# Patient Record
Sex: Female | Born: 1980
Health system: Southern US, Community
[De-identification: ages and names within clinical notes are randomized; demographics above are authoritative.]

## PROBLEM LIST (undated history)

## (undated) DIAGNOSIS — E039 Hypothyroidism, unspecified: Secondary | ICD-10-CM

## (undated) DIAGNOSIS — E119 Type 2 diabetes mellitus without complications: Secondary | ICD-10-CM

## (undated) DIAGNOSIS — N390 Urinary tract infection, site not specified: Secondary | ICD-10-CM

## (undated) DIAGNOSIS — B977 Papillomavirus as the cause of diseases classified elsewhere: Secondary | ICD-10-CM

## (undated) DIAGNOSIS — G4733 Obstructive sleep apnea (adult) (pediatric): Secondary | ICD-10-CM

## (undated) DIAGNOSIS — F4310 Post-traumatic stress disorder, unspecified: Secondary | ICD-10-CM

## (undated) DIAGNOSIS — F329 Major depressive disorder, single episode, unspecified: Secondary | ICD-10-CM

## (undated) DIAGNOSIS — N760 Acute vaginitis: Secondary | ICD-10-CM

## (undated) DIAGNOSIS — Z9989 Dependence on other enabling machines and devices: Secondary | ICD-10-CM

## (undated) DIAGNOSIS — B9689 Other specified bacterial agents as the cause of diseases classified elsewhere: Secondary | ICD-10-CM

## (undated) DIAGNOSIS — I10 Essential (primary) hypertension: Secondary | ICD-10-CM

## (undated) DIAGNOSIS — F32A Depression, unspecified: Secondary | ICD-10-CM

## (undated) DIAGNOSIS — A749 Chlamydial infection, unspecified: Secondary | ICD-10-CM

## (undated) HISTORY — PX: TONSILLECTOMY: SUR1361

## (undated) HISTORY — PX: WISDOM TOOTH EXTRACTION: SHX21

## (undated) HISTORY — PX: MOLE REMOVAL: SHX2046

---

## 2007-03-23 ENCOUNTER — Emergency Department (HOSPITAL_COMMUNITY): Admission: EM | Admit: 2007-03-23 | Discharge: 2007-03-23 | Payer: Self-pay | Admitting: Family Medicine

## 2007-06-10 ENCOUNTER — Emergency Department (HOSPITAL_COMMUNITY): Admission: EM | Admit: 2007-06-10 | Discharge: 2007-06-10 | Payer: Self-pay | Admitting: Emergency Medicine

## 2008-02-11 ENCOUNTER — Emergency Department (HOSPITAL_COMMUNITY): Admission: EM | Admit: 2008-02-11 | Discharge: 2008-02-11 | Payer: Self-pay | Admitting: Emergency Medicine

## 2008-02-17 ENCOUNTER — Emergency Department (HOSPITAL_COMMUNITY): Admission: EM | Admit: 2008-02-17 | Discharge: 2008-02-17 | Payer: Self-pay | Admitting: Emergency Medicine

## 2008-03-20 ENCOUNTER — Emergency Department (HOSPITAL_COMMUNITY): Admission: EM | Admit: 2008-03-20 | Discharge: 2008-03-20 | Payer: Self-pay | Admitting: Family Medicine

## 2008-04-07 ENCOUNTER — Inpatient Hospital Stay (HOSPITAL_COMMUNITY): Admission: EM | Admit: 2008-04-07 | Discharge: 2008-04-09 | Payer: Self-pay | Admitting: *Deleted

## 2008-04-07 ENCOUNTER — Emergency Department (HOSPITAL_COMMUNITY): Admission: EM | Admit: 2008-04-07 | Discharge: 2008-04-07 | Payer: Self-pay | Admitting: Emergency Medicine

## 2008-04-07 ENCOUNTER — Ambulatory Visit: Payer: Self-pay | Admitting: *Deleted

## 2008-07-15 ENCOUNTER — Emergency Department (HOSPITAL_COMMUNITY): Admission: EM | Admit: 2008-07-15 | Discharge: 2008-07-15 | Payer: Self-pay | Admitting: Emergency Medicine

## 2008-10-15 ENCOUNTER — Emergency Department (HOSPITAL_COMMUNITY): Admission: EM | Admit: 2008-10-15 | Discharge: 2008-10-15 | Payer: Self-pay | Admitting: Family Medicine

## 2008-12-11 ENCOUNTER — Other Ambulatory Visit (HOSPITAL_COMMUNITY): Admission: RE | Admit: 2008-12-11 | Discharge: 2008-12-19 | Payer: Self-pay | Admitting: Psychiatry

## 2008-12-12 ENCOUNTER — Ambulatory Visit: Payer: Self-pay | Admitting: Psychiatry

## 2008-12-25 ENCOUNTER — Emergency Department (HOSPITAL_COMMUNITY): Admission: EM | Admit: 2008-12-25 | Discharge: 2008-12-26 | Payer: Self-pay | Admitting: Emergency Medicine

## 2009-01-27 ENCOUNTER — Emergency Department (HOSPITAL_COMMUNITY): Admission: EM | Admit: 2009-01-27 | Discharge: 2009-01-27 | Payer: Self-pay | Admitting: Emergency Medicine

## 2009-02-18 ENCOUNTER — Emergency Department (HOSPITAL_COMMUNITY): Admission: EM | Admit: 2009-02-18 | Discharge: 2009-02-18 | Payer: Self-pay | Admitting: Family Medicine

## 2009-02-21 ENCOUNTER — Emergency Department (HOSPITAL_COMMUNITY): Admission: EM | Admit: 2009-02-21 | Discharge: 2009-02-22 | Payer: Self-pay | Admitting: Emergency Medicine

## 2009-03-28 ENCOUNTER — Emergency Department (HOSPITAL_COMMUNITY): Admission: EM | Admit: 2009-03-28 | Discharge: 2009-03-28 | Payer: Self-pay | Admitting: Emergency Medicine

## 2010-10-10 LAB — GLUCOSE, CAPILLARY: Glucose-Capillary: 90 mg/dL (ref 70–99)

## 2010-10-11 LAB — URINALYSIS, ROUTINE W REFLEX MICROSCOPIC
Bilirubin Urine: NEGATIVE
Glucose, UA: NEGATIVE mg/dL
Hgb urine dipstick: NEGATIVE
Ketones, ur: NEGATIVE mg/dL
Protein, ur: NEGATIVE mg/dL

## 2010-10-11 LAB — COMPREHENSIVE METABOLIC PANEL
ALT: 17 U/L (ref 0–35)
AST: 19 U/L (ref 0–37)
Alkaline Phosphatase: 56 U/L (ref 39–117)
CO2: 27 mEq/L (ref 19–32)
Calcium: 9 mg/dL (ref 8.4–10.5)
GFR calc Af Amer: >60 mL/min (ref >60)
Potassium: 3.7 mEq/L (ref 3.5–5.1)
Sodium: 139 mEq/L (ref 135–145)
Total Protein: 7.1 g/dL (ref 6.0–8.3)

## 2010-10-11 LAB — DIFFERENTIAL
Eosinophils Absolute: 0 10*3/uL (ref 0.0–0.7)
Eosinophils Relative: 0 % (ref 0–5)
Lymphs Abs: 2 10*3/uL (ref 0.7–4.0)
Monocytes Relative: 10 % (ref 3–12)

## 2010-10-11 LAB — URINE MICROSCOPIC-ADD ON

## 2010-10-11 LAB — POCT URINALYSIS DIP (DEVICE)
Bilirubin Urine: NEGATIVE
Glucose, UA: NEGATIVE mg/dL
Nitrite: NEGATIVE
Urobilinogen, UA: 0.2 mg/dL (ref 0.0–1.0)

## 2010-10-11 LAB — URINE CULTURE: Colony Count: >=100000

## 2010-10-11 LAB — POCT I-STAT, CHEM 8
Creatinine, Ser: 0.7 mg/dL (ref 0.4–1.2)
HCT: 40 % (ref 36.0–46.0)
Hemoglobin: 13.6 g/dL (ref 12.0–15.0)
Potassium: 3.6 mEq/L (ref 3.5–5.1)
Sodium: 138 mEq/L (ref 135–145)
TCO2: 24 mmol/L (ref 0–100)

## 2010-10-11 LAB — POCT CARDIAC MARKERS: CKMB, poc: <1 ng/mL — ABNORMAL LOW (ref 1.0–8.0)

## 2010-10-11 LAB — CBC
MCHC: 33.4 g/dL (ref 30.0–36.0)
RBC: 4.32 MIL/uL (ref 3.87–5.11)
RDW: 12.8 % (ref 11.5–15.5)

## 2010-10-12 LAB — URINALYSIS, ROUTINE W REFLEX MICROSCOPIC
Glucose, UA: NEGATIVE mg/dL
Protein, ur: NEGATIVE mg/dL
pH: 5.5 (ref 5.0–8.0)

## 2010-10-12 LAB — URINE MICROSCOPIC-ADD ON

## 2010-10-12 LAB — URINE CULTURE: Colony Count: 50000

## 2010-10-13 LAB — POCT I-STAT, CHEM 8
Calcium, Ion: 1.18 mmol/L (ref 1.12–1.32)
HCT: 39 % (ref 36.0–46.0)
TCO2: 22 mmol/L (ref 0–100)

## 2010-10-13 LAB — URINALYSIS, ROUTINE W REFLEX MICROSCOPIC
Ketones, ur: NEGATIVE mg/dL
Nitrite: NEGATIVE
Protein, ur: NEGATIVE mg/dL
pH: 5 (ref 5.0–8.0)

## 2010-10-13 LAB — CBC
HCT: 37.1 % (ref 36.0–46.0)
MCHC: 32.6 g/dL (ref 30.0–36.0)
MCV: 82.9 fL (ref 78.0–100.0)
Platelets: 343 10*3/uL (ref 150–400)
RDW: 12.3 % (ref 11.5–15.5)

## 2010-10-13 LAB — POCT PREGNANCY, URINE: Preg Test, Ur: NEGATIVE

## 2010-10-13 LAB — URINE CULTURE

## 2010-10-13 LAB — DIFFERENTIAL
Basophils Absolute: 0 10*3/uL (ref 0.0–0.1)
Eosinophils Absolute: 0.1 10*3/uL (ref 0.0–0.7)
Eosinophils Relative: 1 % (ref 0–5)

## 2010-10-13 LAB — GLUCOSE, CAPILLARY: Glucose-Capillary: 88 mg/dL (ref 70–99)

## 2010-10-15 LAB — POCT URINALYSIS DIP (DEVICE)
Ketones, ur: NEGATIVE mg/dL
Protein, ur: NEGATIVE mg/dL
Specific Gravity, Urine: >=1.03 (ref 1.005–1.030)
Urobilinogen, UA: 0.2 mg/dL (ref 0.0–1.0)

## 2010-10-15 LAB — POCT PREGNANCY, URINE: Preg Test, Ur: NEGATIVE

## 2010-10-20 LAB — URINALYSIS, ROUTINE W REFLEX MICROSCOPIC
Glucose, UA: NEGATIVE mg/dL
Hgb urine dipstick: NEGATIVE
Ketones, ur: NEGATIVE mg/dL
pH: 6.5 (ref 5.0–8.0)

## 2010-10-20 LAB — CBC
MCHC: 31.9 g/dL (ref 30.0–36.0)
RBC: 4.55 MIL/uL (ref 3.87–5.11)
RDW: 12.6 % (ref 11.5–15.5)

## 2010-10-20 LAB — DIFFERENTIAL
Eosinophils Absolute: 0 10*3/uL (ref 0.0–0.7)
Eosinophils Relative: 0 % (ref 0–5)
Lymphs Abs: 1.8 10*3/uL (ref 0.7–4.0)
Monocytes Absolute: 0.5 10*3/uL (ref 0.1–1.0)
Monocytes Relative: 8 % (ref 3–12)

## 2010-10-20 LAB — COMPREHENSIVE METABOLIC PANEL
ALT: 22 U/L (ref 0–35)
AST: 17 U/L (ref 0–37)
Albumin: 3.8 g/dL (ref 3.5–5.2)
Calcium: 9.3 mg/dL (ref 8.4–10.5)
GFR calc Af Amer: >60 mL/min (ref >60)
Potassium: 3.9 mEq/L (ref 3.5–5.1)
Sodium: 143 mEq/L (ref 135–145)
Total Protein: 6.9 g/dL (ref 6.0–8.3)

## 2010-10-20 LAB — URINE MICROSCOPIC-ADD ON

## 2010-10-20 LAB — POCT PREGNANCY, URINE: Preg Test, Ur: NEGATIVE
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
## 2011-04-03 LAB — POCT URINALYSIS DIP (DEVICE)
Bilirubin Urine: NEGATIVE
Bilirubin Urine: NEGATIVE
Glucose, UA: NEGATIVE
Hgb urine dipstick: NEGATIVE
Ketones, ur: NEGATIVE
Ketones, ur: NEGATIVE
Nitrite: NEGATIVE
Operator id: 29721
Protein, ur: NEGATIVE
Specific Gravity, Urine: 1.02
Specific Gravity, Urine: 1.025
Urobilinogen, UA: 1
pH: 6
pH: 6.5

## 2011-04-03 LAB — POCT PREGNANCY, URINE: Preg Test, Ur: NEGATIVE

## 2011-04-06 LAB — POCT URINALYSIS DIP (DEVICE)
Bilirubin Urine: NEGATIVE
Glucose, UA: NEGATIVE
Hgb urine dipstick: NEGATIVE
Nitrite: NEGATIVE

## 2011-04-07 LAB — COMPREHENSIVE METABOLIC PANEL
ALT: 14
AST: 17
CO2: 26
Chloride: 108
GFR calc Af Amer: >60
GFR calc non Af Amer: >60
Sodium: 141
Total Bilirubin: 0.9

## 2011-04-07 LAB — URINALYSIS, ROUTINE W REFLEX MICROSCOPIC
Hgb urine dipstick: NEGATIVE
Ketones, ur: NEGATIVE
Nitrite: NEGATIVE
Nitrite: NEGATIVE
Specific Gravity, Urine: 1.021
Specific Gravity, Urine: 1.022
Urobilinogen, UA: 0.2
Urobilinogen, UA: 0.2
pH: 5

## 2011-04-07 LAB — URINE MICROSCOPIC-ADD ON

## 2011-04-07 LAB — RAPID URINE DRUG SCREEN, HOSP PERFORMED: Benzodiazepines: NOT DETECTED

## 2011-04-07 LAB — PREGNANCY, URINE: Preg Test, Ur: NEGATIVE

## 2011-04-13 LAB — COMPREHENSIVE METABOLIC PANEL
Alkaline Phosphatase: 59
BUN: 6
Chloride: 106
Glucose, Bld: 107 — ABNORMAL HIGH
Potassium: 3.9
Total Bilirubin: 1
Total Protein: 7.1

## 2011-04-13 LAB — DIFFERENTIAL
Basophils Absolute: 0
Basophils Relative: 0
Neutro Abs: 5.6
Neutrophils Relative %: 81 — ABNORMAL HIGH

## 2011-04-13 LAB — URINALYSIS, ROUTINE W REFLEX MICROSCOPIC
Protein, ur: NEGATIVE
Urobilinogen, UA: 0.2

## 2011-04-13 LAB — CBC
HCT: 37.5
Hemoglobin: 12.4
RDW: 12.3

## 2011-04-13 LAB — LIPASE, BLOOD: Lipase: 19

## 2011-04-13 LAB — URINE MICROSCOPIC-ADD ON

## 2014-11-23 ENCOUNTER — Encounter (HOSPITAL_COMMUNITY): Payer: Self-pay | Admitting: *Deleted

## 2014-11-23 ENCOUNTER — Emergency Department (HOSPITAL_COMMUNITY)
Admission: EM | Admit: 2014-11-23 | Discharge: 2014-11-23 | Disposition: A | Discharge status: Home / Self Care | Payer: Self-pay | Attending: Emergency Medicine | Admitting: Emergency Medicine

## 2014-11-23 ENCOUNTER — Inpatient Hospital Stay (HOSPITAL_COMMUNITY)
Admission: AD | Admit: 2014-11-23 | Discharge: 2014-11-28 | DRG: 885 | Disposition: A | Discharge status: Home / Self Care | Payer: Federal, State, Local not specified - Other | Source: Intra-hospital | Attending: Psychiatry | Admitting: Psychiatry

## 2014-11-23 DIAGNOSIS — F4310 Post-traumatic stress disorder, unspecified: Secondary | ICD-10-CM | POA: Diagnosis present

## 2014-11-23 DIAGNOSIS — E119 Type 2 diabetes mellitus without complications: Secondary | ICD-10-CM | POA: Diagnosis present

## 2014-11-23 DIAGNOSIS — R45851 Suicidal ideations: Secondary | ICD-10-CM | POA: Diagnosis not present

## 2014-11-23 DIAGNOSIS — F314 Bipolar disorder, current episode depressed, severe, without psychotic features: Principal | ICD-10-CM | POA: Diagnosis present

## 2014-11-23 DIAGNOSIS — I10 Essential (primary) hypertension: Secondary | ICD-10-CM | POA: Diagnosis present

## 2014-11-23 DIAGNOSIS — F3113 Bipolar disorder, current episode manic without psychotic features, severe: Secondary | ICD-10-CM | POA: Diagnosis present

## 2014-11-23 DIAGNOSIS — E669 Obesity, unspecified: Secondary | ICD-10-CM | POA: Diagnosis present

## 2014-11-23 DIAGNOSIS — E039 Hypothyroidism, unspecified: Secondary | ICD-10-CM | POA: Diagnosis present

## 2014-11-23 DIAGNOSIS — Z87440 Personal history of urinary (tract) infections: Secondary | ICD-10-CM | POA: Diagnosis not present

## 2014-11-23 DIAGNOSIS — Z6841 Body Mass Index (BMI) 40.0 and over, adult: Secondary | ICD-10-CM | POA: Diagnosis not present

## 2014-11-23 DIAGNOSIS — F329 Major depressive disorder, single episode, unspecified: Secondary | ICD-10-CM | POA: Insufficient documentation

## 2014-11-23 DIAGNOSIS — Z79899 Other long term (current) drug therapy: Secondary | ICD-10-CM | POA: Insufficient documentation

## 2014-11-23 DIAGNOSIS — Z3202 Encounter for pregnancy test, result negative: Secondary | ICD-10-CM | POA: Insufficient documentation

## 2014-11-23 HISTORY — DX: Hypothyroidism, unspecified: E03.9

## 2014-11-23 HISTORY — DX: Post-traumatic stress disorder, unspecified: F43.10

## 2014-11-23 HISTORY — DX: Essential (primary) hypertension: I10

## 2014-11-23 HISTORY — DX: Type 2 diabetes mellitus without complications: E11.9

## 2014-11-23 HISTORY — DX: Depression, unspecified: F32.A

## 2014-11-23 HISTORY — DX: Major depressive disorder, single episode, unspecified: F32.9

## 2014-11-23 LAB — COMPREHENSIVE METABOLIC PANEL
ALBUMIN: 4.3 g/dL (ref 3.5–5.0)
ALT: 20 U/L (ref 14–54)
AST: 18 U/L (ref 15–41)
Alkaline Phosphatase: 78 U/L (ref 38–126)
Anion gap: 10 (ref 5–15)
BUN: 8 mg/dL (ref 6–20)
CALCIUM: 9.1 mg/dL (ref 8.9–10.3)
CO2: 25 mmol/L (ref 22–32)
CREATININE: 0.61 mg/dL (ref 0.44–1.00)
Chloride: 106 mmol/L (ref 101–111)
GFR calc Af Amer: >60 mL/min (ref >60)
GFR calc non Af Amer: >60 mL/min (ref >60)
Glucose, Bld: 113 mg/dL — ABNORMAL HIGH (ref 65–99)
Potassium: 3.6 mmol/L (ref 3.5–5.1)
SODIUM: 141 mmol/L (ref 135–145)
TOTAL PROTEIN: 8.1 g/dL (ref 6.5–8.1)
Total Bilirubin: 0.5 mg/dL (ref 0.3–1.2)

## 2014-11-23 LAB — RAPID URINE DRUG SCREEN, HOSP PERFORMED
Amphetamines: NOT DETECTED
BENZODIAZEPINES: NOT DETECTED
Barbiturates: NOT DETECTED
COCAINE: NOT DETECTED
OPIATES: NOT DETECTED
Tetrahydrocannabinol: NOT DETECTED

## 2014-11-23 LAB — CBG MONITORING, ED: Glucose-Capillary: 101 mg/dL — ABNORMAL HIGH (ref 65–99)

## 2014-11-23 LAB — ACETAMINOPHEN LEVEL: Acetaminophen (Tylenol), Serum: <10 ug/mL — ABNORMAL LOW (ref 10–30)

## 2014-11-23 LAB — CBC WITH DIFFERENTIAL/PLATELET
BASOS ABS: 0 10*3/uL (ref 0.0–0.1)
Basophils Relative: 0 % (ref 0–1)
EOS PCT: 1 % (ref 0–5)
Eosinophils Absolute: 0 10*3/uL (ref 0.0–0.7)
HCT: 40.6 % (ref 36.0–46.0)
Hemoglobin: 13 g/dL (ref 12.0–15.0)
LYMPHS ABS: 2.4 10*3/uL (ref 0.7–4.0)
LYMPHS PCT: 39 % (ref 12–46)
MCH: 26.3 pg (ref 26.0–34.0)
MCHC: 32 g/dL (ref 30.0–36.0)
MCV: 82 fL (ref 78.0–100.0)
Monocytes Absolute: 0.5 10*3/uL (ref 0.1–1.0)
Monocytes Relative: 7 % (ref 3–12)
NEUTROS ABS: 3.2 10*3/uL (ref 1.7–7.7)
Neutrophils Relative %: 53 % (ref 43–77)
PLATELETS: 414 10*3/uL — AB (ref 150–400)
RBC: 4.95 MIL/uL (ref 3.87–5.11)
RDW: 12.8 % (ref 11.5–15.5)
WBC: 6.1 10*3/uL (ref 4.0–10.5)

## 2014-11-23 LAB — ETHANOL: Alcohol, Ethyl (B): <5 mg/dL (ref <5)

## 2014-11-23 LAB — POC URINE PREG, ED: Preg Test, Ur: NEGATIVE

## 2014-11-23 MED ORDER — LEVOTHYROXINE SODIUM 50 MCG PO TABS
50.0000 ug | ORAL_TABLET | Freq: Every day | ORAL | Status: DC
  Administered 2014-11-23: 50 ug via ORAL
  Filled 2014-11-23 (×2): qty 1

## 2014-11-23 MED ORDER — LISINOPRIL 40 MG PO TABS
40.0000 mg | ORAL_TABLET | Freq: Every day | ORAL | Status: DC
  Administered 2014-11-23: 40 mg via ORAL
  Filled 2014-11-23: qty 1

## 2014-11-23 MED ORDER — ONDANSETRON HCL 4 MG PO TABS: 4.0000 mg | ORAL_TABLET | Freq: Three times a day (TID) | ORAL | Status: DC | PRN

## 2014-11-23 MED ORDER — LURASIDONE HCL 80 MG PO TABS
80.0000 mg | ORAL_TABLET | Freq: Every day | ORAL | Status: DC
  Administered 2014-11-23: 80 mg via ORAL
  Filled 2014-11-23 (×2): qty 1

## 2014-11-23 MED ORDER — ACETAMINOPHEN 325 MG PO TABS: 650.0000 mg | ORAL_TABLET | ORAL | Status: DC | PRN

## 2014-11-23 MED ORDER — LORAZEPAM 1 MG PO TABS: 1.0000 mg | ORAL_TABLET | Freq: Three times a day (TID) | ORAL | Status: DC | PRN

## 2014-11-23 MED ORDER — TRAZODONE HCL 100 MG PO TABS
100.0000 mg | ORAL_TABLET | Freq: Every day | ORAL | Status: DC
  Administered 2014-11-23: 100 mg via ORAL
  Filled 2014-11-23: qty 1

## 2014-11-23 MED ORDER — AMLODIPINE BESYLATE 10 MG PO TABS
10.0000 mg | ORAL_TABLET | Freq: Every day | ORAL | Status: DC
  Administered 2014-11-23: 10 mg via ORAL
  Filled 2014-11-23: qty 1

## 2014-11-23 MED ORDER — ZOLPIDEM TARTRATE 5 MG PO TABS: 5.0000 mg | ORAL_TABLET | Freq: Every evening | ORAL | Status: DC | PRN

## 2014-11-23 MED ORDER — ESCITALOPRAM OXALATE 10 MG PO TABS
20.0000 mg | ORAL_TABLET | Freq: Every day | ORAL | Status: DC
  Administered 2014-11-23: 20 mg via ORAL
  Filled 2014-11-23: qty 2

## 2014-11-23 MED ORDER — IBUPROFEN 200 MG PO TABS: 600.0000 mg | ORAL_TABLET | Freq: Three times a day (TID) | ORAL | Status: DC | PRN

## 2014-11-23 MED ORDER — ALUM & MAG HYDROXIDE-SIMETH 200-200-20 MG/5ML PO SUSP: 30.0000 mL | ORAL | Status: DC | PRN

## 2014-11-23 NOTE — ED Notes (Signed)
Telepsych in progress. 

## 2014-11-23 NOTE — ED Notes (Signed)
Pt. Noted in room. No complaints or concerns voiced. No distress or abnormal behavior noted. Will continue to monitor with security cameras. Q 15 minute rounds continue. 

## 2014-11-23 NOTE — ED Notes (Signed)
Pt. Noted sleeping in room. No complaints or concerns voiced. No distress or abnormal behavior noted. Will continue to monitor with security cameras. Q 15 minute rounds continue. 

## 2014-11-23 NOTE — ED Notes (Signed)
Report received from [facility]. Pt. Alert and oriented in no distress denies SI, HI, AVH and pain.  Pt. Instructed to come to me with problems or concerns.Will continue to monitor for safety via security cameras and Q 15 minute checks.

## 2014-11-23 NOTE — ED Notes (Signed)
Belongings sent back with staff.
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
## 2014-11-23 NOTE — ED Notes (Signed)
Patient transferred from [place].  Came onto the unit ambulatory.  She is sad, depressed, but not tearful.  States she was evicted from her apartment in [place] and came back to [place] hoping to find a place to live.  She has been staying with a friend, but they have been arguing recently.  States she has applied for disability and is waiting to hear from them.  States she has had thoughts of jumping off a bridge.  Was on Lexapro in the past, states it did help her mood some, but she feels that most days she was just going through the motions.  States that the only things she enjoys are eating and sleeping.  Patient is morbidly obese.  Has not been attending to hygiene in the past few weeks and is only showering a couple times a week.  She is very pleasant.  She was oriented to the unit and to the unit rules.  She was given a sandwich and a drink.

## 2014-11-23 NOTE — BH Assessment (Signed)
[place] Assessment Progress Note     Called and scheduled tele assessment with this clinician.  Shaune Pascal, MS, [facility] Therapeutic Triage Specialist [facility]

## 2014-11-23 NOTE — ED Notes (Signed)
Patient just recently moved from [place] 3 weeks ago and she has been off of all of her medications. She is also homeless. She was staying with a friend who no longer wants he there and she is thinking about killing herself by jumping off of a overpass or bridge. Patient called the police herself and asked for help.

## 2014-11-23 NOTE — ED Provider Notes (Signed)
CSN: [id number]     Arrival date & time 11/23/14  1430 History   First MD Initiated Contact with Patient 11/23/14 1534     Chief Complaint  Patient presents with  . Suicidal  . Manic Behavior     (Consider location/radiation/quality/duration/timing/severity/associated sxs/prior Treatment) HPI Comments: Renee Steele is a 34 y.o. female with a PMHx of HTN, depression, bipolar disorder, PTSD, DM2, and homelessness, who presents to the ED with complaints of suicidal ideation with a plan to jump off of a bridge. She states that she has been feeling depressed for quite some time, and has been off of all of her medications including her psychiatric medications for 3 weeks. For approximately 2 weeks she has felt "woozy" which she attributes to not being on her medications, but denies that this feeling is lightheadedness or dizziness. She states that this feels the same as when she previously had been off of her medications. She reports that today she called police for help with her suicidal ideations because her friend whom she was staying with wants to kick her out and told her to come here. She denies any homicidal ideations or auditory/visual hallucinations. Denies any recent fevers or chills, headache, vision changes, syncope, chest pain, shortness of breath, abdominal pain, nausea, vomiting, diarrhea, constipation, dysuria, hematuria, vaginal bleeding or discharge, numbness, tingling, or weakness. Denies any alcohol use, illicit drug use, or smoking. Last menstrual period was 10/01/14, but she states that she is typically irregular.   She cannot recall the doses of her medications, but states she's on amlodipine, lisinopril, lexapro, latuda, and synthroid. She uses [facility] at [place] in [place], where she's from.   Patient is a 34 y.o. female presenting with mental health disorder. The history is provided by the patient. No language interpreter was used.  Mental Health  Problem Presenting symptoms: depression and suicidal thoughts   Presenting symptoms: no hallucinations and no homicidal ideas   Patient accompanied by:  Law enforcement Onset quality:  Gradual Duration:  3 weeks Timing:  Constant Progression:  Worsening Chronicity:  Recurrent Context: noncompliance   Treatment compliance:  None of the time Time since last psychoactive medication taken:  3 weeks Relieved by:  None tried Worsened by:  Nothing tried Ineffective treatments:  None tried Associated symptoms: no abdominal pain, no chest pain and no headaches   Risk factors: hx of mental illness     Past Medical History  Diagnosis Date  . Depression   . Hypertension   . Post traumatic stress disorder (PTSD)   . Diabetes mellitus without complication    History reviewed. No pertinent past surgical history. No family history on file. History  Substance Use Topics  . Smoking status: Never Smoker   . Smokeless tobacco: Not on file  . Alcohol Use: No   OB History    No data available     Review of Systems  Constitutional: Negative for fever and chills.  Eyes: Negative for visual disturbance.  Respiratory: Negative for shortness of breath.   Cardiovascular: Negative for chest pain.  Gastrointestinal: Negative for nausea, vomiting, abdominal pain, diarrhea and constipation.  Genitourinary: Negative for dysuria, hematuria, vaginal bleeding and vaginal discharge.  Musculoskeletal: Negative for myalgias and arthralgias.  Skin: Negative for color change.  Allergic/Immunologic: Positive for immunocompromised state (diabetic).  Neurological: Negative for syncope, weakness, numbness and headaches.  Psychiatric/Behavioral: Positive for suicidal ideas. Negative for homicidal ideas, hallucinations and confusion.   10 Systems reviewed and are negative for acute change  except as noted in the HPI.    Allergies  Review of patient's allergies indicates no known allergies.  Home Medications    Prior to Admission medications   Medication Sig Start Date End Date Taking? Authorizing Provider  escitalopram (LEXAPRO) 20 MG tablet Take 20 mg by mouth daily.   Yes Historical Provider, MD  levothyroxine (SYNTHROID, LEVOTHROID) 50 MCG tablet Take 50 mcg by mouth daily before breakfast.   Yes Historical Provider, MD  traZODone (DESYREL) 100 MG tablet Take 100 mg by mouth at bedtime.   Yes Historical Provider, MD   BP 181/101 mmHg  Pulse 99  Temp(Src) 98.6 F (37 C) (Oral)  Resp 20  SpO2 100%  LMP 10/01/2014 Physical Exam  Constitutional: She is oriented to person, place, and time. Vital signs are normal. She appears well-developed and well-nourished.  Non-toxic appearance. No distress.  Afebrile, nontoxic, NAD, morbidly obese  HENT:  Head: Normocephalic and atraumatic.  Mouth/Throat: Oropharynx is clear and moist and mucous membranes are normal.  Eyes: Conjunctivae and EOM are normal. Pupils are equal, round, and reactive to light. Right eye exhibits no discharge. Left eye exhibits no discharge.  PERRL, EOMI, no nystagmus, no visual field deficits   Neck: Normal range of motion. Neck supple.  Cardiovascular: Normal rate, regular rhythm, normal heart sounds and intact distal pulses.  Exam reveals no gallop and no friction rub.   No murmur heard. RRR, nl s1/s2, no m/r/g, distal pulses intact, no pedal edema   Pulmonary/Chest: Effort normal and breath sounds normal. No respiratory distress. She has no decreased breath sounds. She has no wheezes. She has no rhonchi. She has no rales.  Abdominal: Soft. Normal appearance and bowel sounds are normal. She exhibits no distension. There is no tenderness. There is no rigidity, no rebound, no guarding, no CVA tenderness, no tenderness at McBurney's point and negative Murphy's sign.  Musculoskeletal: Normal range of motion.  MAE x4 Strength and sensation grossly intact Distal pulses intact No pedal edema, neg homan's bilaterally Gait steady   Neurological: She is alert and oriented to person, place, and time. She has normal strength. No cranial nerve deficit or sensory deficit. Coordination and gait normal. GCS eye subscore is 4. GCS verbal subscore is 5. GCS motor subscore is 6.  CN 2-12 grossly intact A&O x4 GCS 15 Sensation and strength intact Gait nonataxic Coordination WNL  Skin: Skin is warm, dry and intact. No rash noted.  Psychiatric: She has a normal mood and affect.  Nursing note and vitals reviewed.   ED Course  Procedures (including critical care time) Labs Review Labs Reviewed  CBC WITH DIFFERENTIAL/PLATELET - Abnormal; Notable for the following:    Platelets 414 (*)    All other components within normal limits  COMPREHENSIVE METABOLIC PANEL - Abnormal; Notable for the following:    Glucose, Bld 113 (*)    All other components within normal limits  ACETAMINOPHEN LEVEL - Abnormal; Notable for the following:    Acetaminophen (Tylenol), Serum <10 (*)    All other components within normal limits  URINE RAPID DRUG SCREEN (HOSP PERFORMED)  ETHANOL  POC URINE PREG, ED    Imaging Review No results found.   EKG Interpretation None      MDM   Final diagnoses:  Suicidal ideation  HTN (hypertension), benign    34 y.o. female here with suicidal ideations with a plan, has been off all her medications for 3 weeks. She has noted be hypertensive but without any symptoms at  this time. Feels "woozy" from not being on meds, no HA or focal neuro symptoms, nonfocal neuro exam, doubt need for head CT. Here voluntarily. Will get screening labs, med rec for doses, and TTS consultation.   4:20 PM Labs all unremarkable. Home meds reordered. Pt is medically cleared, HTN improved upon re-check. Please see [facility] notes for further documentation of care.  BP 135/67 mmHg  Pulse 83  Temp(Src) 97.4 F (36.3 C) (Oral)  Resp 20  SpO2 96%  LMP 10/01/2014  Meds ordered this encounter  Medications  . traZODone (DESYREL)  100 MG tablet    Sig: Take 100 mg by mouth at bedtime.  Marland Kitchen levothyroxine (SYNTHROID, LEVOTHROID) 50 MCG tablet    Sig: Take 50 mcg by mouth daily before breakfast.  . escitalopram (LEXAPRO) 20 MG tablet    Sig: Take 20 mg by mouth daily.  Marland Kitchen alum & mag hydroxide-simeth (MAALOX/MYLANTA) 200-200-20 MG/5ML suspension 30 mL    Sig:   . ondansetron (ZOFRAN) tablet 4 mg    Sig:   . zolpidem (AMBIEN) tablet 5 mg    Sig:   . ibuprofen (ADVIL,MOTRIN) tablet 600 mg    Sig:   . acetaminophen (TYLENOL) tablet 650 mg    Sig:   . LORazepam (ATIVAN) tablet 1 mg    Sig:   . amLODipine (NORVASC) 10 MG tablet    Sig: Take 10 mg by mouth daily.  Marland Kitchen lisinopril (PRINIVIL,ZESTRIL) 40 MG tablet    Sig: Take 40 mg by mouth daily.  Marland Kitchen lurasidone (LATUDA) 80 MG TABS tablet    Sig: Take 80 mg by mouth daily with breakfast.  . Multiple Vitamins-Minerals (MULTIVITAMIN & MINERAL PO)    Sig: Take 1 tablet by mouth daily.  Marland Kitchen amLODipine (NORVASC) tablet 10 mg    Sig:   . escitalopram (LEXAPRO) tablet 20 mg    Sig:   . levothyroxine (SYNTHROID, LEVOTHROID) tablet 50 mcg    Sig:   . lisinopril (PRINIVIL,ZESTRIL) tablet 40 mg    Sig:   . lurasidone (LATUDA) tablet 80 mg    Sig:   . traZODone (DESYREL) tablet 100 mg    Sig:       Lucia Harm Camprubi-Soms, PA-C 11/23/14 1621  Sherwood Gambler, MD 11/24/14 0022

## 2014-11-23 NOTE — ED Notes (Signed)
Bed: WA27 Expected date:  Expected time:  Means of arrival:  Comments: 

## 2014-11-24 ENCOUNTER — Encounter (HOSPITAL_COMMUNITY): Payer: Self-pay | Admitting: *Deleted

## 2014-11-24 DIAGNOSIS — F314 Bipolar disorder, current episode depressed, severe, without psychotic features: Secondary | ICD-10-CM | POA: Diagnosis present

## 2014-11-24 DIAGNOSIS — F3113 Bipolar disorder, current episode manic without psychotic features, severe: Secondary | ICD-10-CM | POA: Diagnosis present

## 2014-11-24 LAB — GLUCOSE, CAPILLARY: Glucose-Capillary: 107 mg/dL — ABNORMAL HIGH (ref 65–99)

## 2014-11-24 LAB — LIPID PANEL
Cholesterol: 201 mg/dL — ABNORMAL HIGH (ref 0–200)
HDL: 37 mg/dL — ABNORMAL LOW (ref >40)
LDL Cholesterol: 128 mg/dL — ABNORMAL HIGH (ref 0–99)
Total CHOL/HDL Ratio: 5.4 RATIO
Triglycerides: 181 mg/dL — ABNORMAL HIGH (ref <150)
VLDL: 36 mg/dL (ref 0–40)

## 2014-11-24 LAB — TSH: TSH: 2.551 u[IU]/mL (ref 0.350–4.500)

## 2014-11-24 MED ORDER — LURASIDONE HCL 80 MG PO TABS
80.0000 mg | ORAL_TABLET | Freq: Every day | ORAL | Status: DC
  Administered 2014-11-25 – 2014-11-27 (×3): 80 mg via ORAL
  Filled 2014-11-24: qty 1
  Filled 2014-11-24: qty 14
  Filled 2014-11-24 (×3): qty 1

## 2014-11-24 MED ORDER — ESCITALOPRAM OXALATE 20 MG PO TABS
20.0000 mg | ORAL_TABLET | Freq: Every day | ORAL | Status: DC
  Administered 2014-11-24 – 2014-11-28 (×5): 20 mg via ORAL
  Filled 2014-11-24: qty 14
  Filled 2014-11-24 (×6): qty 1

## 2014-11-24 MED ORDER — MAGNESIUM HYDROXIDE 400 MG/5ML PO SUSP: 30.0000 mL | Freq: Every day | ORAL | Status: DC | PRN

## 2014-11-24 MED ORDER — HYDROXYZINE HCL 25 MG PO TABS
25.0000 mg | ORAL_TABLET | Freq: Four times a day (QID) | ORAL | Status: DC | PRN
  Filled 2014-11-24: qty 20

## 2014-11-24 MED ORDER — ACETAMINOPHEN 325 MG PO TABS
650.0000 mg | ORAL_TABLET | Freq: Four times a day (QID) | ORAL | Status: DC | PRN
  Administered 2014-11-24 (×2): 650 mg via ORAL
  Filled 2014-11-24 (×2): qty 2

## 2014-11-24 MED ORDER — LORATADINE 10 MG PO TABS
10.0000 mg | ORAL_TABLET | Freq: Every day | ORAL | Status: DC
  Administered 2014-11-25 – 2014-11-28 (×4): 10 mg via ORAL
  Filled 2014-11-24 (×6): qty 1

## 2014-11-24 MED ORDER — IBUPROFEN 400 MG PO TABS
400.0000 mg | ORAL_TABLET | Freq: Four times a day (QID) | ORAL | Status: DC | PRN
  Administered 2014-11-24: 400 mg via ORAL
  Filled 2014-11-24: qty 1

## 2014-11-24 MED ORDER — AMLODIPINE BESYLATE 10 MG PO TABS
10.0000 mg | ORAL_TABLET | Freq: Every day | ORAL | Status: DC
  Administered 2014-11-24 – 2014-11-28 (×5): 10 mg via ORAL
  Filled 2014-11-24: qty 1
  Filled 2014-11-24: qty 14
  Filled 2014-11-24 (×5): qty 1

## 2014-11-24 MED ORDER — ALUM & MAG HYDROXIDE-SIMETH 200-200-20 MG/5ML PO SUSP: 30.0000 mL | ORAL | Status: DC | PRN

## 2014-11-24 MED ORDER — LURASIDONE HCL 80 MG PO TABS
80.0000 mg | ORAL_TABLET | Freq: Every day | ORAL | Status: DC
  Administered 2014-11-24: 80 mg via ORAL
  Filled 2014-11-24 (×2): qty 1

## 2014-11-24 MED ORDER — LISINOPRIL 20 MG PO TABS
40.0000 mg | ORAL_TABLET | Freq: Every day | ORAL | Status: DC
Start: 2014-11-24 — End: 2014-11-28
  Administered 2014-11-24 – 2014-11-28 (×5): 40 mg via ORAL
  Filled 2014-11-24: qty 1
  Filled 2014-11-24 (×2): qty 2
  Filled 2014-11-24: qty 1
  Filled 2014-11-24 (×2): qty 2
  Filled 2014-11-24: qty 28
  Filled 2014-11-24: qty 2

## 2014-11-24 MED ORDER — LORATADINE 10 MG PO TABS
10.0000 mg | ORAL_TABLET | Freq: Every day | ORAL | Status: DC
  Filled 2014-11-24 (×2): qty 1

## 2014-11-24 MED ORDER — TRAZODONE HCL 100 MG PO TABS
100.0000 mg | ORAL_TABLET | Freq: Every evening | ORAL | Status: DC | PRN
  Administered 2014-11-26 – 2014-11-27 (×2): 100 mg via ORAL
  Filled 2014-11-24 (×2): qty 1
  Filled 2014-11-24: qty 14

## 2014-11-24 MED ORDER — LEVOTHYROXINE SODIUM 50 MCG PO TABS
50.0000 ug | ORAL_TABLET | Freq: Every day | ORAL | Status: DC
  Administered 2014-11-24 – 2014-11-28 (×5): 50 ug via ORAL
  Filled 2014-11-24: qty 1
  Filled 2014-11-24: qty 2
  Filled 2014-11-24 (×4): qty 1
  Filled 2014-11-24: qty 14
  Filled 2014-11-24: qty 1

## 2014-11-24 NOTE — Progress Notes (Signed)
Patient ID: Renee Steele, female   DOB: 01-13-1981, 34 y.o.   MRN: [id number]  Adult Psychoeducational Group Note  Date:  11/24/2014 Time: 10:00am  Group Topic/Focus:  Making Healthy Choices:   The focus of this group is to help patients identify negative/unhealthy choices they were using prior to admission and identify positive/healthier coping strategies to replace them upon discharge.  Participation Level:  Active  Participation Quality:  Drowsy  Affect:  Flat  Cognitive:  Alert   Insight: Limited  Engagement in Group:  Engaged  Modes of Intervention:  Activity, Discussion, Education, Exploration and Support  Additional Comments:  Pt able to identify three healthy coping skills/choices to make in efforts to prevent future mental health crisis.  Elenore Rota 11/24/2014, 5:34 PM

## 2014-11-24 NOTE — Progress Notes (Signed)
Patient ID: Renee Steele, female   DOB: 1980-07-19, 34 y.o.   MRN: [id number]  Adult Psychoeducational Group Note  Date:  11/24/2014 Time: 09:30am  Group Topic/Focus:  Crisis Planning:   The purpose of this group is to help patients create a crisis plan for use upon discharge or in the future, as needed.  Participation Level:  Active  Participation Quality:  Drowsy  Affect:  Flat  Cognitive:  Alert and Oriented  Insight: Limited  Engagement in Group:  Engaged and Improving  Modes of Intervention:  Activity, Confrontation, Discussion, Education and Support  Additional Comments:  Pt able to identify trigger of his/her mental health crisis prior to coming to [facility].   Elenore Rota 11/24/2014, 5:26 PM

## 2014-11-24 NOTE — BHH Group Notes (Signed)
[place] Group Notes:  (Clinical Social Work)  11/24/2014     1:15-2:15PM  Summary of Progress/Problems:   The main focus of today's process group was to learn how to use a decisional balance exercise to move forward in the Stages of Change, which were described and discussed.  Motivational Interviewing and a worksheet were utilized to help patients explore in depth the perceived benefits and costs of unhealthy coping techniques, as well as the  benefits and costs of replacing that with a healthy coping skills.   The patient expressed that their unhealthy coping involves gambling, which she does because it is a thrill and she always thinks she is going to win.  She stated one healthy coping skill she uses a lot is music, "because it's my life."  She talked extensively during group, was insightful, supportive of others, on topic.  Type of Therapy:  Group Therapy - Process   Participation Level:  Active  Participation Quality:  Appropriate, Attentive, Sharing and Supportive  Affect:  Blunted  Cognitive:  Alert, Appropriate and Oriented  Insight:  Engaged  Engagement in Therapy:  Engaged  Modes of Intervention:  Education, Motivational Interviewing  Selmer Dominion, LCSW 11/24/2014, 5:03 PM
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
## 2014-11-24 NOTE — Progress Notes (Signed)
Patient ID: Renee Steele, female   DOB: [birth year], 34 y.o.   MRN: [id number]  Pt currently presents with a flat affect and guarded behavior. Per self inventory, pt rates depression at a 7, hopelessness 10 and anxiety 8. Pt's daily goal is to "go to all the groups" and they intend to do so by "I dunno." Pt reports fair sleep, poor concentration and a fair appetite. Pt c/o headache throughout most of the day and states "Tylenol doesn't work for me."  Pt provided with medications per providers orders. Pt's labs and vitals were monitored throughout the day. Pt supported emotionally and encouraged to express concerns and questions. Pt educated on medications and assertiveness techniques. Ibuprofen and Claritin ordered by provider.   Pt's safety ensured with 15 minute and environmental checks. Pt currently denies SI/HI and A/V hallucinations. Pt verbally agrees to seek staff if SI/HI or A/VH occurs and to consult with staff before acting on these thoughts. Pt remained in bed in between programming and meals today.

## 2014-11-24 NOTE — BHH Suicide Risk Assessment (Signed)
[place] INPATIENT:  Family/Significant Other Suicide Prevention Education  Suicide Prevention Education:  Patient Refusal for Family/Significant Other Suicide Prevention Education: The patient Renee Steele has refused to provide written consent for family/significant other to be provided Family/Significant Other Suicide Prevention Education during admission and/or prior to discharge.  Physician notified.  States she has no access to firearms.  Lysle Dingwall 11/24/2014, 5:26 PM

## 2014-11-24 NOTE — Tx Team (Signed)
Initial Interdisciplinary Treatment Plan   PATIENT STRESSORS: Financial difficulties Medication change or noncompliance Occupational concerns homeless, no insurance, no source of income   PATIENT STRENGTHS: Ability for insight Active sense of humor Average or above average intelligence Communication skills General fund of knowledge Religious Affiliation   PROBLEM LIST: Problem List/Patient Goals Date to be addressed Date deferred Reason deferred Estimated date of resolution  "talking to the case manager about assistance"  11/23/14     "getting stable on my medicine"  11/23/14                                                DISCHARGE CRITERIA:  Improved stabilization in mood, thinking, and/or behavior  PRELIMINARY DISCHARGE PLAN: Outpatient therapy Placement in alternative living arrangements  PATIENT/FAMIILY INVOLVEMENT: This treatment plan has been presented to and reviewed with the patient, Renee Steele.  The patient and family have been given the opportunity to ask questions and make suggestions.  Karie Kirks 11/24/2014, 3:27 AM
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
## 2014-11-25 DIAGNOSIS — R45851 Suicidal ideations: Secondary | ICD-10-CM

## 2014-11-25 LAB — GLUCOSE, CAPILLARY
GLUCOSE-CAPILLARY: 100 mg/dL — AB (ref 65–99)
GLUCOSE-CAPILLARY: 123 mg/dL — AB (ref 65–99)
Glucose-Capillary: 149 mg/dL — ABNORMAL HIGH (ref 65–99)
Glucose-Capillary: 109 mg/dL — ABNORMAL HIGH (ref 65–99)

## 2014-11-25 NOTE — Plan of Care (Signed)
Problem: Alteration in mood Goal: LTG-Patient reports reduction in suicidal thoughts (Patient reports reduction in suicidal thoughts and is able to verbalize a safety plan for whenever patient is feeling suicidal)  Outcome: Progressing Pt denied SI this shift.  She verbally contracted for safety.

## 2014-11-25 NOTE — BHH Group Notes (Signed)
[place] Group Notes:  (Clinical Social Work)  11/25/2014  1:15-2:15PM  Summary of Progress/Problems:   The main focus of today's process group was to   1)  discuss the importance of adding supports  2)  define health supports versus unhealthy supports  3)  identify the patient's current unhealthy supports and plan how to handle them  4)  Identify the patient's current healthy supports and plan what to add.  An emphasis was placed on using counselor, doctor, therapy groups, 12-step groups, and problem-specific support groups to expand supports.    The patient expressed full comprehension of the concepts presented, and agreed that there is a need to add more supports.  The patient stated she has "tons of friends" but she does not like to use them, because they have their own families and she does not want to intrude.  Renee Steele was not very active or invested in group today, left after awhile.  Type of Therapy:  Process Group with Motivational Interviewing  Participation Level:  Minimal  Participation Quality:  Attentive  Affect:  Blunted and Irritable  Cognitive:  Alert  Insight:  Improving  Engagement in Therapy:  Limited  Modes of Intervention:   Education, Support and Processing, Activity  Selmer Dominion, LCSW 11/25/2014

## 2014-11-25 NOTE — Progress Notes (Signed)
D:  Patient's self inventory sheet, patient has fair sleep, no sleep medication given.  Fair appetite, low energy level, poor concentration.  Rated depression 7, hopeless and anxiety 8.  Denied withdrawals.  Denied SI.  Denied physical problems.  Denied physical pain,  Goal today is to work on depression.  Plans to stay positive.  No discharge plans. A:  Medications administered per MD orders.  Emotional support and encouragement. R:  Denied SI and HI, contracts for safety.  Denied A/V hallucinations.  Safety maintained with 15 minute checks.

## 2014-11-25 NOTE — Plan of Care (Signed)
Problem: Consults Goal: Suicide Risk Patient Education (See Patient Education module for education specifics)  Outcome: Completed/Met Date Met:  11/25/14 Nurse discussed suicidal thoughts/coping skills with patient.     

## 2014-11-25 NOTE — Progress Notes (Signed)
Psychoeducational Group Note  Date:  11/25/2014 Time:  1015  Group Topic/Focus:  Making Healthy Choices:   The focus of this group is to help patients identify negative/unhealthy choices they were using prior to admission and identify positive/healthier coping strategies to replace them upon discharge.  Participation Level:  Active  Participation Quality:  Appropriate  Affect:  Appropriate  Cognitive:  Oriented  Insight:  Improving  Engagement in Group:  Engaged  Additional Comments:  Pt participated in the group, and was able to identify needs that they have. They also were able to identify areas that they needed to grow in.  Paulino Rily 11/25/2014

## 2014-11-25 NOTE — Progress Notes (Signed)
[facility] MD Progress Note  11/25/2014 3:05 PM Tawnya Pujol  MRN:  [id number] Subjective:  I"m hanging in there, but not really. Says she may be a little better because she had the energy to shower today and to go to meals, this is an improvement. But I"m so tired." Principal Problem: Bipolar 1 disorder, depressed, severe Diagnosis:   Patient Active Problem List   Diagnosis Date Noted  . Bipolar 1 disorder, depressed, severe [F31.4] 11/24/2014   Total Time spent with patient: 30 minutes   Past Medical History:  Past Medical History  Diagnosis Date  . Depression   . Hypertension   . Post traumatic stress disorder (PTSD)   . Diabetes mellitus without complication   . Hypothyroidism    History reviewed. No pertinent past surgical history. Family History: History reviewed. No pertinent family history. Social History:  History  Alcohol Use No     History  Drug Use No    History   Social History  . Marital Status: Single    Spouse Name: N/A  . Number of Children: N/A  . Years of Education: N/A   Social History Main Topics  . Smoking status: Never Smoker   . Smokeless tobacco: Not on file  . Alcohol Use: No  . Drug Use: No  . Sexual Activity: Yes    Birth Control/ Protection: None   Other Topics Concern  . None   Social History Narrative   Additional History:    Sleep: Fair  Appetite:  Good   Assessment:   Musculoskeletal: Strength & Muscle Tone: within normal limits Gait & Station: normal Patient leans: normal   Psychiatric Specialty Exam: Physical Exam  Review of Systems  Psychiatric/Behavioral: Positive for depression.  All other systems reviewed and are negative.   Blood pressure 130/92, pulse 68, temperature 97.7 F (36.5 C), temperature source Oral, resp. rate 16, height 5\' 9"  (1.753 m), weight 407 lb (184.614 kg), last menstrual period 10/01/2014.Body mass index is 60.08 kg/(m^2).  General Appearance: Disheveled  Eye Contact::  Good  Speech:   Clear and Coherent  Volume:  Decreased  Mood:  Anxious and Depressed  Affect:  Congruent  Thought Process:  Goal Directed and Intact  Orientation:  Full (Time, Place, and Person)  Thought Content:  WDL  Suicidal Thoughts:  Yes.  without intent/plan  Homicidal Thoughts:  No  Memory:  Immediate;   Fair Recent;   Fair Remote;   Fair  Judgement:  Impaired  Insight:  Shallow  Psychomotor Activity:  Decreased  Concentration:  Fair  Recall:  [employer] of Knowledge:Good  Language: Good  Akathisia:  No  Handed:  Right  AIMS (if indicated):     Assets:  Communication Skills Desire for Improvement Resilience  ADL's:  Intact  Cognition: WNL  Sleep:  Number of Hours: 6.25     Current Medications: Current Facility-Administered Medications  Medication Dose Route Frequency Provider Last Rate Last Dose  . alum & mag hydroxide-simeth (MAALOX/MYLANTA) 200-200-20 MG/5ML suspension 30 mL  30 mL Oral Q4H PRN Harriet Butte, NP      . amLODipine (NORVASC) tablet 10 mg  10 mg Oral Daily Harriet Butte, NP   10 mg at 11/25/14 6195  . escitalopram (LEXAPRO) tablet 20 mg  20 mg Oral Daily Harriet Butte, NP   20 mg at 11/25/14 0932  . hydrOXYzine (ATARAX/VISTARIL) tablet 25 mg  25 mg Oral Q6H PRN Harriet Butte, NP      .  ibuprofen (ADVIL,MOTRIN) tablet 400 mg  400 mg Oral Q6H PRN Encarnacion Slates, NP   400 mg at 11/24/14 1955  . levothyroxine (SYNTHROID, LEVOTHROID) tablet 50 mcg  50 mcg Oral QAC breakfast Harriet Butte, NP   50 mcg at 11/25/14 3825  . lisinopril (PRINIVIL,ZESTRIL) tablet 40 mg  40 mg Oral Daily Harriet Butte, NP   40 mg at 11/25/14 0539  . loratadine (CLARITIN) tablet 10 mg  10 mg Oral Daily Encarnacion Slates, NP   10 mg at 11/25/14 7673  . lurasidone (LATUDA) tablet 80 mg  80 mg Oral QHS Kerrie Buffalo, NP      . magnesium hydroxide (MILK OF MAGNESIA) suspension 30 mL  30 mL Oral Daily PRN Harriet Butte, NP      . traZODone (DESYREL) tablet 100 mg  100 mg Oral QHS PRN Harriet Butte, NP        Lab Results:  Results for orders placed or performed during the hospital encounter of 11/23/14 (from the past 48 hour(s))  Lipid panel, fasting     Status: Abnormal   Collection Time: 11/24/14  6:30 AM  Result Value Ref Range   Cholesterol 201 (H) 0 - 200 mg/dL   Triglycerides 181 (H) <150 mg/dL   HDL 37 (L) >40 mg/dL   Total CHOL/HDL Ratio 5.4 RATIO   VLDL 36 0 - 40 mg/dL   LDL Cholesterol 128 (H) 0 - 99 mg/dL    Comment:        Total Cholesterol/HDL:CHD Risk Coronary Heart Disease Risk Table                     Men   Women  1/2 Average Risk   3.4   3.3  Average Risk       5.0   4.4  2 X Average Risk   9.6   7.1  3 X Average Risk  23.4   11.0        Use the calculated Patient Ratio above and the CHD Risk Table to determine the patient's CHD Risk.        ATP III CLASSIFICATION (LDL):  <100     mg/dL   Optimal  100-129  mg/dL   Near or Above                    Optimal  130-159  mg/dL   Borderline  160-189  mg/dL   High  >190     mg/dL   Very High Performed at [facility]   TSH     Status: None   Collection Time: 11/24/14  6:30 AM  Result Value Ref Range   TSH 2.551 0.350 - 4.500 uIU/mL    Comment: Performed at [facility]  Glucose, capillary     Status: Abnormal   Collection Time: 11/24/14  4:46 PM  Result Value Ref Range   Glucose-Capillary 107 (H) 65 - 99 mg/dL   Comment 1 Notify RN    Comment 2 Document in Chart   Glucose, capillary     Status: Abnormal   Collection Time: 11/25/14  6:01 AM  Result Value Ref Range   Glucose-Capillary 100 (H) 65 - 99 mg/dL  Glucose, capillary     Status: Abnormal   Collection Time: 11/25/14 11:44 AM  Result Value Ref Range   Glucose-Capillary 149 (H) 65 - 99 mg/dL   Comment 1 Notify RN    Comment  2 Document in Chart     Physical Findings: AIMS: Facial and Oral Movements Muscles of Facial Expression: None, normal Lips and Perioral Area: None, normal Jaw: None, normal Tongue:  None, normal,Extremity Movements Upper (arms, wrists, hands, fingers): None, normal Lower (legs, knees, ankles, toes): None, normal, Trunk Movements Neck, shoulders, hips: None, normal, Overall Severity Severity of abnormal movements (highest score from questions above): None, normal Incapacitation due to abnormal movements: None, normal Patient's awareness of abnormal movements (rate only patient's report): No Awareness, Dental Status Current problems with teeth and/or dentures?: No Does patient usually wear dentures?: No  CIWA:  CIWA-Ar Total: 1 COWS:  COWS Total Score: 1  Treatment Plan Summary:  1. Continue crisis management and stabilization. 2. Continue medication management by assessing side effects, dose modification as needed and therapeutic blood levels as indicated. 3. Treat health problems as indicated or consult IM as needed. 4. Continue treatment plan to decrease risk of relapse upon discharge and to reduce the need for readmission to incorporate the use of local resources for support. 5. Psycho-social education regarding relapse prevention through good self care to incorporate diet, exercise, stress reduction, good sleep hygiene, and reduction in external risk factors such as alcohol, tobacco, and substance abuse.   Medical Decision Making:  Established Problem, Stable/Improving (1), Review of Psycho-Social Stressors (1) and Review or order clinical lab tests (1)  Milta Deiters T. Mashburn RPAC 3:14 PM 11/25/2014 I reviewed chart and agreed with the findings and treatment Plan.  Berniece Andreas, MD

## 2014-11-25 NOTE — Progress Notes (Signed)
D: Pt has depressed affect and mood.  She states "I'm depressed still."  Pt reports her goal today was "to go to group."  She reports she has been "in bed most of the day.  I went to groups and I went to the meals."  Pt denies SI/HI, denies hallucinations.  Pt has stayed in her room this shift.  She did not attend evening group.   A:  Met with pt 1:1 and provided support and encouragement.  Actively listened to pt.  PRN medication administered for pain. R:  Pt verbally contracts for safety and reports she will notify staff of needs and concerns.  Will continue to monitor and assess.

## 2014-11-26 LAB — HEMOGLOBIN A1C
Hgb A1c MFr Bld: 5.5 % (ref 4.8–5.6)
MEAN PLASMA GLUCOSE: 111 mg/dL

## 2014-11-26 LAB — GLUCOSE, CAPILLARY
Glucose-Capillary: 108 mg/dL — ABNORMAL HIGH (ref 65–99)
Glucose-Capillary: 122 mg/dL — ABNORMAL HIGH (ref 65–99)

## 2014-11-26 NOTE — Plan of Care (Signed)
Problem: Diagnosis: Increased Risk For Suicide Attempt Goal: STG-Patient Will Comply With Medication Regime Outcome: Progressing Pt has been compliant with medications this shift.
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
## 2014-11-26 NOTE — Progress Notes (Signed)
Recreation Therapy Notes  Date: 11/26/14 Time: 9:30am Location: [address] Dayroom  Group Topic: Stress Management  Goal Area(s) Addresses:  Patient will verbalize importance of using healthy stress management.  Patient will identify positive emotions associated with healthy stress management.   Intervention: Stress Management  Activity :  Guided Imagery.  LRT introduced and educated patients on stress management technique of guided imagery.  Scripts were used to deliver the technique to patients, patients were asked to follow script read allowed by LRT to engage in practicing the stress management technique.  Education:  Stress Management, Discharge Planning.   Education Outcome: Needs additional education  Clinical Observations/Feedback:  Patient did not attend.   Victorino Sparrow, LRT/CTRS         Victorino Sparrow A 11/26/2014 3:47 PM

## 2014-11-26 NOTE — BHH Group Notes (Signed)
[facility] LCSW Group Therapy          Overcoming Obstacles       1:15 -2:30        11/26/2014       Type of Therapy:  Group Therapy  Participation Level:  Appropriate  Participation Quality:  Appropriate  Affect:  Appropriate  Cognitive:   Appropriate  Insight: Developing/Improving Engaged  Engagement in Therapy: Developing/Imprvoing Engaged  Modes of Intervention:  Discussion Exploration  Education Rapport BuildingProblem-Solving Support  Summary of Progress/Problems:  The main focus of today's group was overcoming obstacles. She advised the obstacle she has to overcome is not having a place to live.  Patient able to identify appropriate coping skills including returning to [place] where she has support.   Renee Steele 11/26/2014

## 2014-11-26 NOTE — Progress Notes (Signed)
Patient ID: Renee Steele, female   DOB: Jun 11, 1981, 34 y.o.   MRN: [id number] D: Client visible on the unit, in dayroom watching TV, interacting with peers. Client reports "I came to get stable on medications" "I was staying with a friend, she ran into some difficulties and didn't want me there any more" Client reports today's goal "stay positive, with positive thinking" "in group I learned about posiitve coping kills vs unhealthy coping skills" Client reports on discharge she plans to return to [place] with her two weeks supply of medications. 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 A: Writer provided emotional support, encourage client to maintain mental wellness by continuing her medications and locating a therapist in that city. Staff will maintain safety q61min checks. R: Client is safe on the unit, attended group.

## 2014-11-26 NOTE — Progress Notes (Signed)
D: Pt has anxious affect and depressed mood.  Pt continues to report that she feels depressed.  She reports she did not make it to all groups and set a goal to attend evening group, which she did.  Pt denies SI/HI, denies hallucinations, denies pain.  Pt has been visible in milieu interacting with peers and staff appropriately.   A:  Met with pt 1:1 and provided support and encouragement.  Actively listened to pt.  Medications administered per order.   R: Pt is compliant with medications.  Pt verbally contracts for safety.  Will continue to monitor and assess.

## 2014-11-26 NOTE — Progress Notes (Signed)
D: Pt presents with flat affect and depressed mood. Pt rates depression 6/10. Hopeless 5/10. Anxiety 7/10. Pt reports that she's depressed due to not having anywhere to live and no support here in [place] or [place]. Pt appears disheveled this morning. Pt denies suicidal thoughts/homicidal thoughts. Pt compliant with taking meds and denies any adverse reactions to meds.  A: Medications administered as ordered per MD. Verbal support given. Pt encouraged to attend groups. 15 minute checks performed for safety.  R: Pt receptive to treatment.

## 2014-11-26 NOTE — Progress Notes (Signed)
Chaplain responding to spiritual care consult Re:  pt requesting to speak with chaplain   At chaplain arrival, pt not on unit due to attending recreation with other patients.  Following via notes and will follow up to establish contact with pt.  Please page if urgent needs arise.   [place]

## 2014-11-26 NOTE — BHH Group Notes (Signed)
[facility] LCSW Aftercare Discharge Planning Group Note   11/26/2014 12:28 PM    Participation Quality:  Appropraite  Mood/Affect:  Appropriate  Depression Rating:  19  Anxiety Rating:  10  Thoughts of Suicide:  Yes  Will you contract for safety? Yes  Current AVH:  No  Plan for Discharge/Comments:  Patient attended discharge planning group and actively participated in group.  She advised of being homeless at discharge. She will also need a referral for outpatient follow up.  Suicide prevention education reviewed and SPE document provided.   Transportation Means: Patient does not have transportation.   Supports:  Patient does not have a support system.   Romayne Ticas, [place] Post

## 2014-11-27 LAB — URINE MICROSCOPIC-ADD ON

## 2014-11-27 LAB — GLUCOSE, CAPILLARY
GLUCOSE-CAPILLARY: 97 mg/dL (ref 65–99)
Glucose-Capillary: 125 mg/dL — ABNORMAL HIGH (ref 65–99)

## 2014-11-27 LAB — URINALYSIS, ROUTINE W REFLEX MICROSCOPIC
BILIRUBIN URINE: NEGATIVE
Glucose, UA: NEGATIVE mg/dL
Hgb urine dipstick: NEGATIVE
Ketones, ur: NEGATIVE mg/dL
Nitrite: NEGATIVE
PH: 5.5 (ref 5.0–8.0)
Protein, ur: NEGATIVE mg/dL
Specific Gravity, Urine: 1.018 (ref 1.005–1.030)
UROBILINOGEN UA: 1 mg/dL (ref 0.0–1.0)
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
## 2014-11-27 NOTE — Progress Notes (Signed)
Adult Psychoeducational Group Note  Date:  11/27/2014 Time:  0900  Group Topic/Focus:  Orientation:   The focus of this group is to educate the patient on the purpose and policies of crisis stabilization and provide a format to answer questions about their admission.  The group details unit policies and expectations of patients while admitted.  Participation Level:  Active  Participation Quality:  Appropriate  Affect:  Appropriate  Cognitive:  Appropriate  Insight: Appropriate  Engagement in Group:  Engaged  Modes of Intervention:  Education  Additional Comments:   Onelia Cadmus L 11/27/2014, 9:59 AM

## 2014-11-27 NOTE — Progress Notes (Signed)
The focus of this group is to help patients review their daily goal of treatment and discuss progress on daily workbooks. Pt attended the evening group session and responded to all discussion prompts from the [place]. Pt shared that today was a good day on the hall, the highlight of which was a good conversation she had with her doctor. "He explained a lot to me and made me feel good about my goals." Pt reported having no additional needs from Nursing Staff this evening. Pt's affect was appropriate.
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
## 2014-11-27 NOTE — [facility] Group Notes (Signed)
[facility] LCSW Group Therapy  11/27/2014   1:15 PM   Type of Therapy:  Group Therapy  Participation Level:  Minimal  Participation Quality:  Minimal  Affect:  Lethargic  Cognitive:  Alert and Oriented  Insight:  Developing/Improving and Engaged  Engagement in Therapy:  Developing/Improving and Engaged  Modes of Intervention:  Clarification, Confrontation, Discussion, Education, Exploration, Limit-setting, Orientation, Problem-solving, Rapport Building, Art therapist, Socialization and Support  Summary of Progress/Problems: The topic for group therapy was feelings about diagnosis.  Pt actively participated in group discussion on their past and current diagnosis and how they feel towards this.  Pt also identified how society and family members judge them, based on their diagnosis as well as stereotypes and stigmas.  Patient participated in discussion minimally and was observed sleeping during group.  Tilden Fossa, MSW, [place] Worker [facility] [phone]

## 2014-11-27 NOTE — Plan of Care (Signed)
Problem: Ineffective individual coping Goal: LTG: Patient will report a decrease in negative feelings Outcome: Progressing Pt states "I feel better."
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
## 2014-11-28 LAB — URINE CULTURE

## 2014-11-28 LAB — GLUCOSE, CAPILLARY: GLUCOSE-CAPILLARY: 100 mg/dL — AB (ref 65–99)

## 2014-11-28 MED ORDER — LURASIDONE HCL 80 MG PO TABS: 80.0000 mg | ORAL_TABLET | Freq: Every day | ORAL | Status: DC

## 2014-11-28 MED ORDER — CIPROFLOXACIN HCL 250 MG PO TABS
250.0000 mg | ORAL_TABLET | Freq: Two times a day (BID) | ORAL | Status: DC
  Administered 2014-11-28: 250 mg via ORAL
  Filled 2014-11-28 (×2): qty 6
  Filled 2014-11-28 (×5): qty 1

## 2014-11-28 MED ORDER — ESCITALOPRAM OXALATE 20 MG PO TABS: 20.0000 mg | ORAL_TABLET | Freq: Every day | ORAL | Status: DC

## 2014-11-28 MED ORDER — TRAZODONE HCL 100 MG PO TABS: 100.0000 mg | ORAL_TABLET | Freq: Every evening | ORAL | Status: DC | PRN

## 2014-11-28 MED ORDER — AMLODIPINE BESYLATE 10 MG PO TABS: 10.0000 mg | ORAL_TABLET | Freq: Every day | ORAL | Status: DC

## 2014-11-28 MED ORDER — LISINOPRIL 40 MG PO TABS: 40.0000 mg | ORAL_TABLET | Freq: Every day | ORAL | Status: DC

## 2014-11-28 MED ORDER — HYDROXYZINE HCL 25 MG PO TABS: 25.0000 mg | ORAL_TABLET | Freq: Four times a day (QID) | ORAL | Status: DC | PRN

## 2014-11-28 MED ORDER — LEVOTHYROXINE SODIUM 50 MCG PO TABS: 50.0000 ug | ORAL_TABLET | Freq: Every day | ORAL | Status: DC

## 2014-11-28 MED ORDER — CIPROFLOXACIN HCL 250 MG PO TABS: 250.0000 mg | ORAL_TABLET | Freq: Two times a day (BID) | ORAL | Status: DC

## 2014-11-28 NOTE — Progress Notes (Signed)
D/C instructions/meds/follow-up appointments reviewed, pt verbalized understanding, pt's belongings returned to pt, samples given, bus passes given, denies SI/HI/AVH--affect bright/smiling at discharge.

## 2014-11-28 NOTE — [facility] Group Notes (Signed)
[facility] LCSW Aftercare Discharge Planning Group Note  11/28/2014  8:45 AM  Participation Quality: Did Not Attend. Patient invited to participate but declined.  Tilden Fossa, MSW, [place] Worker [facility] [phone]

## 2014-11-28 NOTE — Discharge Summary (Signed)
Physician Discharge Summary Note  Patient:  Renee Steele is an 34 y.o., female MRN:  [id number] DOB:  [birth year] Patient phone:  [phone] (home)  Patient address:   [address] [place] [address],  Total Time spent with patient: 45 minutes  Date of Admission:  11/23/2014 Date of Discharge: 11/28/2014  Reason for Admission:  Psychosis  Principal Problem: Bipolar 1 disorder, depressed, severe Discharge Diagnoses: Patient Active Problem List   Diagnosis Date Noted  . Bipolar 1 disorder, depressed, severe [F31.4] 11/24/2014    Musculoskeletal: Strength & Muscle Tone: within normal limits Gait & Station: normal Patient leans: N/A  Psychiatric Specialty Exam:  SEE SRA Physical Exam  Vitals reviewed. Psychiatric: Her mood appears anxious.    Review of Systems  Constitutional: Negative for fever and chills.  Eyes: Negative for blurred vision.  Cardiovascular: Negative for chest pain.  Neurological: Negative for headaches.    Blood pressure 122/72, pulse 102, temperature 98.4 F (36.9 C), temperature source Oral, resp. rate 18, height 5\' 9"  (1.753 m), weight 184.614 kg (407 lb), last menstrual period 10/01/2014.Body mass index is 60.08 kg/(m^2).  Have you used any form of tobacco in the last 30 days? (Cigarettes, Smokeless Tobacco, Cigars, and/or Pipes): No  Has this patient used any form of tobacco in the last 30 days? (Cigarettes, Smokeless Tobacco, Cigars, and/or Pipes) N/A  Past Medical History:  Past Medical History  Diagnosis Date  . Depression   . Hypertension   . Post traumatic stress disorder (PTSD)   . Diabetes mellitus without complication   . Hypothyroidism    History reviewed. No pertinent past surgical history. Family History: History reviewed. No pertinent family history. Social History:  History  Alcohol Use No     History  Drug Use No    History   Social History  . Marital Status: Single    Spouse Name: N/A  . Number of  Children: N/A  . Years of Education: N/A   Social History Main Topics  . Smoking status: Never Smoker   . Smokeless tobacco: Not on file  . Alcohol Use: No  . Drug Use: No  . Sexual Activity: Yes    Birth Control/ Protection: None   Other Topics Concern  . None   Social History Narrative   Risk to Self: Is patient at risk for suicide?: Yes What has been your use of drugs/alcohol within the last 12 months?: Pt denies Risk to Others:   Prior Inpatient Therapy:   Prior Outpatient Therapy:    Level of Care:  OP  Hospital Course:  Gay Moncivais was admitted for Bipolar 1 disorder, depressed, severe and crisis management.  She was treated discharged with the medications listed below under Medication List.  Medical problems were identified and treated as needed.  Home medications were restarted as appropriate.  Improvement was monitored by observation and Verlin Fester daily report of symptom reduction.  Emotional and mental status was monitored by daily self-inventory reports completed by Verlin Fester and clinical staff.         Evagelia Knack was evaluated by the treatment team for stability and plans for continued recovery upon discharge.  Avonda Toso motivation was an integral factor for scheduling further treatment.  Employment, transportation, bed availability, health status, family support, and any pending legal issues were also considered during her hospital stay.  She was offered further treatment options upon discharge including but not limited to Residential, Intensive Outpatient, and Outpatient treatment.  Verlin Fester  will follow up with the services as listed below under Follow Up Information.     Upon completion of this admission the patient was both mentally and medically stable for discharge denying suicidal/homicidal ideation, auditory/visual/tactile hallucinations, delusional thoughts and paranoia.      Consults:  psychiatry  Significant Diagnostic Studies:  labs: per  ED  Discharge Vitals:   Blood pressure 122/72, pulse 102, temperature 98.4 F (36.9 C), temperature source Oral, resp. rate 18, height 5\' 9"  (1.753 m), weight 184.614 kg (407 lb), last menstrual period 10/01/2014. Body mass index is 60.08 kg/(m^2). Lab Results:   Results for orders placed or performed during the hospital encounter of 11/23/14 (from the past 72 hour(s))  Glucose, capillary     Status: Abnormal   Collection Time: 11/25/14  4:45 PM  Result Value Ref Range   Glucose-Capillary 109 (H) 65 - 99 mg/dL   Comment 1 Notify RN    Comment 2 Document in Chart   Glucose, capillary     Status: Abnormal   Collection Time: 11/25/14  8:45 PM  Result Value Ref Range   Glucose-Capillary 123 (H) 65 - 99 mg/dL  Glucose, capillary     Status: Abnormal   Collection Time: 11/26/14  6:47 AM  Result Value Ref Range   Glucose-Capillary 108 (H) 65 - 99 mg/dL  Glucose, capillary     Status: Abnormal   Collection Time: 11/26/14  4:59 PM  Result Value Ref Range   Glucose-Capillary 122 (H) 65 - 99 mg/dL   Comment 1 Notify RN    Comment 2 Document in Chart   Glucose, capillary     Status: None   Collection Time: 11/27/14  6:11 AM  Result Value Ref Range   Glucose-Capillary 97 65 - 99 mg/dL  Urinalysis, Routine w reflex microscopic     Status: Abnormal   Collection Time: 11/27/14 12:07 PM  Result Value Ref Range   Color, Urine YELLOW YELLOW   APPearance CLOUDY (A) CLEAR   Specific Gravity, Urine 1.018 1.005 - 1.030   pH 5.5 5.0 - 8.0   Glucose, UA NEGATIVE NEGATIVE mg/dL   Hgb urine dipstick NEGATIVE NEGATIVE   Bilirubin Urine NEGATIVE NEGATIVE   Ketones, ur NEGATIVE NEGATIVE mg/dL   Protein, ur NEGATIVE NEGATIVE mg/dL   Urobilinogen, UA 1.0 0.0 - 1.0 mg/dL   Nitrite NEGATIVE NEGATIVE   Leukocytes, UA MODERATE (A) NEGATIVE    Comment: Performed at [facility] [place]  Urine microscopic-add on     Status: Abnormal   Collection Time: 11/27/14 12:07 PM  Result Value Ref Range    Squamous Epithelial / LPF RARE RARE   WBC, UA 3-6 <3 WBC/hpf   RBC / HPF 0-2 <3 RBC/hpf   Bacteria, UA MANY (A) RARE   Casts GRANULAR CAST (A) NEGATIVE    Comment: Performed at [facility]  Glucose, capillary     Status: Abnormal   Collection Time: 11/27/14  4:38 PM  Result Value Ref Range   Glucose-Capillary 125 (H) 65 - 99 mg/dL  Glucose, capillary     Status: Abnormal   Collection Time: 11/28/14  6:29 AM  Result Value Ref Range   Glucose-Capillary 100 (H) 65 - 99 mg/dL    Physical Findings: AIMS: Facial and Oral Movements Muscles of Facial Expression: None, normal Lips and Perioral Area: None, normal Jaw: None, normal Tongue: None, normal,Extremity Movements Upper (arms, wrists, hands, fingers): None, normal Lower (legs, knees, ankles, toes): None, normal, Trunk Movements Neck, shoulders,  hips: None, normal, Overall Severity Severity of abnormal movements (highest score from questions above): None, normal Incapacitation due to abnormal movements: None, normal Patient's awareness of abnormal movements (rate only patient's report): No Awareness, Dental Status Current problems with teeth and/or dentures?: No Does patient usually wear dentures?: No  CIWA:  CIWA-Ar Total: 1 COWS:  COWS Total Score: 1   See Psychiatric Specialty Exam and Suicide Risk Assessment completed by Attending Physician prior to discharge.  Discharge destination:  Home  Is patient on multiple antipsychotic therapies at discharge:  No   Has Patient had three or more failed trials of antipsychotic monotherapy by history:  No    Recommended Plan for Multiple Antipsychotic Therapies: NA     Medication List    STOP taking these medications        MULTIVITAMIN & MINERAL PO      TAKE these medications      Indication   amLODipine 10 MG tablet  Commonly known as:  NORVASC  Take 1 tablet (10 mg total) by mouth daily.   Indication:  High Blood Pressure     ciprofloxacin  250 MG tablet  Commonly known as:  CIPRO  Take 1 tablet (250 mg total) by mouth 2 (two) times daily.   Indication:  UTI     escitalopram 20 MG tablet  Commonly known as:  LEXAPRO  Take 1 tablet (20 mg total) by mouth daily.   Indication:  Depression     hydrOXYzine 25 MG tablet  Commonly known as:  ATARAX/VISTARIL  Take 1 tablet (25 mg total) by mouth every 6 (six) hours as needed for anxiety (sleep).   Indication:  Anxiety Neurosis     levothyroxine 50 MCG tablet  Commonly known as:  SYNTHROID, LEVOTHROID  Take 1 tablet (50 mcg total) by mouth daily before breakfast.   Indication:  Underactive Thyroid     lisinopril 40 MG tablet  Commonly known as:  PRINIVIL,ZESTRIL  Take 1 tablet (40 mg total) by mouth daily.   Indication:  High Blood Pressure     lurasidone 80 MG Tabs tablet  Commonly known as:  LATUDA  Take 1 tablet (80 mg total) by mouth at bedtime.   Indication:  Depressive Phase of Manic-Depression     traZODone 100 MG tablet  Commonly known as:  DESYREL  Take 1 tablet (100 mg total) by mouth at bedtime as needed for sleep.   Indication:  Major Depressive Disorder           Follow-up Information    Follow up with [facility] On 12/03/2014.   Specialty:  Behavioral Health   Why:  Please go to [facility] walk-in clinic Monday-Friday between 8 am to 3 pm to get established with therapy and medication management services.   Contact information:   [place] [address] [phone]       Please follow up.   Why:  Please contact [facility] Department at [phone] for referral for mental health services upon arriving in [place]      Follow-up recommendations:  Activity:  as tol, diet as tol  Comments:  1.  Take all your medications as prescribed.              2.  Report any adverse side effects to outpatient provider.                       3.  Patient instructed to not  use alcohol or illegal drugs while  on prescription medicines.            4.  In the event of worsening symptoms, instructed patient to call 911, the crisis hotline or go to nearest emergency room for evaluation of symptoms.  Total Discharge Time:  30 min   Signed: Freda Munro May Mililani Murthy AGNP-BC 11/28/2014, 1:57 PM   Patient seen, Suicide Assessment Completed.  Disposition Plan Reviewed

## 2014-11-28 NOTE — Clinical Social Work Note (Signed)
CSW and MD met with patient to discuss discharge planning.  Patient advised of wanting to discharge today.  She stated she will return to friend's home until she can make arrangements to return to [place].  Patient rated depression at eight but stated she is stable.  She informed that she has things that need to be followed up on and she can not do those those from the hospital with out access to her phone and computer.  Patient to discharge today.
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
## 2014-11-28 NOTE — Progress Notes (Signed)
Recreation Therapy Notes  Date: 11/28/14 Time: 9:30am Location: [address] Group Room  Group Topic: Stress Management  Goal Area(s) Addresses:  Patient will verbalize importance of using healthy stress management.  Patient will identify positive emotions associated with healthy stress management.   Intervention: Progressive Muscle Relaxation Script  Activity :  Patient will listen as LRT leads them through progressive muscle relaxation.  Patients will be lead through each muscle group from head to toe.  Education:  Stress Management, Discharge Planning.   Education Outcome: Acknowledges edcuation/In group clarification offered/Needs additional education  Clinical Observations/Feedback: Patient did not attend.  Victorino Sparrow, LRT/CTRS         Victorino Sparrow A 11/28/2014 1:44 PM

## 2014-11-28 NOTE — Progress Notes (Signed)
  [facility] Adult Case Management Discharge Plan :  Will you be returning to the same living situation after discharge:  Yes,  Patient is returning to friends home until she relocates to [place] At discharge, do you have transportation home?: Yes,  Patient assisted with bus pass. Do you have the ability to pay for your medications: No.  Patient will need assistance with indigent medications.  Release of information consent forms completed and in the chart;  Patient's signature needed at discharge.  Patient to Follow up at: Follow-up Information    Follow up with [facility] On 12/03/2014.   Specialty:  Behavioral Health   Why:  Please go to [facility] walk-in clinic Monday-Friday between 8 am to 3 pm to get established with therapy and medication management services.   Contact information:   [place] [address] [phone]       Patient denies SI/HI:  Patient no longer endorsing SI/HI or other thoughts of self harm.   Safety Planning and Suicide Prevention discussed: .Reviewed with all patients during discharge planning group  Have you used any form of tobacco in the last 30 days? (Cigarettes, Smokeless Tobacco, Cigars, and/or Pipes): No  Has patient been referred to the Quitline?: Patient declined referral to Quitline.   Concha Pyo 11/28/2014, 10:56 AM

## 2015-02-23 ENCOUNTER — Emergency Department (HOSPITAL_BASED_OUTPATIENT_CLINIC_OR_DEPARTMENT_OTHER)
Admission: EM | Admit: 2015-02-23 | Discharge: 2015-02-23 | Disposition: A | Discharge status: Other Acute Inpatient Hospital | Payer: Self-pay | Attending: Emergency Medicine | Admitting: Emergency Medicine

## 2015-02-23 ENCOUNTER — Encounter (HOSPITAL_BASED_OUTPATIENT_CLINIC_OR_DEPARTMENT_OTHER): Payer: Self-pay | Admitting: *Deleted

## 2015-02-23 ENCOUNTER — Inpatient Hospital Stay (HOSPITAL_COMMUNITY)
Admission: AD | Admit: 2015-02-23 | Discharge: 2015-02-28 | DRG: 885 | Disposition: A | Discharge status: Home / Self Care | Payer: Federal, State, Local not specified - Other | Source: Intra-hospital | Attending: Psychiatry | Admitting: Psychiatry

## 2015-02-23 DIAGNOSIS — I10 Essential (primary) hypertension: Secondary | ICD-10-CM | POA: Diagnosis present

## 2015-02-23 DIAGNOSIS — F314 Bipolar disorder, current episode depressed, severe, without psychotic features: Secondary | ICD-10-CM | POA: Diagnosis not present

## 2015-02-23 DIAGNOSIS — G4700 Insomnia, unspecified: Secondary | ICD-10-CM | POA: Diagnosis present

## 2015-02-23 DIAGNOSIS — R45851 Suicidal ideations: Secondary | ICD-10-CM

## 2015-02-23 DIAGNOSIS — Z79899 Other long term (current) drug therapy: Secondary | ICD-10-CM

## 2015-02-23 DIAGNOSIS — E282 Polycystic ovarian syndrome: Secondary | ICD-10-CM | POA: Diagnosis present

## 2015-02-23 DIAGNOSIS — E119 Type 2 diabetes mellitus without complications: Secondary | ICD-10-CM

## 2015-02-23 DIAGNOSIS — F22 Delusional disorders: Secondary | ICD-10-CM | POA: Diagnosis present

## 2015-02-23 DIAGNOSIS — E039 Hypothyroidism, unspecified: Secondary | ICD-10-CM | POA: Diagnosis present

## 2015-02-23 DIAGNOSIS — F329 Major depressive disorder, single episode, unspecified: Secondary | ICD-10-CM | POA: Insufficient documentation

## 2015-02-23 DIAGNOSIS — Z818 Family history of other mental and behavioral disorders: Secondary | ICD-10-CM | POA: Diagnosis not present

## 2015-02-23 DIAGNOSIS — F3113 Bipolar disorder, current episode manic without psychotic features, severe: Secondary | ICD-10-CM | POA: Diagnosis present

## 2015-02-23 DIAGNOSIS — Z6841 Body Mass Index (BMI) 40.0 and over, adult: Secondary | ICD-10-CM

## 2015-02-23 DIAGNOSIS — Z3202 Encounter for pregnancy test, result negative: Secondary | ICD-10-CM | POA: Insufficient documentation

## 2015-02-23 DIAGNOSIS — F319 Bipolar disorder, unspecified: Secondary | ICD-10-CM | POA: Diagnosis present

## 2015-02-23 DIAGNOSIS — F332 Major depressive disorder, recurrent severe without psychotic features: Secondary | ICD-10-CM | POA: Diagnosis present

## 2015-02-23 HISTORY — DX: Morbid (severe) obesity due to excess calories: E66.01

## 2015-02-23 LAB — URINE MICROSCOPIC-ADD ON

## 2015-02-23 LAB — ETHANOL

## 2015-02-23 LAB — CBC WITH DIFFERENTIAL/PLATELET
Basophils Absolute: 0 10*3/uL (ref 0.0–0.1)
Basophils Relative: 0 % (ref 0–1)
EOS ABS: 0 10*3/uL (ref 0.0–0.7)
Eosinophils Relative: 1 % (ref 0–5)
HCT: 40.6 % (ref 36.0–46.0)
HEMOGLOBIN: 13 g/dL (ref 12.0–15.0)
Lymphocytes Relative: 29 % (ref 12–46)
Lymphs Abs: 1.7 10*3/uL (ref 0.7–4.0)
MCH: 25.9 pg — ABNORMAL LOW (ref 26.0–34.0)
MCHC: 32 g/dL (ref 30.0–36.0)
MCV: 81 fL (ref 78.0–100.0)
MONOS PCT: 9 % (ref 3–12)
Monocytes Absolute: 0.5 10*3/uL (ref 0.1–1.0)
Neutro Abs: 3.7 10*3/uL (ref 1.7–7.7)
Neutrophils Relative %: 61 % (ref 43–77)
Platelets: 365 10*3/uL (ref 150–400)
RBC: 5.01 MIL/uL (ref 3.87–5.11)
RDW: 12.9 % (ref 11.5–15.5)
WBC: 6 10*3/uL (ref 4.0–10.5)

## 2015-02-23 LAB — URINALYSIS, ROUTINE W REFLEX MICROSCOPIC
Bilirubin Urine: NEGATIVE
Glucose, UA: NEGATIVE mg/dL
Hgb urine dipstick: NEGATIVE
Ketones, ur: NEGATIVE mg/dL
NITRITE: NEGATIVE
Protein, ur: NEGATIVE mg/dL
SPECIFIC GRAVITY, URINE: 1.022 (ref 1.005–1.030)
Urobilinogen, UA: 0.2 mg/dL (ref 0.0–1.0)
pH: 5 (ref 5.0–8.0)

## 2015-02-23 LAB — BASIC METABOLIC PANEL
Anion gap: 10 (ref 5–15)
BUN: 10 mg/dL (ref 6–20)
CHLORIDE: 105 mmol/L (ref 101–111)
CO2: 28 mmol/L (ref 22–32)
CREATININE: 0.69 mg/dL (ref 0.44–1.00)
Calcium: 9.3 mg/dL (ref 8.9–10.3)
GFR calc non Af Amer: >60 mL/min (ref >60)
Glucose, Bld: 109 mg/dL — ABNORMAL HIGH (ref 65–99)
POTASSIUM: 3.9 mmol/L (ref 3.5–5.1)
Sodium: 143 mmol/L (ref 135–145)

## 2015-02-23 LAB — PREGNANCY, URINE: Preg Test, Ur: NEGATIVE

## 2015-02-23 MED ORDER — ACETAMINOPHEN 325 MG PO TABS
ORAL_TABLET | ORAL | Status: AC
  Filled 2015-02-23: qty 2

## 2015-02-23 MED ORDER — IBUPROFEN 400 MG PO TABS
400.0000 mg | ORAL_TABLET | Freq: Once | ORAL | Status: AC
  Administered 2015-02-23: 400 mg via ORAL
  Filled 2015-02-23: qty 1

## 2015-02-23 NOTE — ED Notes (Signed)
Pt ambulatory to the [employer].
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
## 2015-02-23 NOTE — ED Notes (Signed)
I called back again to give report but the secretary, Gabriel Cirri, said that the nurse has my name and number and when he is available he will call me back.

## 2015-02-23 NOTE — BH Assessment (Signed)
Dr. Shea Evans accepts Pt to the service of Dr. Wanda Plump. [place]. Debarah Crape, [facility], confirms bed availability, room 404-1. Notified Dr. Stark Jock of acceptance.  Orpah Greek Anson Fret, [place], [facility], [facility] Triage Specialist [phone]
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
## 2015-02-23 NOTE — ED Notes (Signed)
Attempted to call report to [facility] but receiving nurse is not available at this time. They will call back.

## 2015-02-23 NOTE — ED Notes (Signed)
Pt has eaten a microwave meal prior to d/c to [facility].

## 2015-02-23 NOTE — ED Notes (Signed)
Reports that she has not had her medication in 3-4 months and would like to be started back on it,

## 2015-02-23 NOTE — ED Notes (Signed)
Pt reports depression.  Seen and treated yesterday for same at [facility]-left AMA.  Pt denies thoughts of harming herself or others. Denies AH, VH.

## 2015-02-24 ENCOUNTER — Encounter (HOSPITAL_COMMUNITY): Payer: Self-pay

## 2015-02-24 DIAGNOSIS — E282 Polycystic ovarian syndrome: Secondary | ICD-10-CM | POA: Diagnosis present

## 2015-02-24 DIAGNOSIS — E119 Type 2 diabetes mellitus without complications: Secondary | ICD-10-CM

## 2015-02-24 DIAGNOSIS — F332 Major depressive disorder, recurrent severe without psychotic features: Secondary | ICD-10-CM | POA: Diagnosis present

## 2015-02-24 DIAGNOSIS — E039 Hypothyroidism, unspecified: Secondary | ICD-10-CM | POA: Diagnosis present

## 2015-02-24 DIAGNOSIS — R45851 Suicidal ideations: Secondary | ICD-10-CM

## 2015-02-24 MED ORDER — AMLODIPINE BESYLATE 10 MG PO TABS
10.0000 mg | ORAL_TABLET | Freq: Every day | ORAL | Status: DC
  Administered 2015-02-24 – 2015-02-28 (×5): 10 mg via ORAL
  Filled 2015-02-24 (×8): qty 1

## 2015-02-24 MED ORDER — IBUPROFEN 600 MG PO TABS
600.0000 mg | ORAL_TABLET | Freq: Four times a day (QID) | ORAL | Status: DC | PRN
  Administered 2015-02-24 – 2015-02-26 (×3): 600 mg via ORAL
  Filled 2015-02-24 (×3): qty 1

## 2015-02-24 MED ORDER — ALUM & MAG HYDROXIDE-SIMETH 200-200-20 MG/5ML PO SUSP
30.0000 mL | ORAL | Status: DC | PRN
  Administered 2015-02-27: 30 mL via ORAL
  Filled 2015-02-24: qty 30

## 2015-02-24 MED ORDER — RISPERIDONE 1 MG PO TABS
1.0000 mg | ORAL_TABLET | Freq: Every day | ORAL | Status: DC
  Administered 2015-02-24: 1 mg via ORAL
  Filled 2015-02-24 (×4): qty 1

## 2015-02-24 MED ORDER — TRAZODONE HCL 100 MG PO TABS
100.0000 mg | ORAL_TABLET | Freq: Every evening | ORAL | Status: DC | PRN
  Administered 2015-02-24 – 2015-02-27 (×5): 100 mg via ORAL
  Filled 2015-02-24 (×4): qty 1
  Filled 2015-02-24: qty 14
  Filled 2015-02-24: qty 1

## 2015-02-24 MED ORDER — LISINOPRIL 40 MG PO TABS
40.0000 mg | ORAL_TABLET | Freq: Every day | ORAL | Status: DC
  Administered 2015-02-24 – 2015-02-28 (×5): 40 mg via ORAL
  Filled 2015-02-24: qty 2
  Filled 2015-02-24 (×7): qty 1

## 2015-02-24 MED ORDER — LAMOTRIGINE 25 MG PO TABS
25.0000 mg | ORAL_TABLET | Freq: Every day | ORAL | Status: DC
  Administered 2015-02-24 – 2015-02-28 (×5): 25 mg via ORAL
  Filled 2015-02-24 (×6): qty 1
  Filled 2015-02-24: qty 14
  Filled 2015-02-24 (×2): qty 1

## 2015-02-24 MED ORDER — LEVOTHYROXINE SODIUM 50 MCG PO TABS
50.0000 ug | ORAL_TABLET | Freq: Every day | ORAL | Status: DC
Start: 2015-02-24 — End: 2015-02-28
  Administered 2015-02-24 – 2015-02-28 (×5): 50 ug via ORAL
  Filled 2015-02-24 (×3): qty 1
  Filled 2015-02-24: qty 2
  Filled 2015-02-24 (×4): qty 1

## 2015-02-24 MED ORDER — BENZTROPINE MESYLATE 0.5 MG PO TABS
0.5000 mg | ORAL_TABLET | Freq: Every day | ORAL | Status: DC
  Administered 2015-02-24: 0.5 mg via ORAL
  Filled 2015-02-24 (×4): qty 1

## 2015-02-24 MED ORDER — ACETAMINOPHEN 325 MG PO TABS
650.0000 mg | ORAL_TABLET | Freq: Four times a day (QID) | ORAL | Status: DC | PRN
  Administered 2015-02-24 – 2015-02-26 (×2): 650 mg via ORAL
  Filled 2015-02-24 (×2): qty 2

## 2015-02-24 MED ORDER — MAGNESIUM HYDROXIDE 400 MG/5ML PO SUSP: 30.0000 mL | Freq: Every day | ORAL | Status: DC | PRN

## 2015-02-24 MED ORDER — HYDROXYZINE HCL 25 MG PO TABS
25.0000 mg | ORAL_TABLET | Freq: Four times a day (QID) | ORAL | Status: DC | PRN
  Administered 2015-02-24 – 2015-02-26 (×5): 25 mg via ORAL
  Filled 2015-02-24 (×3): qty 1
  Filled 2015-02-24: qty 30
  Filled 2015-02-24 (×2): qty 1

## 2015-02-24 NOTE — Progress Notes (Signed)
Room changed 301/2
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
## 2015-02-24 NOTE — Plan of Care (Signed)
Problem: Diagnosis: Increased Risk For Suicide Attempt Goal: STG-Patient Will Comply With Medication Regime Outcome: Progressing Pt  Compliant with medication regimen

## 2015-02-24 NOTE — BHH Group Notes (Signed)
[facility] Group Notes:  (Nursing/MHT/Case Management/Adjunct)  Date:  02/24/2015  Time: 0900 Type of Therapy:  Nurse Education  Participation Level:  Did Not Attend  Forrestine Him 02/24/2015, 1:38 PM

## 2015-02-24 NOTE — Progress Notes (Signed)
EKG completed and placed on front of the chart

## 2015-02-24 NOTE — Progress Notes (Addendum)
D: Per patient self inventory form pt reports she slept good with the use of sleep medication. She reports a fair appetite, low energy level, good concentration. She rates depression 7/10, hopelessness 7/10, anxiety 7/10- all on 0-10 scale, 10 being the worse. She denies SI/HI. Denies AVH. Forwards little on approach at start of shift. As shift progresses pt more open and not as irritable during conversation. Did not attend nursing group on the unit. Reports her goal for the day is "be happy".  A:Special checks q 15 mins in place for safety. Medication administered per MD order(see eMAR), Encouragement and support provided.  R:Safety maintained. Will continue to monitor.

## 2015-02-24 NOTE — H&P (Signed)
Psychiatric Admission Assessment Adult  Patient Identification: Renee Steele MRN:  [id number] Date of Evaluation:  02/24/2015 Chief Complaint:  MDD Principal Diagnosis: <principal problem not specified> Diagnosis:   Patient Active Problem List   Diagnosis Date Noted  . MDD (major depressive disorder), recurrent severe, without psychosis [F33.2] 02/24/2015  . Bipolar 1 disorder, depressed, severe [F31.4] 11/24/2014   History of Present Illness:: Patient states that she was feel depressed "I wasn't happy and was having suicidal thoughts."  Patient states that she just move here from [place] to live with a friend who is working and going to school.  "I lost my job because of my depression; my friend said that I could move here with here and help her with her child while she was at work and school until I found a job.  But I feel bad not being able to contribute nothing and her daughter is only 4 years old but she is so disrespectful and some times I feel like I'm going to snap.  I don't know if it is because it is because I'm off of my medicine or what."  Patient states that she has had problems with depression "all my life" At this time patient denise suicidal thoughts, homicidal ideation, psychosis, and paranoia.  Continues to endorse depression, anxiety, feelings of worthlessness, helplessness, guilt, fatigue, and irritability.    Elements:    Location:  Worsening depression. Quality:  Suicidal thoughts . Severity:  Sever. Duration:  1 week. Associated Signs/Symptoms: Depression Symptoms:  depressed mood, insomnia, fatigue, feelings of worthlessness/guilt, hopelessness, recurrent thoughts of death, suicidal thoughts without plan, anxiety, panic attacks, loss of energy/fatigue, increased appetite, (Hypo) Manic Symptoms:  Irritable Mood, Anxiety Symptoms:  Excessive Worry, Panic Symptoms, Psychotic Symptoms:  Denies PTSD Symptoms: Denies Total Time spent with patient: 1  hour  Past Medical History:  Past Medical History  Diagnosis Date  . Depression   . Hypertension   . Post traumatic stress disorder (PTSD)   . Diabetes mellitus without complication   . Hypothyroidism   . Morbid obesity     Past Surgical History  Procedure Laterality Date  . Tonsillectomy    . Wisdom tooth extraction     Family History: History reviewed. No pertinent family history. Social History:  History  Alcohol Use No     History  Drug Use No    Social History   Social History  . Marital Status: Single    Spouse Name: N/A  . Number of Children: N/A  . Years of Education: N/A   Social History Main Topics  . Smoking status: Never Smoker   . Smokeless tobacco: None  . Alcohol Use: No  . Drug Use: No  . Sexual Activity: Yes    Birth Control/ Protection: None   Other Topics Concern  . None   Social History Narrative   Additional Social History:    Pain Medications: None Prescriptions: None Over the Counter: None History of alcohol / drug use?: No history of alcohol / drug abuse   Musculoskeletal: Strength & Muscle Tone: within normal limits Gait & Station: normal Patient leans: N/A  Psychiatric Specialty Exam: Physical Exam  Constitutional: She is oriented to person, place, and time.  Neck: Normal range of motion.  Respiratory: Effort normal.  Musculoskeletal: Normal range of motion.  Neurological: She is alert and oriented to person, place, and time.  Psychiatric: Her speech is normal and behavior is normal. Her mood appears anxious. She exhibits a depressed mood. She expresses  suicidal ideation. She expresses no suicidal plans.    Review of Systems  Endo/Heme/Allergies:       Diabetes Hypothyroid   Psychiatric/Behavioral: Positive for depression and suicidal ideas (Denies at this time). The patient is nervous/anxious.     Blood pressure 127/65, pulse 111, temperature 98.4 F (36.9 C), temperature source Oral, resp. rate 20, height _0   (1.727 m), weight 185.975 kg (410 lb), last menstrual period 10/03/2014.Body mass index is 62.35 kg/(m^2).  General Appearance: Disheveled  Eye Sport and exercise psychologist::  Fair  Speech:  Clear and Coherent  Volume:  Normal  Mood:  Anxious and Depressed  Affect:  Appropriate  Thought Process:  Coherent  Orientation:  Full (Time, Place, and Person)  Thought Content:  Rumination  Suicidal Thoughts:  Yes.  without intent/plan  Homicidal Thoughts:  No  Memory:  Immediate;   Fair Recent;   Fair Remote;   Fair  Judgement:  Impaired  Insight:  Fair  Psychomotor Activity:  Normal  Concentration:  Fair  Recall:  [employer] of Knowledge:Fair  Language: Good  Akathisia:  No  Handed:  Right  AIMS (if indicated):     Assets:  Communication Skills Desire for Improvement  ADL's:  Intact  Cognition: WNL  Sleep:  Number of Hours: 2   Risk to Self: Is patient at risk for suicide?: No Risk to Others:   Prior Inpatient Therapy:   Prior Outpatient Therapy:    Alcohol Screening: 1. How often do you have a drink containing alcohol?: Monthly or less 2. How many drinks containing alcohol do you have on a typical day when you are drinking?: 1 or 2 3. How often do you have six or more drinks on one occasion?: Never Preliminary Score: 0 4. How often during the last year have you found that you were not able to stop drinking once you had started?: Never 5. How often during the last year have you failed to do what was normally expected from you becasue of drinking?: Never 6. How often during the last year have you needed a first drink in the morning to get yourself going after a heavy drinking session?: Never 7. How often during the last year have you had a feeling of guilt of remorse after drinking?: Never 8. How often during the last year have you been unable to remember what happened the night before because you had been drinking?: Never 9. Have you or someone else been injured as a result of your drinking?: No 10.  Has a relative or friend or a doctor or another health worker been concerned about your drinking or suggested you cut down?: No Alcohol Use Disorder Identification Test Final Score (AUDIT): 1 Brief Intervention: AUDIT score less than 7 or less-screening does not suggest unhealthy drinking-brief intervention not indicated  Allergies:  No Known Allergies Lab Results:  Results for orders placed or performed during the hospital encounter of 02/23/15 (from the past 48 hour(s))  Basic metabolic panel     Status: Abnormal   Collection Time: 02/23/15  6:30 PM  Result Value Ref Range   Sodium 143 135 - 145 mmol/L   Potassium 3.9 3.5 - 5.1 mmol/L   Chloride 105 101 - 111 mmol/L   CO2 28 22 - 32 mmol/L   Glucose, Bld 109 (H) 65 - 99 mg/dL   BUN 10 6 - 20 mg/dL   Creatinine, Ser 0.69 0.44 - 1.00 mg/dL   Calcium 9.3 8.9 - 10.3 mg/dL   GFR calc  non Af Amer >60 >60 mL/min   GFR calc Af Amer >60 >60 mL/min    Comment: (NOTE) The eGFR has been calculated using the CKD EPI equation. This calculation has not been validated in all clinical situations. eGFR's persistently <60 mL/min signify possible Chronic Kidney Disease.    Anion gap 10 5 - 15  CBC with Differential     Status: Abnormal   Collection Time: 02/23/15  6:30 PM  Result Value Ref Range   WBC 6.0 4.0 - 10.5 K/uL   RBC 5.01 3.87 - 5.11 MIL/uL   Hemoglobin 13.0 12.0 - 15.0 g/dL   HCT 40.6 36.0 - 46.0 %   MCV 81.0 78.0 - 100.0 fL   MCH 25.9 (L) 26.0 - 34.0 pg   MCHC 32.0 30.0 - 36.0 g/dL   RDW 12.9 11.5 - 15.5 %   Platelets 365 150 - 400 K/uL   Neutrophils Relative % 61 43 - 77 %   Neutro Abs 3.7 1.7 - 7.7 K/uL   Lymphocytes Relative 29 12 - 46 %   Lymphs Abs 1.7 0.7 - 4.0 K/uL   Monocytes Relative 9 3 - 12 %   Monocytes Absolute 0.5 0.1 - 1.0 K/uL   Eosinophils Relative 1 0 - 5 %   Eosinophils Absolute 0.0 0.0 - 0.7 K/uL   Basophils Relative 0 0 - 1 %   Basophils Absolute 0.0 0.0 - 0.1 K/uL  Ethanol     Status: None    Collection Time: 02/23/15  6:30 PM  Result Value Ref Range   Alcohol, Ethyl (B) <5 <5 mg/dL    Comment:        LOWEST DETECTABLE LIMIT FOR SERUM ALCOHOL IS 5 mg/dL FOR MEDICAL PURPOSES ONLY   Pregnancy, urine     Status: None   Collection Time: 02/23/15  7:00 PM  Result Value Ref Range   Preg Test, Ur NEGATIVE NEGATIVE    Comment:        THE SENSITIVITY OF THIS METHODOLOGY IS >20 mIU/mL.   Urinalysis, Routine w reflex microscopic (not at [facility])     Status: Abnormal   Collection Time: 02/23/15  7:00 PM  Result Value Ref Range   Color, Urine YELLOW YELLOW   APPearance CLOUDY (A) CLEAR   Specific Gravity, Urine 1.022 1.005 - 1.030   pH 5.0 5.0 - 8.0   Glucose, UA NEGATIVE NEGATIVE mg/dL   Hgb urine dipstick NEGATIVE NEGATIVE   Bilirubin Urine NEGATIVE NEGATIVE   Ketones, ur NEGATIVE NEGATIVE mg/dL   Protein, ur NEGATIVE NEGATIVE mg/dL   Urobilinogen, UA 0.2 0.0 - 1.0 mg/dL   Nitrite NEGATIVE NEGATIVE   Leukocytes, UA MODERATE (A) NEGATIVE  Urine microscopic-add on     Status: Abnormal   Collection Time: 02/23/15  7:00 PM  Result Value Ref Range   Squamous Epithelial / LPF FEW (A) RARE   WBC, UA 11-20 <3 WBC/hpf   Bacteria, UA MANY (A) RARE   Crystals CA OXALATE CRYSTALS (A) NEGATIVE   Current Medications: Current Facility-Administered Medications  Medication Dose Route Frequency Provider Last Rate Last Dose  . acetaminophen (TYLENOL) tablet 650 mg  650 mg Oral Q6H PRN Lurena Nida, NP      . alum & mag hydroxide-simeth (MAALOX/MYLANTA) 200-200-20 MG/5ML suspension 30 mL  30 mL Oral Q4H PRN Lurena Nida, NP      . amLODipine (NORVASC) tablet 10 mg  10 mg Oral Daily Lurena Nida, NP   10 mg at 02/24/15  6144  . hydrOXYzine (ATARAX/VISTARIL) tablet 25 mg  25 mg Oral Q6H PRN Lurena Nida, NP   25 mg at 02/24/15 0149  . levothyroxine (SYNTHROID, LEVOTHROID) tablet 50 mcg  50 mcg Oral QAC breakfast Lurena Nida, NP   50 mcg at 02/24/15 3154  . lisinopril (PRINIVIL,ZESTRIL)  tablet 40 mg  40 mg Oral Daily Lurena Nida, NP   40 mg at 02/24/15 0810  . magnesium hydroxide (MILK OF MAGNESIA) suspension 30 mL  30 mL Oral Daily PRN Lurena Nida, NP      . traZODone (DESYREL) tablet 100 mg  100 mg Oral QHS PRN Lurena Nida, NP   100 mg at 02/24/15 0150   PTA Medications: Prescriptions prior to admission  Medication Sig Dispense Refill Last Dose  . amLODipine (NORVASC) 10 MG tablet Take 1 tablet (10 mg total) by mouth daily. 30 tablet 0 More than a month at Unknown time  . ciprofloxacin (CIPRO) 250 MG tablet Take 1 tablet (250 mg total) by mouth 2 (two) times daily.   Unknown at Unknown time  . escitalopram (LEXAPRO) 20 MG tablet Take 1 tablet (20 mg total) by mouth daily. 30 tablet 0 More than a month at Unknown time  . hydrOXYzine (ATARAX/VISTARIL) 25 MG tablet Take 1 tablet (25 mg total) by mouth every 6 (six) hours as needed for anxiety (sleep). 30 tablet 0 More than a month at Unknown time  . levothyroxine (SYNTHROID, LEVOTHROID) 50 MCG tablet Take 1 tablet (50 mcg total) by mouth daily before breakfast. 30 tablet 0 More than a month at Unknown time  . lisinopril (PRINIVIL,ZESTRIL) 40 MG tablet Take 1 tablet (40 mg total) by mouth daily. 30 tablet 0 More than a month at Unknown time  . lurasidone (LATUDA) 80 MG TABS tablet Take 1 tablet (80 mg total) by mouth at bedtime. 30 tablet 0 More than a month at Unknown time  . traZODone (DESYREL) 100 MG tablet Take 1 tablet (100 mg total) by mouth at bedtime as needed for sleep. 30 tablet 0 More than a month at Unknown time    Previous Psychotropic Medications: Yes   Substance Abuse History in the last 12 months:  No.    Consequences of Substance Abuse: NA  Results for orders placed or performed during the hospital encounter of 02/23/15 (from the past 72 hour(s))  Basic metabolic panel     Status: Abnormal   Collection Time: 02/23/15  6:30 PM  Result Value Ref Range   Sodium 143 135 - 145 mmol/L   Potassium 3.9  3.5 - 5.1 mmol/L   Chloride 105 101 - 111 mmol/L   CO2 28 22 - 32 mmol/L   Glucose, Bld 109 (H) 65 - 99 mg/dL   BUN 10 6 - 20 mg/dL   Creatinine, Ser 0.69 0.44 - 1.00 mg/dL   Calcium 9.3 8.9 - 10.3 mg/dL   GFR calc non Af Amer >60 >60 mL/min   GFR calc Af Amer >60 >60 mL/min    Comment: (NOTE) The eGFR has been calculated using the CKD EPI equation. This calculation has not been validated in all clinical situations. eGFR's persistently <60 mL/min signify possible Chronic Kidney Disease.    Anion gap 10 5 - 15  CBC with Differential     Status: Abnormal   Collection Time: 02/23/15  6:30 PM  Result Value Ref Range   WBC 6.0 4.0 - 10.5 K/uL   RBC 5.01 3.87 - 5.11 MIL/uL  Hemoglobin 13.0 12.0 - 15.0 g/dL   HCT 40.6 36.0 - 46.0 %   MCV 81.0 78.0 - 100.0 fL   MCH 25.9 (L) 26.0 - 34.0 pg   MCHC 32.0 30.0 - 36.0 g/dL   RDW 12.9 11.5 - 15.5 %   Platelets 365 150 - 400 K/uL   Neutrophils Relative % 61 43 - 77 %   Neutro Abs 3.7 1.7 - 7.7 K/uL   Lymphocytes Relative 29 12 - 46 %   Lymphs Abs 1.7 0.7 - 4.0 K/uL   Monocytes Relative 9 3 - 12 %   Monocytes Absolute 0.5 0.1 - 1.0 K/uL   Eosinophils Relative 1 0 - 5 %   Eosinophils Absolute 0.0 0.0 - 0.7 K/uL   Basophils Relative 0 0 - 1 %   Basophils Absolute 0.0 0.0 - 0.1 K/uL  Ethanol     Status: None   Collection Time: 02/23/15  6:30 PM  Result Value Ref Range   Alcohol, Ethyl (B) <5 <5 mg/dL    Comment:        LOWEST DETECTABLE LIMIT FOR SERUM ALCOHOL IS 5 mg/dL FOR MEDICAL PURPOSES ONLY   Pregnancy, urine     Status: None   Collection Time: 02/23/15  7:00 PM  Result Value Ref Range   Preg Test, Ur NEGATIVE NEGATIVE    Comment:        THE SENSITIVITY OF THIS METHODOLOGY IS >20 mIU/mL.   Urinalysis, Routine w reflex microscopic (not at [facility])     Status: Abnormal   Collection Time: 02/23/15  7:00 PM  Result Value Ref Range   Color, Urine YELLOW YELLOW   APPearance CLOUDY (A) CLEAR   Specific Gravity, Urine 1.022 1.005  - 1.030   pH 5.0 5.0 - 8.0   Glucose, UA NEGATIVE NEGATIVE mg/dL   Hgb urine dipstick NEGATIVE NEGATIVE   Bilirubin Urine NEGATIVE NEGATIVE   Ketones, ur NEGATIVE NEGATIVE mg/dL   Protein, ur NEGATIVE NEGATIVE mg/dL   Urobilinogen, UA 0.2 0.0 - 1.0 mg/dL   Nitrite NEGATIVE NEGATIVE   Leukocytes, UA MODERATE (A) NEGATIVE  Urine microscopic-add on     Status: Abnormal   Collection Time: 02/23/15  7:00 PM  Result Value Ref Range   Squamous Epithelial / LPF FEW (A) RARE   WBC, UA 11-20 <3 WBC/hpf   Bacteria, UA MANY (A) RARE   Crystals CA OXALATE CRYSTALS (A) NEGATIVE    Observation Level/Precautions:  15 minute checks  Laboratory:  CBC Chemistry Profile UDS  Psychotherapy:  Individual and group sessions  Medications:  Medications will be started as appropriate for patient stabilization  Consultations:    Discharge Concerns:    Estimated LOS:  Other:     Psychological Evaluations: Yes   Treatment Plan Summary: Daily contact with patient to assess and evaluate symptoms and progress in treatment and Medication management  PLAN OF CARE:  Patient will benefit from inpatient treatment and stabilization.  Estimated length of stay is 5-7 days.  Reviewed past medical records,treatment plan.  Will start Lamictal 25 mg po daily for mood lability/bipolar depression. Add Risperidone 1 mg po qhs for mood swings /anxiety. Add Cogentin 0.5 mg po qhs for EPS. Trazodone 100 mg po qhs for sleep. Will continue to monitor vitals ,medication compliance and treatment side effects while patient is here.  Will monitor for medical issues as well as call consult as needed.  Reviewed labs Lipid panel - abnormal- 11/25/14 will call dietician consult since pt is morbidly  obese . Hba1c - reviewed from 11/24/13- 5.5, TSH reviewed 5/16 - 2.551- ,will order EKG for qtc. UDS - neg, bal <5 CSW will start working on disposition.  Patient to participate in therapeutic milieu .    Medical Decision  Making:  Review of Psycho-Social Stressors (1), Review or order clinical lab tests (1), Review and summation of old records (2), Independent Review of image, tracing or specimen (2) and Review of Medication Regimen & Side Effects (2)  I certify that inpatient services furnished can reasonably be expected to improve the patient's condition.   Rankin, Shuvon, FNP-BC 8/21/201610:36 AM

## 2015-02-24 NOTE — Tx Team (Signed)
Initial Interdisciplinary Treatment Plan   PATIENT STRESSORS: Financial difficulties Medication change or noncompliance Occupational concerns   PATIENT STRENGTHS: Active sense of humor Capable of independent living Communication skills Motivation for treatment/growth Religious Affiliation Supportive family/friends   PROBLEM LIST: Problem List/Patient Goals Date to be addressed Date deferred Reason deferred Estimated date of resolution  "Get stable on my medications"      "Talk to someone about my issues"      Depression      Anxiety      Patential for self harm                               DISCHARGE CRITERIA:  Ability to meet basic life and health needs Adequate post-discharge living arrangements Verbal commitment to aftercare and medication compliance Withdrawal symptoms are absent or subacute and managed without 24-hour nursing intervention  PRELIMINARY DISCHARGE PLAN: Outpatient therapy Return to previous living arrangement  PATIENT/FAMIILY INVOLVEMENT: This treatment plan has been presented to and reviewed with the patient, Renee Steele, and/or family member.  The patient and family have been given the opportunity to ask questions and make suggestions.  Renee Steele 02/24/2015, 1:11 AM

## 2015-02-24 NOTE — Progress Notes (Signed)
Patient did not attend the evening speaker [place] meeting. Pt was notified that a wrap up group was beginning on the 400 hall where she will continue to program but pt remained in bed.

## 2015-02-24 NOTE — [facility] Group Notes (Signed)
[place] Group Notes: (Clinical Social Work)   02/24/2015      Type of Therapy:  Group Therapy   Participation Level:  Did Not Attend despite MHT prompting   Selmer Dominion, LCSW 02/24/2015, 4:58 PM

## 2015-02-25 DIAGNOSIS — F314 Bipolar disorder, current episode depressed, severe, without psychotic features: Principal | ICD-10-CM

## 2015-02-25 MED ORDER — LURASIDONE HCL 40 MG PO TABS
40.0000 mg | ORAL_TABLET | Freq: Every day | ORAL | Status: DC
  Administered 2015-02-25: 40 mg via ORAL
  Filled 2015-02-25 (×2): qty 1

## 2015-02-25 MED ORDER — BUPROPION HCL ER (XL) 150 MG PO TB24
150.0000 mg | ORAL_TABLET | Freq: Every day | ORAL | Status: DC
  Administered 2015-02-25 – 2015-02-27 (×3): 150 mg via ORAL
  Filled 2015-02-25 (×6): qty 1

## 2015-02-25 NOTE — Plan of Care (Signed)
Problem: Diagnosis: Increased Risk For Suicide Attempt Goal: STG-Patient Will Attend All Groups On The Unit Outcome: Not Progressing Per chart, pt did not attend any groups on 02/24/15. Pt was encouraged by staff to attend the evening group.
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
## 2015-02-25 NOTE — Progress Notes (Signed)
[facility] MD Progress Note  02/25/2015 7:57 PM Renee Steele  MRN:  [id number] Subjective:  Not sure what  course of action to follow. She states she has been depressed but she has bipolar disorder. She was here in May but did not follow up so when she left she used the samples given and did not follow up to get refills. So she ran out of them She states she was very depressed and did not have transportation. She is now living with another friend and knows this friend needs help with her 35 Y/O daughter. Medication wise she was on Lexapro and [place]. She was just started on Lamictal and states she was told the antidepressants could affect her but she feels she needs help with the depression. She was placed on Risperdal as she does not have insurance. She feels she did well on Latuda.States he is tired of having to deal with her depression, mood swings. Principal Problem: Bipolar 1 disorder, depressed, severe Diagnosis:   Patient Active Problem List   Diagnosis Date Noted  . Morbid obesity [E66.01] 02/24/2015  . Hypothyroidism [E03.9] 02/24/2015  . PCOS (polycystic ovarian syndrome) [E28.2] 02/24/2015  . Diabetes [E11.9] 02/24/2015  . Bipolar 1 disorder, depressed, severe [F31.4] 11/24/2014   Total Time spent with patient: 30 minutes   Past Medical History:  Past Medical History  Diagnosis Date  . Depression   . Hypertension   . Post traumatic stress disorder (PTSD)   . Diabetes mellitus without complication   . Hypothyroidism   . Morbid obesity     Past Surgical History  Procedure Laterality Date  . Tonsillectomy    . Wisdom tooth extraction     Family History:  Family History  Problem Relation Age of Onset  . Bipolar disorder Father   . Bipolar disorder Mother    Social History:  History  Alcohol Use No     History  Drug Use No    Social History   Social History  . Marital Status: Single    Spouse Name: N/A  . Number of Children: N/A  . Years of Education: N/A   Social  History Main Topics  . Smoking status: Never Smoker   . Smokeless tobacco: None  . Alcohol Use: No  . Drug Use: No  . Sexual Activity: Yes    Birth Control/ Protection: None   Other Topics Concern  . None   Social History Narrative   Additional History:    Sleep: Poor  Appetite:  Fair   Assessment:   Musculoskeletal: Strength & Muscle Tone: within normal limits Gait & Station: normal Patient leans: normal   Psychiatric Specialty Exam: Physical Exam  ROS  Blood pressure 174/87, pulse 98, temperature 97.7 F (36.5 C), temperature source Oral, resp. rate 20, height 5\' 8"  (1.727 m), weight 185.975 kg (410 lb), last menstrual period 10/03/2014.Body mass index is 62.35 kg/(m^2).  General Appearance: Fairly Groomed  Engineer, water::  Fair  Speech:  Clear and Coherent  Volume:  Decreased  Mood:  Anxious and Depressed  Affect:  Restricted  Thought Process:  Coherent and Goal Directed  Orientation:  Full (Time, Place, and Person)  Thought Content:  symptoms envents worries concerns  Suicidal Thoughts:  No  Homicidal Thoughts:  No  Memory:  Immediate;   Fair Recent;   Fair Remote;   Fair  Judgement:  Fair  Insight:  Shallow  Psychomotor Activity:  Restlessness  Concentration:  Fair  Recall:  [employer] of Knowledge:Fair  Language: Fair  Akathisia:  No  Handed:  Right  AIMS (if indicated):     Assets:  Desire for Improvement Housing Social Support  ADL's:  Intact  Cognition: WNL  Sleep:  Number of Hours: 5.25     Current Medications: Current Facility-Administered Medications  Medication Dose Route Frequency Provider Last Rate Last Dose  . acetaminophen (TYLENOL) tablet 650 mg  650 mg Oral Q6H PRN Lurena Nida, NP   650 mg at 02/24/15 1156  . alum & mag hydroxide-simeth (MAALOX/MYLANTA) 200-200-20 MG/5ML suspension 30 mL  30 mL Oral Q4H PRN Lurena Nida, NP      . amLODipine (NORVASC) tablet 10 mg  10 mg Oral Daily Lurena Nida, NP   10 mg at 02/25/15 4081   . buPROPion (WELLBUTRIN XL) 24 hr tablet 150 mg  150 mg Oral Daily Nicholaus Bloom, MD   150 mg at 02/25/15 1506  . hydrOXYzine (ATARAX/VISTARIL) tablet 25 mg  25 mg Oral Q6H PRN Lurena Nida, NP   25 mg at 02/25/15 1319  . ibuprofen (ADVIL,MOTRIN) tablet 600 mg  600 mg Oral Q6H PRN Shuvon B Rankin, NP   600 mg at 02/24/15 2218  . lamoTRIgine (LAMICTAL) tablet 25 mg  25 mg Oral Daily Ursula Alert, MD   25 mg at 02/25/15 4481  . levothyroxine (SYNTHROID, LEVOTHROID) tablet 50 mcg  50 mcg Oral QAC breakfast Lurena Nida, NP   50 mcg at 02/25/15 [phone]  . lisinopril (PRINIVIL,ZESTRIL) tablet 40 mg  40 mg Oral Daily Lurena Nida, NP   40 mg at 02/25/15 1497  . lurasidone (LATUDA) tablet 40 mg  40 mg Oral Q supper Nicholaus Bloom, MD   40 mg at 02/25/15 1723  . magnesium hydroxide (MILK OF MAGNESIA) suspension 30 mL  30 mL Oral Daily PRN Lurena Nida, NP      . traZODone (DESYREL) tablet 100 mg  100 mg Oral QHS PRN Lurena Nida, NP   100 mg at 02/24/15 2219    Lab Results: No results found for this or any previous visit (from the past 48 hour(s)).  Physical Findings: AIMS: Facial and Oral Movements Muscles of Facial Expression: None, normal Lips and Perioral Area: None, normal Jaw: None, normal Tongue: None, normal,Extremity Movements Upper (arms, wrists, hands, fingers): None, normal Lower (legs, knees, ankles, toes): None, normal, Trunk Movements Neck, shoulders, hips: None, normal, Overall Severity Severity of abnormal movements (highest score from questions above): None, normal Incapacitation due to abnormal movements: None, normal Patient's awareness of abnormal movements (rate only patient's report): No Awareness, Dental Status Current problems with teeth and/or dentures?: No Does patient usually wear dentures?: No  CIWA:  CIWA-Ar Total: 2 COWS:  COWS Total Score: 4  Treatment Plan Summary: Daily contact with patient to assess and evaluate symptoms and progress in treatment and  Medication management Supportive approach/coping skills Mood instability; will resume the Latuda and D/C the Risperdal ( she feels the Taiwan did work for her) Will continue the Lamictal to also help the Bipolar Depression Depression; will start Wellbutrin as she complains of lack of energy, motivation, having a hard time functioning due to the same Encourage to accept that she has a chronic illness that she can manage as long as she complies with medication, therapy and does what she can do to help herself like exercising Encourage behavioral activation Medical Decision Making:  Review of Psycho-Social Stressors (1) and Review of Medication Regimen & Side Effects (  2)     Kiauna Zywicki A 02/25/2015, 7:57 PM

## 2015-02-25 NOTE — Progress Notes (Signed)
   02/25/15 0830  Vital Signs  BP (!) 176/98 mmHg   D: Hubert remains very isolative and slightly guarded/irritable this a.m. She remained in bed for much of the morning and did not attend group, but she was compliant with medication. She admits SI but contracts for safety. At present she is sitting in dayroom with peers and staff. A: Meds given as ordered. Q15 safety checks maintained. Support/encouragement offered. R: Pt remains free from harm and continues with treatment. Will continue to monitor for needs/safety.

## 2015-02-25 NOTE — BHH Group Notes (Signed)
[facility] LCSW Aftercare Discharge Planning Group Note   02/25/2015 10:08 AM  Participation Quality:  Did not attend. Invited. Pt chose to remain in bed.   Smart, [employer]

## 2015-02-25 NOTE — BHH Group Notes (Signed)
[place] LCSW Group Therapy  02/25/2015 1:12 PM  Type of Therapy:  Group Therapy  Participation Level:  Did Not Attend-pt chose to remain in room to rest.   Summary of Progress/Problems: Today's Topic: Overcoming Obstacles. Patients identified one short term goal and potential obstacles in reaching this goal. Patients processed barriers involved in overcoming these obstacles. Patients identified steps necessary for overcoming these obstacles and explored motivation (internal and external) for facing these difficulties head on.   Smart, Lindy Garczynski LCSWA 02/25/2015, 1:12 PM

## 2015-02-25 NOTE — Progress Notes (Signed)
Recreation Therapy Notes  Date: 08.22.2016 Time: 9:30am Location: [address] Dayroom   Group Topic: Stress Management  Goal Area(s) Addresses:  Patient will actively participate in stress management techniques presented during session.   Behavioral Response: Did not attend.   Laureen Ochs Dawan Farney, LRT/CTRS  Eletha Culbertson L 02/25/2015 1:42 PM

## 2015-02-25 NOTE — Progress Notes (Signed)
D:Pt is currently denying any SI. Pt reports that she was SI earlier as she was feeling depressed. Pt is able to verbally contract for safety. Pt reports that she doing better. Pt is aware that her medications will not take effect immediately. Pt is denying any side effects. Pt was invited to go to the [place] group. Pt did not attend.  A: Writer administered scheduled and prn medications to pt. Continued support and availability as needed was extended to this pt. Staff continue to monitor pt with q35min checks.  R: No adverse drug reactions noted. Pt receptive to treatment. Pt remains safe at this time.

## 2015-02-25 NOTE — Progress Notes (Signed)
D: Pt presents flat in affect and depressed in mood. Pt brightens upon interaction. Pt remain in her room asleep for the majority of the evening. Pt denied any SI/HI/AVH. Pt had no concerns she wished for this writer to address at this time.  A: Writer administered scheduled and prn medications to pt, per MD orders. Pt was informed of the indications of her newly prescribed Risperdal. Continued support and availability as needed was extended to this pt. Staff continue to monitor pt with q75min checks.  R: No adverse drug reactions noted. Pt receptive to treatment. Pt remains safe at this time.

## 2015-02-25 NOTE — BHH Suicide Risk Assessment (Signed)
[place] INPATIENT:  Family/Significant Other Suicide Prevention Education  Suicide Prevention Education:  Patient Refusal for Family/Significant Other Suicide Prevention Education: The patient Renee Steele has refused to provide written consent for family/significant other to be provided Family/Significant Other Suicide Prevention Education during admission and/or prior to discharge.  Physician notified.  SPE completed with pt, as pt refused to consent to family contact. SPI pamphlet provided to pt and pt was encouraged to share information with support network, ask questions, and talk about any concerns relating to SPE. Pt denies access to guns/firearms and verbalized understanding of information provided. Mobile Crisis information also provided to pt.   Smart, Irene Mitcham LCSWA  02/25/2015, 3:35 PM

## 2015-02-25 NOTE — Progress Notes (Signed)
Pt remains depressed, but her mood has improved. She has been more talkative, with brighter affect.

## 2015-02-26 LAB — TSH: TSH: 1.019 u[IU]/mL (ref 0.350–4.500)

## 2015-02-26 MED ORDER — LURASIDONE HCL 40 MG PO TABS
60.0000 mg | ORAL_TABLET | Freq: Every day | ORAL | Status: DC
  Administered 2015-02-26 – 2015-02-27 (×2): 60 mg via ORAL
  Filled 2015-02-26: qty 2
  Filled 2015-02-26: qty 21
  Filled 2015-02-26 (×2): qty 2

## 2015-02-26 MED ORDER — LORATADINE 10 MG PO TABS
10.0000 mg | ORAL_TABLET | Freq: Every day | ORAL | Status: DC
  Administered 2015-02-26 – 2015-02-28 (×3): 10 mg via ORAL
  Filled 2015-02-26 (×7): qty 1

## 2015-02-26 NOTE — Progress Notes (Signed)
Patient attend AA group tonight.

## 2015-02-26 NOTE — Progress Notes (Signed)
Recreation Therapy Notes  Animal-Assisted Activity (AAA) Program Checklist/Progress Notes Patient Eligibility Criteria Checklist & Daily Group note for Rec Tx Intervention  Date: 08.23.2016 Time: 2:45pm Location: [address]    AAA/T Program Assumption of Risk Form signed by Patient/ or Parent Legal Guardian yes  Patient is free of allergies or sever asthma yes  Patient reports no fear of animals yes  Patient reports no history of cruelty to animals yes  Patient understands his/her participation is voluntary yes  Patient washes hands before animal contact yes  Patient washes hands after animal contact yes  Behavioral Response: Did not attend.   Laureen Ochs Lakely [place], LRT/CTRS  Briza Bark L 02/26/2015 3:05 PM

## 2015-02-26 NOTE — BHH Group Notes (Signed)
[place] Group Notes:  (Nursing/MHT/Case Management/Adjunct)  Date:  02/26/2015  Time:  10:35 AM  Type of Therapy:  Nurse Education  Participation Level:  Did Not Attend  Participation Quality:  did not attend  Affect:  Lethargic  Cognitive:  did not attend groups  Insight:  Appropriate  Engagement in Group:  did not attend  Modes of Intervention:  Discussion and Education  Summary of Progress/Problems:Did not attend, in bed without any energy to participate or to get up for group. Reports she has a headache, is very tired and apathetic.  Davina Poke 02/26/2015, 10:35 AM

## 2015-02-26 NOTE — BHH Group Notes (Signed)
[place] LCSW Group Therapy  02/26/2015 11:28 AM  Type of Therapy:  Group Therapy  Participation Level:  Active  Participation Quality:  Attentive  Affect:  Appropriate  Cognitive:  Alert and Oriented  Insight:  Improving  Engagement in Therapy:  Improving  Modes of Intervention:  Discussion, Education, Exploration, Problem-solving, Rapport Building, Socialization and Support  Summary of Progress/Problems: [facility] Speaker came to talk about his personal journey with substance abuse and addiction. The pt processed ways by which to relate to the speaker. [place] speaker provided handouts and educational information pertaining to groups and services offered by the [facility].   Smart, Retal Tonkinson LCSWA  02/26/2015, 11:28 AM

## 2015-02-26 NOTE — Plan of Care (Signed)
Problem: Food- and Nutrition-Related Knowledge Deficit (NB-1.1) Goal: Nutrition education Formal process to instruct or train a patient/client in a skill or to impart knowledge to help patients/clients voluntarily manage or modify food choices and eating behavior to maintain or improve health. Outcome: Completed/Met Date Met:  02/26/15  RD consulted for nutrition education regarding weight loss.  Body mass index is 62.35 kg/(m^2). Pt meets criteria for morbid obesity based on current BMI.  RD provided "MyPlate" handout. Emphasized the importance of serving sizes and provided examples of correct portions of common foods. Discussed importance of controlled and consistent intake throughout the day. Provided examples of ways to balance meals/snacks and encouraged intake of high-fiber, whole grain complex carbohydrates. Emphasized the importance of hydration with calorie-free beverages and limiting sugar-sweetened beverages. Encouraged pt to discuss physical activity options with physician. Teach back method used.  Expect good compliance.  Diet Order: Diet regular Room service appropriate?: Yes; Fluid consistency:: Thin Pt is also offered choice of unit snacks mid-morning and mid-afternoon.  Pt is eating as desired.    If additional nutrition issues arise, please re-consult RD.  Clayton Bibles, MS, RD, LDN Pager: [phone] After Hours Pager: [phone]

## 2015-02-26 NOTE — Progress Notes (Signed)
[facility] MD Progress Note  02/26/2015 7:43 PM Renee Steele  MRN:  [id number] Subjective:  States she feels like given up. States things are not getting any better for her. States that people think she can just pray this away or snap her fingers and that everything will be OK as states she is tired of trying. She admits she isolates does not exercise as she feels too depressed to be able to do it.  Principal Problem: Bipolar 1 disorder, depressed, severe Diagnosis:   Patient Active Problem List   Diagnosis Date Noted  . Morbid obesity [E66.01] 02/24/2015  . Hypothyroidism [E03.9] 02/24/2015  . PCOS (polycystic ovarian syndrome) [E28.2] 02/24/2015  . Diabetes [E11.9] 02/24/2015  . Bipolar 1 disorder, depressed, severe [F31.4] 11/24/2014   Total Time spent with patient: 30 minutes   Past Medical History:  Past Medical History  Diagnosis Date  . Depression   . Hypertension   . Post traumatic stress disorder (PTSD)   . Diabetes mellitus without complication   . Hypothyroidism   . Morbid obesity     Past Surgical History  Procedure Laterality Date  . Tonsillectomy    . Wisdom tooth extraction     Family History:  Family History  Problem Relation Age of Onset  . Bipolar disorder Father   . Bipolar disorder Mother    Social History:  History  Alcohol Use No     History  Drug Use No    Social History   Social History  . Marital Status: Single    Spouse Name: N/A  . Number of Children: N/A  . Years of Education: N/A   Social History Main Topics  . Smoking status: Never Smoker   . Smokeless tobacco: None  . Alcohol Use: No  . Drug Use: No  . Sexual Activity: Yes    Birth Control/ Protection: None   Other Topics Concern  . None   Social History Narrative   Additional History:    Sleep: Fair  Appetite:  Poor   Assessment:   Musculoskeletal: Strength & Muscle Tone: within normal limits Gait & Station: normal Patient leans: normal   Psychiatric Specialty  Exam: Physical Exam  Review of Systems  Constitutional: Positive for malaise/fatigue.  Eyes: Negative.   Respiratory: Negative.   Cardiovascular: Negative.   Gastrointestinal: Negative.   Genitourinary: Negative.   Musculoskeletal: Negative.   Skin: Negative.   Neurological: Positive for weakness and headaches.  Endo/Heme/Allergies: Negative.   Psychiatric/Behavioral: Positive for depression. The patient is nervous/anxious.     Blood pressure 136/94, pulse 119, temperature 97.9 F (36.6 C), temperature source Oral, resp. rate 18, height 5\' 8"  (1.727 m), weight 185.975 kg (410 lb), last menstrual period 10/03/2014.Body mass index is 62.35 kg/(m^2).  General Appearance: Fairly Groomed  Engineer, water::  Fair  Speech:  Clear and Coherent and Slow  Volume:  Decreased  Mood:  Anxious and Depressed  Affect:  Restricted  Thought Process:  Coherent and Goal Directed  Orientation:  Full (Time, Place, and Person)  Thought Content:  symptoms events worries concerns  Suicidal Thoughts:  Yes.  without intent/plan  Homicidal Thoughts:  No  Memory:  Immediate;   Fair Recent;   Fair Remote;   Fair  Judgement:  Fair  Insight:  Present and Shallow  Psychomotor Activity:  Decreased  Concentration:  Fair  Recall:  [place]  Language: Fair  Akathisia:  No  Handed:  Right  AIMS (if indicated):  Assets:  Desire for Improvement  ADL's:  Intact  Cognition: WNL  Sleep:  Number of Hours: 5.25     Current Medications: Current Facility-Administered Medications  Medication Dose Route Frequency Provider Last Rate Last Dose  . acetaminophen (TYLENOL) tablet 650 mg  650 mg Oral Q6H PRN Lurena Nida, NP   650 mg at 02/26/15 2094  . alum & mag hydroxide-simeth (MAALOX/MYLANTA) 200-200-20 MG/5ML suspension 30 mL  30 mL Oral Q4H PRN Lurena Nida, NP      . amLODipine (NORVASC) tablet 10 mg  10 mg Oral Daily Lurena Nida, NP   10 mg at 02/26/15 0920  . buPROPion (WELLBUTRIN XL)  24 hr tablet 150 mg  150 mg Oral Daily Nicholaus Bloom, MD   150 mg at 02/26/15 0919  . hydrOXYzine (ATARAX/VISTARIL) tablet 25 mg  25 mg Oral Q6H PRN Lurena Nida, NP   25 mg at 02/25/15 2251  . ibuprofen (ADVIL,MOTRIN) tablet 600 mg  600 mg Oral Q6H PRN Shuvon B Rankin, NP   600 mg at 02/26/15 0925  . lamoTRIgine (LAMICTAL) tablet 25 mg  25 mg Oral Daily Ursula Alert, MD   25 mg at 02/26/15 0920  . levothyroxine (SYNTHROID, LEVOTHROID) tablet 50 mcg  50 mcg Oral QAC breakfast Lurena Nida, NP   50 mcg at 02/26/15 0640  . lisinopril (PRINIVIL,ZESTRIL) tablet 40 mg  40 mg Oral Daily Lurena Nida, NP   40 mg at 02/26/15 7096  . lurasidone (LATUDA) tablet 60 mg  60 mg Oral Q supper Nicholaus Bloom, MD   60 mg at 02/26/15 1816  . magnesium hydroxide (MILK OF MAGNESIA) suspension 30 mL  30 mL Oral Daily PRN Lurena Nida, NP      . traZODone (DESYREL) tablet 100 mg  100 mg Oral QHS PRN Lurena Nida, NP   100 mg at 02/25/15 2251    Lab Results: No results found for this or any previous visit (from the past 48 hour(s)).  Physical Findings: AIMS: Facial and Oral Movements Muscles of Facial Expression: None, normal Lips and Perioral Area: None, normal Jaw: None, normal Tongue: None, normal,Extremity Movements Upper (arms, wrists, hands, fingers): None, normal Lower (legs, knees, ankles, toes): None, normal, Trunk Movements Neck, shoulders, hips: None, normal, Overall Severity Severity of abnormal movements (highest score from questions above): None, normal Incapacitation due to abnormal movements: None, normal Patient's awareness of abnormal movements (rate only patient's report): No Awareness, Dental Status Current problems with teeth and/or dentures?: No Does patient usually wear dentures?: No  CIWA:  CIWA-Ar Total: 2 COWS:  COWS Total Score: 4  Treatment Plan Summary: Daily contact with patient to assess and evaluate symptoms and progress in treatment and Medication management Supportive  approach/coping skills Mood instability; will increase the Latuda to 60 mg  Depression; will continue the Wellbutrin XL 150 mg daily and increased it in the next couple of days to 300 mg Will pursue further work to improve self esteem, 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  Will continue to work with CBT/mindfulness  Medical Decision Making:  Review of Psycho-Social Stressors (1), Review of Medication Regimen & Side Effects (2) and Review of New Medication or Change in Dosage (2)     Stacey Maura A 02/26/2015, 7:43 PM

## 2015-02-26 NOTE — Progress Notes (Signed)
Asked her at am med pass time to come get her medications but she never did. Late now giving them to her and took them to her room to administer them since she never would come get them. She reports continued headache without relief from am Tylenol. States her headache is a 10. Consulted with NP for order for Ibuprofen. States she is tired, low energy and depressed. She states she has felt like this before. She describes herself as a night owl and is much more likely to be up in the late pm. Denies thoughts to hurt self.

## 2015-02-26 NOTE — Progress Notes (Signed)
Patient ID: Renee Steele, female   DOB: 02/16/1981, 34 y.o.   MRN: [id number] D-Did not complete self inventory and has been in bed all morning. First up when the unit went downstairs to go outside for awhile. She states she is depressed, tired and has a severed headache in the am of a 10 unrelieved by Tylenol that she was given earlier. A-Support offered. Monitored for safety medications as ordered. R-Smiling and verbal and brighter on returning to unit from outside. She states her headache has been relieved. Interacting with peers.No further complaints

## 2015-02-27 MED ORDER — BUPROPION HCL ER (XL) 300 MG PO TB24
300.0000 mg | ORAL_TABLET | Freq: Every day | ORAL | Status: DC
  Administered 2015-02-28: 300 mg via ORAL
  Filled 2015-02-27: qty 14
  Filled 2015-02-27 (×2): qty 1

## 2015-02-27 NOTE — Progress Notes (Signed)
[facility] MD Progress Note  02/27/2015 6:58 PM Renee Steele  MRN:  [id number] Subjective:  States she is starting to feel better. She had a conversation with her friend last night. She is going back to stay with her once she is D/C. She is supposed to help her friend take care of her 35 Y/O daughter. She states that looking back things started not going well for her once she started gaining all the weight as she was Dx with hypothyroidism and PCOS.  Principal Problem: Bipolar 1 disorder, depressed, severe Diagnosis:   Patient Active Problem List   Diagnosis Date Noted  . Morbid obesity [E66.01] 02/24/2015  . Hypothyroidism [E03.9] 02/24/2015  . PCOS (polycystic ovarian syndrome) [E28.2] 02/24/2015  . Diabetes [E11.9] 02/24/2015  . Bipolar 1 disorder, depressed, severe [F31.4] 11/24/2014   Total Time spent with patient: 30 minutes   Past Medical History:  Past Medical History  Diagnosis Date  . Depression   . Hypertension   . Post traumatic stress disorder (PTSD)   . Diabetes mellitus without complication   . Hypothyroidism   . Morbid obesity     Past Surgical History  Procedure Laterality Date  . Tonsillectomy    . Wisdom tooth extraction     Family History:  Family History  Problem Relation Age of Onset  . Bipolar disorder Father   . Bipolar disorder Mother    Social History:  History  Alcohol Use No     History  Drug Use No    Social History   Social History  . Marital Status: Single    Spouse Name: N/A  . Number of Children: N/A  . Years of Education: N/A   Social History Main Topics  . Smoking status: Never Smoker   . Smokeless tobacco: None  . Alcohol Use: No  . Drug Use: No  . Sexual Activity: Yes    Birth Control/ Protection: None   Other Topics Concern  . None   Social History Narrative   Additional History:    Sleep: Fair  Appetite:  Fair   Assessment:   Musculoskeletal: Strength & Muscle Tone: within normal limits Gait & Station:  normal Patient leans: normal   Psychiatric Specialty Exam: Physical Exam  Review of Systems  Constitutional: Negative.   HENT: Negative.   Eyes: Negative.   Respiratory: Negative.   Cardiovascular: Negative.   Gastrointestinal: Negative.   Genitourinary: Negative.   Musculoskeletal: Negative.   Skin: Negative.   Neurological: Negative.   Endo/Heme/Allergies: Negative.   Psychiatric/Behavioral: Positive for depression. The patient is nervous/anxious.     Blood pressure 136/94, pulse 119, temperature 97.9 F (36.6 C), temperature source Oral, resp. rate 18, height 5\' 8"  (1.727 m), weight 185.975 kg (410 lb), last menstrual period 10/03/2014.Body mass index is 62.35 kg/(m^2).  General Appearance: Fairly Groomed  Engineer, water::  Fair  Speech:  Clear and Coherent  Volume:  Decreased  Mood:  Anxious and Depressed  Affect:  Appropriate  Thought Process:  Coherent and Goal Directed  Orientation:  Full (Time, Place, and Person)  Thought Content:  symptoms events worries concerns  Suicidal Thoughts:  No  Homicidal Thoughts:  No  Memory:  Immediate;   Fair Recent;   Fair Remote;   Fair  Judgement:  Fair  Insight:  Present and Shallow  Psychomotor Activity:  Normal  Concentration:  Fair  Recall:  [employer] of Knowledge:Fair  Language: Fair  Akathisia:  No  Handed:  Right  AIMS (if indicated):  Assets:  Desire for Improvement Housing Social Support  ADL's:  Intact  Cognition: WNL  Sleep:  Number of Hours: 5.25     Current Medications: Current Facility-Administered Medications  Medication Dose Route Frequency Provider Last Rate Last Dose  . acetaminophen (TYLENOL) tablet 650 mg  650 mg Oral Q6H PRN Lurena Nida, NP   650 mg at 02/26/15 6578  . alum & mag hydroxide-simeth (MAALOX/MYLANTA) 200-200-20 MG/5ML suspension 30 mL  30 mL Oral Q4H PRN Lurena Nida, NP      . amLODipine (NORVASC) tablet 10 mg  10 mg Oral Daily Lurena Nida, NP   10 mg at 02/27/15 0817  .  buPROPion (WELLBUTRIN XL) 24 hr tablet 150 mg  150 mg Oral Daily Nicholaus Bloom, MD   150 mg at 02/27/15 0817  . hydrOXYzine (ATARAX/VISTARIL) tablet 25 mg  25 mg Oral Q6H PRN Lurena Nida, NP   25 mg at 02/26/15 2115  . ibuprofen (ADVIL,MOTRIN) tablet 600 mg  600 mg Oral Q6H PRN Shuvon B Rankin, NP   600 mg at 02/26/15 0925  . lamoTRIgine (LAMICTAL) tablet 25 mg  25 mg Oral Daily Ursula Alert, MD   25 mg at 02/27/15 0817  . levothyroxine (SYNTHROID, LEVOTHROID) tablet 50 mcg  50 mcg Oral QAC breakfast Lurena Nida, NP   50 mcg at 02/27/15 4696  . lisinopril (PRINIVIL,ZESTRIL) tablet 40 mg  40 mg Oral Daily Lurena Nida, NP   40 mg at 02/27/15 0817  . loratadine (CLARITIN) tablet 10 mg  10 mg Oral Daily Evanna Burkett, NP   10 mg at 02/27/15 0817  . lurasidone (LATUDA) tablet 60 mg  60 mg Oral Q supper Nicholaus Bloom, MD   60 mg at 02/27/15 1709  . magnesium hydroxide (MILK OF MAGNESIA) suspension 30 mL  30 mL Oral Daily PRN Lurena Nida, NP      . traZODone (DESYREL) tablet 100 mg  100 mg Oral QHS PRN Lurena Nida, NP   100 mg at 02/26/15 2217    Lab Results:  Results for orders placed or performed during the hospital encounter of 02/23/15 (from the past 48 hour(s))  Urine culture     Status: None (Preliminary result)   Collection Time: 02/26/15  1:10 PM  Result Value Ref Range   Specimen Description      URINE, RANDOM Performed at [place] Requests      Normal Performed at [place] Performed at [facility]    Report Status PENDING   TSH     Status: None   Collection Time: 02/26/15  7:44 PM  Result Value Ref Range   TSH 1.019 0.350 - 4.500 uIU/mL    Comment: Performed at [facility]    Physical Findings: AIMS: Facial and Oral Movements Muscles of Facial Expression: None, normal Lips and Perioral Area: None, normal Jaw: None,  normal Tongue: None, normal,Extremity Movements Upper (arms, wrists, hands, fingers): None, normal Lower (legs, knees, ankles, toes): None, normal, Trunk Movements Neck, shoulders, hips: None, normal, Overall Severity Severity of abnormal movements (highest score from questions above): None, normal Incapacitation due to abnormal movements: None, normal Patient's awareness of abnormal movements (rate only patient's report): No Awareness, Dental Status Current problems with teeth and/or dentures?: No Does patient usually wear dentures?: No  CIWA:  CIWA-Ar  Total: 2 COWS:  COWS Total Score: 4  Treatment Plan Summary: Daily contact with patient to assess and evaluate symptoms and progress in treatment and Medication management Supportive approach/coping skills Depression; continue the Wellbutrin increase to 300 mg in AM Mood instability; work with the Lamictal and the Latuda combination  Insomnia; work with the Trazodone 100 mg HS CBT/mindfulness Medical Decision Making:  Review of Psycho-Social Stressors (1), Review or order clinical lab tests (1) and Review of Medication Regimen & Side Effects (2)     Lashawne Dura A 02/27/2015, 6:58 PM

## 2015-02-27 NOTE — Progress Notes (Signed)
D: Patient is alert and cooperative.  Denies auditory and visual hallucination.  Able to contract for safety.  Patient reports 6 out of 10 for anxiety, hopelessness and depression.  Denies pain or discomfort.   A: Compliant with medication and treatment management.  Maintained on routine safety checks per protocol.  Support and encouragement offered. R: No issues or complaints offered.

## 2015-02-27 NOTE — BHH Group Notes (Signed)
[place] LCSW Group Therapy  02/27/2015 1:14 PM  Type of Therapy:  Group Therapy  Participation Level:  Active  Participation Quality:  Attentive  Affect:  Appropriate  Cognitive:  Alert and Oriented  Insight:  Improving  Engagement in Therapy:  Improving  Modes of Intervention:  Confrontation, Discussion, Education, Exploration, Problem-solving, Rapport Building, Socialization and Support  Summary of Progress/Problems: Today's Topic: Overcoming Obstacles. Patients identified one short term goal and potential obstacles in reaching this goal. Patients processed barriers involved in overcoming these obstacles. Patients identified steps necessary for overcoming these obstacles and explored motivation (internal and external) for facing these difficulties head on. Renee Steele was attentive and engaged during today's processing group. She shared that her biggest obstacle is "getting over my guild and constantly feeling like a burden to my friend/roomate." Renee Steele explored why she feels this way and evidence to the contrary. She continues to show progress in the group setting and improving insight.   Smart, Renee Steele LCSWA 02/27/2015, 1:14 PM

## 2015-02-27 NOTE — Progress Notes (Signed)
Patient attend N/A group tonight.

## 2015-02-27 NOTE — BHH Group Notes (Signed)
[facility] LCSW Aftercare Discharge Planning Group Note   02/27/2015 11:05 AM  Participation Quality:  Invited-DID NOT ATTEND   Smart, Alicia Amel

## 2015-02-27 NOTE — Progress Notes (Addendum)
Pt pleasant and cooperative with sullen affect and depressed anxious mood.  Pt stated she feels better this evening than she did earlier in the day.  Pt shared she has been doing more such as going to groups and going outside to make herself feel better.  Pt rated her anxiety a 7/10 and was given PRN Vistaril with relief.  Pt shared she feels very tired and knows she needs to exercise more to feel better.  Pt was informed she is able to walk the halls for exercise if needed and pt agreed.  Pt continued to complain of vaginal itching and some urgency with voiding.  Pt also complained of seasonal allergies with symptoms of " itchy eyes and stuffy nose". Pt reported she takes Zyrtec or Claritin at home.  NP on call notified and pt given Claritin 10 mg.  Pt requested a multivitamin with hopes of increasing her energy.  Note left for MD for MVI.  Pt also complained of insomnia and was given PRN Trazodone.  Pt denied SI/HI/AVH and remains safe on the unit.

## 2015-02-28 ENCOUNTER — Encounter (HOSPITAL_COMMUNITY): Payer: Self-pay | Admitting: Registered Nurse

## 2015-02-28 LAB — URINE CULTURE: SPECIAL REQUESTS: NORMAL

## 2015-02-28 MED ORDER — LISINOPRIL 20 MG PO TABS
40.0000 mg | ORAL_TABLET | Freq: Every day | ORAL | Status: DC
  Filled 2015-02-28: qty 14

## 2015-02-28 MED ORDER — LISINOPRIL 40 MG PO TABS: 40.0000 mg | ORAL_TABLET | Freq: Every day | ORAL | Status: DC

## 2015-02-28 MED ORDER — TRAZODONE HCL 100 MG PO TABS: 100.0000 mg | ORAL_TABLET | Freq: Every evening | ORAL | Status: DC | PRN

## 2015-02-28 MED ORDER — BUPROPION HCL ER (XL) 300 MG PO TB24: 300.0000 mg | ORAL_TABLET | Freq: Every day | ORAL | Status: DC

## 2015-02-28 MED ORDER — LURASIDONE HCL 60 MG PO TABS: 60.0000 mg | ORAL_TABLET | Freq: Every day | ORAL | Status: DC

## 2015-02-28 MED ORDER — LORATADINE 10 MG PO TABS: 10.0000 mg | ORAL_TABLET | Freq: Every day | ORAL | Status: DC

## 2015-02-28 MED ORDER — HYDROXYZINE HCL 25 MG PO TABS: 25.0000 mg | ORAL_TABLET | Freq: Three times a day (TID) | ORAL | Status: DC | PRN

## 2015-02-28 MED ORDER — CEPHALEXIN 500 MG PO CAPS: 500.0000 mg | ORAL_CAPSULE | Freq: Two times a day (BID) | ORAL | Status: DC

## 2015-02-28 MED ORDER — LAMOTRIGINE 25 MG PO TABS: 25.0000 mg | ORAL_TABLET | Freq: Every day | ORAL | Status: DC

## 2015-02-28 MED ORDER — CEPHALEXIN 500 MG PO CAPS
500.0000 mg | ORAL_CAPSULE | Freq: Two times a day (BID) | ORAL | Status: DC
  Administered 2015-02-28: 500 mg via ORAL
  Filled 2015-02-28 (×2): qty 1
  Filled 2015-02-28 (×2): qty 13

## 2015-02-28 NOTE — Progress Notes (Signed)
Discharge Note: Patient is alert and cooperative.  Patient denies SI/HI/AVH and contracted for safety.  Patient discharge home with prescriptions and medication samples.  Discharge instructions and medications reviewed with patient.  Verbalizes understanding of discharge instructions and medication therapy.  No complaint offered.  Personal belongings returned to patient.
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
## 2015-02-28 NOTE — Progress Notes (Signed)
Patient ID: Renee Steele, female   DOB: [birth year], 34 y.o.   MRN: [id number]  D: Patient pleasant on approach tonight. Asked for a bible at this time, Patient smiling and interacting with peers. Reports feeling that her mood is better but wellbutrin is increasing tomorrow as well. Currently denies any SI A: Staff will continue to monitor on q 15 minute checks, follow treatment plan, and give meds as ordered. R: Cooperative on the unit.

## 2015-02-28 NOTE — Discharge Summary (Signed)
Physician Discharge Summary Note  Patient:  Renee Steele is an 34 y.o., female MRN:  [id number] DOB:  10/25/1980 Patient phone:  [phone] (home)  Patient address:   [address] [place] [address],  Total Time spent with patient: 45 minutes  Date of Admission:  02/23/2015 Date of Discharge: 02/28/2015  Reason for Admission:  Per H&P Note:  Patient states that she was feel depressed "I wasn't happy and was having suicidal thoughts." Patient states that she just move here from [place] to live with a friend who is working and going to school. "I lost my job because of my depression; my friend said that I could move here with here and help her with her child while she was at work and school until I found a job. But I feel bad not being able to contribute nothing and her daughter is only 87 years old but she is so disrespectful and some times I feel like I'm going to snap. I don't know if it is because it is because I'm off of my medicine or what." Patient states that she has had problems with depression "all my life" At this time patient denise suicidal thoughts, homicidal ideation, psychosis, and paranoia. Continues to endorse depression, anxiety, feelings of worthlessness, helplessness, guilt, fatigue, and irritability.  Principal Problem: Bipolar 1 disorder, depressed, severe Discharge Diagnoses: Patient Active Problem List   Diagnosis Date Noted  . Morbid obesity [E66.01] 02/24/2015  . Hypothyroidism [E03.9] 02/24/2015  . PCOS (polycystic ovarian syndrome) [E28.2] 02/24/2015  . Diabetes [E11.9] 02/24/2015  . Bipolar 1 disorder, depressed, severe [F31.4] 11/24/2014    Musculoskeletal: Strength & Muscle Tone: within normal limits Gait & Station: normal Patient leans: N/A  Psychiatric Specialty Exam:  See Suicide Risk Assessment Physical Exam  Nursing note and vitals reviewed.   Review of Systems  Endo/Heme/Allergies:       Hypothyroidism   Psychiatric/Behavioral:  Negative for suicidal ideas and hallucinations. Depression: stable. Nervous/anxious: Stable. Insomnia: Stable.     Blood pressure 135/89, pulse 135, temperature 98.4 F (36.9 C), temperature source Oral, resp. rate 20, height 5\' 8"  (1.727 m), weight 185.975 kg (410 lb), last menstrual period 10/03/2014.Body mass index is 62.35 kg/(m^2).  Have you used any form of tobacco in the last 30 days? (Cigarettes, Smokeless Tobacco, Cigars, and/or Pipes): No  Has this patient used any form of tobacco in the last 30 days? (Cigarettes, Smokeless Tobacco, Cigars, and/or Pipes) No  Past Medical History:  Past Medical History  Diagnosis Date  . Depression   . Hypertension   . Post traumatic stress disorder (PTSD)   . Diabetes mellitus without complication   . Hypothyroidism   . Morbid obesity     Past Surgical History  Procedure Laterality Date  . Tonsillectomy    . Wisdom tooth extraction     Family History:  Family History  Problem Relation Age of Onset  . Bipolar disorder Father   . Bipolar disorder Mother    Social History:  History  Alcohol Use No     History  Drug Use No    Social History   Social History  . Marital Status: Single    Spouse Name: N/A  . Number of Children: N/A  . Years of Education: N/A   Social History Main Topics  . Smoking status: Never Smoker   . Smokeless tobacco: None  . Alcohol Use: No  . Drug Use: No  . Sexual Activity: Yes    Birth Control/ Protection:  None   Other Topics Concern  . None   Social History Narrative   Risk to Self: Is patient at risk for suicide?: No Risk to Others:   Prior Inpatient Therapy:   Prior Outpatient Therapy:    Level of Care:  OP  Hospital Course:  Renee Steele was admitted for Bipolar 1 disorder, depressed, severe and crisis management.  She was treated discharged with the medications listed below under Medication List.  Medical problems were identified and treated as needed.  Home medications were restarted  as appropriate.  Improvement was monitored by observation and Renee Steele daily report of symptom reduction.  Emotional and mental status was monitored by daily self-inventory reports completed by Renee Steele and clinical staff.         Renee Steele was evaluated by the treatment team for stability and plans for continued recovery upon discharge.  Renee Steele motivation was an integral factor for scheduling further treatment.  Employment, transportation, bed availability, health status, family support, and any pending legal issues were also considered during her hospital stay.  She was offered further treatment options upon discharge including but not limited to Residential, Intensive Outpatient, and Outpatient treatment.  Renee Steele will follow up with the services as listed below under Follow Up Information.     Upon completion of this admission the patient was both mentally and medically stable for discharge denying suicidal/homicidal ideation, auditory/visual/tactile hallucinations, delusional thoughts and paranoia.      Consults:  psychiatry  Significant Diagnostic Studies:  labs: TSH, Urine culture, Urinalysis, CBC/Diff, Pregnancy, MMP, ETOH, UDS  Discharge Vitals:   Blood pressure 135/89, pulse 135, temperature 98.4 F (36.9 C), temperature source Oral, resp. rate 20, height 5\' 8"  (1.727 m), weight 185.975 kg (410 lb), last menstrual period 10/03/2014. Body mass index is 62.35 kg/(m^2). Lab Results:   Results for orders placed or performed during the hospital encounter of 02/23/15 (from the past 72 hour(s))  Urine culture     Status: None (Preliminary result)   Collection Time: 02/26/15  1:10 PM  Result Value Ref Range   Specimen Description      URINE, RANDOM Performed at [facility]      Normal Performed at [facility] Performed at [facility]     Report Status PENDING   TSH     Status: None   Collection Time: 02/26/15  7:44 PM  Result Value Ref Range   TSH 1.019 0.350 - 4.500 uIU/mL    Comment: Performed at [facility]    Physical Findings: AIMS: Facial and Oral Movements Muscles of Facial Expression: None, normal Lips and Perioral Area: None, normal Jaw: None, normal Tongue: None, normal,Extremity Movements Upper (arms, wrists, hands, fingers): None, normal Lower (legs, knees, ankles, toes): None, normal, Trunk Movements Neck, shoulders, hips: None, normal, Overall Severity Severity of abnormal movements (highest score from questions above): None, normal Incapacitation due to abnormal movements: None, normal Patient's awareness of abnormal movements (rate only patient's report): No Awareness, Dental Status Current problems with teeth and/or dentures?: No Does patient usually wear dentures?: No  CIWA:  CIWA-Ar Total: 2 COWS:  COWS Total Score: 4   See Psychiatric Specialty Exam and Suicide Risk Assessment completed by Attending Physician prior to discharge.  Discharge destination:  Home  Is patient on multiple antipsychotic therapies at discharge:  No  Has Patient had three or more failed trials of antipsychotic monotherapy by history:  No    Recommended Plan for Multiple Antipsychotic Therapies: NA      Discharge Instructions    Activity as tolerated - No restrictions    Complete by:  As directed      Diet general    Complete by:  As directed      Discharge instructions    Complete by:  As directed   Take all of you medications as prescribed by your mental healthcare provider.  Report any adverse effects and reactions from your medications to your outpatient provider promptly. Do not engage in alcohol and or illegal drug use while on prescription medicines. In the event of worsening symptoms call the crisis hotline, 911, and or go to the nearest emergency department for appropriate  evaluation and treatment of symptoms. Follow-up with your primary care provider for your medical issues, concerns and or health care needs.   Keep all scheduled appointments.  If you are unable to keep an appointment call to reschedule.  Let the nurse know if you will need medications before next scheduled appointment.            Medication List    STOP taking these medications        escitalopram 20 MG tablet  Commonly known as:  LEXAPRO      TAKE these medications      Indication   amLODipine 10 MG tablet  Commonly known as:  NORVASC  Take 1 tablet (10 mg total) by mouth daily.   Indication:  High Blood Pressure     buPROPion 300 MG 24 hr tablet  Commonly known as:  WELLBUTRIN XL  Take 1 tablet (300 mg total) by mouth daily.   Indication:  Major Depressive Disorder     hydrOXYzine 25 MG tablet  Commonly known as:  ATARAX/VISTARIL  Take 1 tablet (25 mg total) by mouth 3 (three) times daily as needed for anxiety (sleep).   Indication:  Anxiety Neurosis, anxiety/sleep     lamoTRIgine 25 MG tablet  Commonly known as:  LAMICTAL  Take 1 tablet (25 mg total) by mouth daily.   Indication:  Mood control     levothyroxine 50 MCG tablet  Commonly known as:  SYNTHROID, LEVOTHROID  Take 1 tablet (50 mcg total) by mouth daily before breakfast.   Indication:  Underactive Thyroid     lisinopril 40 MG tablet  Commonly known as:  PRINIVIL,ZESTRIL  Take 1 tablet (40 mg total) by mouth daily.   Indication:  High Blood Pressure     loratadine 10 MG tablet  Commonly known as:  CLARITIN  Take 1 tablet (10 mg total) by mouth daily.   Indication:  Hayfever     Lurasidone HCl 60 MG Tabs  Take 60 mg by mouth daily with supper.   Indication:  Mood control     traZODone 100 MG tablet  Commonly known as:  DESYREL  Take 1 tablet (100 mg total) by mouth at bedtime as needed for sleep.   Indication:  Trouble Sleeping       Follow-up Information    Follow up with [facility]habil[facility].   Why:  Call Vocational Rehabilitation after discharge to set up appt for follow-up assessment.    Contact information:   [address] [place], [place] [address] Phone: [phone] Fax: [phone]      Follow up with [facility]  On 03/05/2015.  Why:  Appt on this date on 2:30PM with Wyatt Haste. Please arrive about 15 minutes to complete new patient  paperwork.    Contact information:   [address] [place], [place] [address] Phone: [phone] Fax: [phone]      Follow up with [facility].   Why:  Walk in between 8:30AM-3:00PM for assessment (for medication management/mental health services)   Contact information:   [address], [place] [address] Phone: [phone] Fax: [phone]      Follow up with Department of Social Services-Medicaid.   Why:  Please contact your medicaid case worker at discharge to schedule follow-up visit.    Contact information:   [place], [place] [address] Phone: [phone] Fax: [phone]      Follow-up recommendations:  Activity:  As tolerated Diet:  Low Sodium  Comments:   Patient has been instructed to take medications as prescribed; and report adverse effects to outpatient provider.  Follow up with primary doctor for any medical issues and If symptoms recur report to nearest emergency or crisis hot line.    Total Discharge Time: 45 minutes  Signed: Earleen Newport, FNP-BC 02/28/2015, 10:14 AM  I personally assessed the patient and formulated the plan Geralyn Flash A. Sabra Heck, M.D.

## 2015-02-28 NOTE — B[facility] Group Notes (Signed)
[facility] Group Notes:  (Nursing/MHT/Case Management/Adjunct)  Date:  02/28/2015  Time:  0945 Type of Therapy:  Nurse Education  Participation Level:  Did Not Attend     Mart Piggs 02/28/2015, 12:04 PM

## 2015-02-28 NOTE — [facility] Suicide Risk Assessment (Signed)
[facility] Discharge Suicide Risk Assessment   Demographic Factors:  NA  Total Time spent with patient: 30 minutes  Musculoskeletal: Strength & Muscle Tone: within normal limits Gait & Station: normal Patient leans: normal  Psychiatric Specialty Exam: Physical Exam  ROS  Blood pressure 135/89, pulse 135, temperature 98.4 F (36.9 C), temperature source Oral, resp. rate 20, height 5\' 8"  (1.727 m), weight 185.975 kg (410 lb), last menstrual period 10/03/2014.Body mass index is 62.35 kg/(m^2).  General Appearance: Fairly Groomed  Engineer, water::  Fair  Speech:  Clear and [id number]  Volume:  Normal  Mood:  Euthymic  Affect:  Appropriate  Thought Process:  Coherent and Goal Directed  Orientation:  Full (Time, Place, and Person)  Thought Content:  plans as she moves on  Suicidal Thoughts:  No  Homicidal Thoughts:  No  Memory:  Immediate;   Fair Recent;   Fair Remote;   Fair  Judgement:  Fair  Insight:  Present  Psychomotor Activity:  Normal  Concentration:  Fair  Recall:  [employer] [place]  Language: Fair  Akathisia:  No  Handed:  Right  AIMS (if indicated):     Assets:  Desire for Improvement Housing Social Support  Sleep:  Number of Hours: 5.25  Cognition: WNL  ADL's:  Intact   Have you used any form of tobacco in the last 30 days? (Cigarettes, Smokeless Tobacco, Cigars, and/or Pipes): No  Has this patient used any form of tobacco in the last 30 days? (Cigarettes, Smokeless Tobacco, Cigars, and/or Pipes) No  Mental Status Per Nursing Assessment::   On Admission:  NA  Current Mental Status by Physician: In full contact with reality. There are no active SI plans or intent. She is going back to stay with her friend. she states he feels ready to go home. She is mindful that she needs to stay on her medications   Loss Factors: NA  Historical Factors: NA  Risk Reduction Factors:   Living with another person, especially a relative and Positive social  support  Continued Clinical Symptoms:  Bipolar Disorder:   Depressive phase  Cognitive Features That Contribute To Risk:  Closed-mindedness, Polarized thinking and Thought constriction (tunnel vision)    Suicide Risk:  Minimal: No identifiable suicidal ideation.  Patients presenting with no risk factors but with morbid ruminations; may be classified as minimal risk based on the severity of the depressive symptoms  Principal Problem: Bipolar 1 disorder, depressed, severe Discharge Diagnoses:  Patient Active Problem List   Diagnosis Date Noted  . Morbid obesity [E66.01] 02/24/2015  . Hypothyroidism [E03.9] 02/24/2015  . PCOS (polycystic ovarian syndrome) [E28.2] 02/24/2015  . Diabetes [E11.9] 02/24/2015  . Bipolar 1 disorder, depressed, severe [F31.4] 11/24/2014    Follow-up Information    Follow up with [facility].   Why:  Call Vocational Rehabilitation after discharge to set up appt for follow-up assessment.    Contact information:   [address] [place], [place] [address] Phone: [phone] Fax: [phone]      Follow up with [facility]  On 03/05/2015.   Why:  Appt on this date on 2:30PM with Wyatt Haste. Please arrive about 15 minutes to complete new patient  paperwork.    Contact information:   [address] [place], [place] [address] Phone: [phone] Fax: [phone]      Follow up with [facility].   Why:  Walk in between 8:30AM-3:00PM for assessment (for medication management/mental health services)   Contact information:  [address], [place] [address] Phone: [phone] Fax: [phone]      Follow up with Department of Social Services-Medicaid.   Why:  Please contact your medicaid case worker at discharge to schedule follow-up visit.    Contact information:   [place], [place] [address] Phone: [phone] Fax: [phone]      Plan Of Care/Follow-up recommendations:  Activity:  as tolerated Diet:   regular Follow up as above Is patient on multiple antipsychotic therapies at discharge:  No   Has Patient had three or more failed trials of antipsychotic monotherapy by history:  No  Recommended Plan for Multiple Antipsychotic Therapies: NA    Kendra Grissett A 02/28/2015, 12:21 PM

## 2015-02-28 NOTE — Progress Notes (Signed)
  [facility] Adult Case Management Discharge Plan :  Will you be returning to the same living situation after discharge:  Yes,  home with friend At discharge, do you have transportation home?: Yes,  part bus and bus passes Do you have the ability to pay for your medications: Yes,  mental health  Release of information consent forms completed and submitted to medical records by CSW.  Patient to Follow up at: Follow-up Information    Follow up with [facility].   Why:  Call Vocational Rehabilitation after discharge to set up appt for follow-up assessment.    Contact information:   [address] [place], [place] [address] Phone: [phone] Fax: [phone]      Follow up with [facility]  On 03/05/2015.   Why:  Appt on this date on 2:30PM with Wyatt Haste. Please arrive about 15 minutes to complete new patient  paperwork.    Contact information:   [address] [place], [place] [address] Phone: [phone] Fax: [phone]      Follow up with [facility].   Why:  Walk in between 8:30AM-3:00PM for assessment (for medication management/mental health services)   Contact information:   [address], [place] [address] Phone: [phone] Fax: [phone]      Follow up with Department of Social Services-Medicaid.   Why:  Please contact your medicaid case worker at discharge to schedule follow-up visit.    Contact information:   [place],  [address] Phone: [phone] Fax: [phone]      Patient denies SI/HI: Yes,  during group/self report.    Safety Planning and Suicide Prevention discussed: Yes,  SPE completed with pt, as she refused to consent to family contact.  Have you used any form of tobacco in the last 30 days? (Cigarettes, Smokeless Tobacco, Cigars, and/or Pipes): No  Has patient been referred to the Quitline?: Patient is not a tobacco user.   Smart, Rishon Thilges LCSWA 02/28/2015, 10:09 AM

## 2015-07-04 ENCOUNTER — Ambulatory Visit: Payer: Self-pay | Admitting: Family Medicine

## 2016-06-01 ENCOUNTER — Emergency Department (HOSPITAL_BASED_OUTPATIENT_CLINIC_OR_DEPARTMENT_OTHER)
Admission: EM | Admit: 2016-06-01 | Discharge: 2016-06-02 | Disposition: A | Discharge status: Home / Self Care | Payer: Medicaid Other | Attending: Emergency Medicine | Admitting: Emergency Medicine

## 2016-06-01 ENCOUNTER — Emergency Department (HOSPITAL_BASED_OUTPATIENT_CLINIC_OR_DEPARTMENT_OTHER): Payer: Medicaid Other

## 2016-06-01 ENCOUNTER — Encounter (HOSPITAL_BASED_OUTPATIENT_CLINIC_OR_DEPARTMENT_OTHER): Payer: Self-pay | Admitting: Respiratory Therapy

## 2016-06-01 DIAGNOSIS — K5900 Constipation, unspecified: Secondary | ICD-10-CM

## 2016-06-01 DIAGNOSIS — I10 Essential (primary) hypertension: Secondary | ICD-10-CM | POA: Insufficient documentation

## 2016-06-01 DIAGNOSIS — Z79899 Other long term (current) drug therapy: Secondary | ICD-10-CM | POA: Diagnosis not present

## 2016-06-01 DIAGNOSIS — N760 Acute vaginitis: Secondary | ICD-10-CM | POA: Diagnosis not present

## 2016-06-01 DIAGNOSIS — E119 Type 2 diabetes mellitus without complications: Secondary | ICD-10-CM | POA: Insufficient documentation

## 2016-06-01 DIAGNOSIS — R0789 Other chest pain: Secondary | ICD-10-CM | POA: Diagnosis not present

## 2016-06-01 DIAGNOSIS — R05 Cough: Secondary | ICD-10-CM | POA: Diagnosis not present

## 2016-06-01 DIAGNOSIS — Z7984 Long term (current) use of oral hypoglycemic drugs: Secondary | ICD-10-CM | POA: Insufficient documentation

## 2016-06-01 DIAGNOSIS — R0602 Shortness of breath: Secondary | ICD-10-CM | POA: Diagnosis present

## 2016-06-01 DIAGNOSIS — B9689 Other specified bacterial agents as the cause of diseases classified elsewhere: Secondary | ICD-10-CM

## 2016-06-01 DIAGNOSIS — R053 Chronic cough: Secondary | ICD-10-CM

## 2016-06-01 LAB — URINE MICROSCOPIC-ADD ON

## 2016-06-01 LAB — URINALYSIS, ROUTINE W REFLEX MICROSCOPIC
Bilirubin Urine: NEGATIVE
GLUCOSE, UA: NEGATIVE mg/dL
HGB URINE DIPSTICK: NEGATIVE
Ketones, ur: NEGATIVE mg/dL
Nitrite: NEGATIVE
PH: 5.5 (ref 5.0–8.0)
Protein, ur: NEGATIVE mg/dL
Specific Gravity, Urine: 1.014 (ref 1.005–1.030)

## 2016-06-01 LAB — PREGNANCY, URINE: Preg Test, Ur: NEGATIVE

## 2016-06-01 NOTE — ED Triage Notes (Addendum)
Pt c/o cough and SOB x 1 week also c/o increased lower leg swelling , and non compliant with BP meds. Pt is living in a homeless  shelter

## 2016-06-01 NOTE — ED Notes (Signed)
Pt c/o cough for six weeks, SOB for the last week, vaginal odor and itching, intermittent headaches, lower back pain, upper back pain and she has not been taking her BP medication just because she has not felt like it.  Pt in no acute distress, able to speak in full sentences and walk to restroom and back without becoming short of breath.

## 2016-06-01 NOTE — ED Provider Notes (Signed)
By signing my name below, I, [name], attest that this documentation has been prepared under the direction and in the presence of [place], DO . Electronically Signed: [name], ED Scribe. 06/01/2016. 11:31 PM.   TIME SEEN: 11:03 PM   CHIEF COMPLAINT:  Chief Complaint  Patient presents with  . Shortness of Breath     HPI:  HPI Comments:  Renee Steele is a 35 y.o. female with PMHx of Hypertension, diabetes, hypothyroidism, morbid obesity who presents to the Emergency Department complaining of multiple complaints.   First patient is complaining of constant cough for 6 weeks. It is mostly a dry cough. She has tried albuterol inhaler and Tessalon Perles previously. Tessalon Perles have helped her in the past. No wheezing. No history of asthma or COPD. States that the cough keeps her up at night. No hemoptysis. Denies fever.   Patient also complains of SOB x 2 weeks. Worse with exertion. Reports she has had increased bilateral lower extremity swelling. No history of PE or DVT.   Also complains of several seconds of sharp left-sided chest pain. Last episode was 5 days ago. Pain is worse with moving around. No chest pain currently.   Patient also complaining of constipation. Is still having intermittent bowel movements and passing gas. No abdominal pain, nausea or vomiting.  Has not tried anything at home for her constipation.   Also complaining of white vaginal discharge, itching and burning around her vaginal region for the past week. Sexually active with one partner and uses protection. Has had Chlamydia before which was treated. Also has history of HPV. No history of previous pregnancies. Has a history of PCO S reports irregular menstrual cycles. Last menstrual period was in July 2017. No vaginal bleeding.   Patient states she has not been taking her blood pressure medication for the past week. States she does have plenty of her medications but just hasn't  felt like taking the medicine.   She currently lives in a homeless shelter, plans to move to [place] next week.    ROS: See HPI Constitutional: no fever  Eyes: no drainage  ENT: no runny nose   Cardiovascular:   chest pain  Resp: Positive for SOB, cough, and chest pain GI: Positive for constipation GU: Positive for vaginal discharge.  Integumentary: no rash  Allergy: no hives  Musculoskeletal: no leg swelling  Neurological: no slurred speech ROS otherwise negative  PAST MEDICAL HISTORY/PAST SURGICAL HISTORY:  Past Medical History:  Diagnosis Date  . Depression   . Diabetes mellitus without complication ([place])   . Hypertension   . Hypothyroidism   . Morbid obesity ([place])   . Post traumatic stress disorder (PTSD)     MEDICATIONS:  Prior to Admission medications   Medication Sig Start Date End Date Taking? Authorizing Provider  amLODipine (NORVASC) 10 MG tablet Take 1 tablet (10 mg total) by mouth daily. 11/28/14   Kerrie Buffalo, NP  buPROPion (WELLBUTRIN XL) 300 MG 24 hr tablet Take 1 tablet (300 mg total) by mouth daily. 02/28/15   Shuvon B Rankin, NP  cephALEXin (KEFLEX) 500 MG capsule Take 1 capsule (500 mg total) by mouth every 12 (twelve) hours. 02/28/15   Kerrie Buffalo, NP  hydrOXYzine (ATARAX/VISTARIL) 25 MG tablet Take 1 tablet (25 mg total) by mouth 3 (three) times daily as needed for anxiety (sleep). 02/28/15   Shuvon B Rankin, NP  lamoTRIgine (LAMICTAL) 25 MG tablet Take 1 tablet (25 mg total) by mouth daily. 02/28/15  Shuvon B Rankin, NP  levothyroxine (SYNTHROID, LEVOTHROID) 50 MCG tablet Take 1 tablet (50 mcg total) by mouth daily before breakfast. 11/28/14   Kerrie Buffalo, NP  lisinopril (PRINIVIL,ZESTRIL) 40 MG tablet Take 1 tablet (40 mg total) by mouth daily. 11/28/14   Kerrie Buffalo, NP  lisinopril (PRINIVIL,ZESTRIL) 40 MG tablet Take 1 tablet (40 mg total) by mouth daily. 03/01/15   Kerrie Buffalo, NP  loratadine (CLARITIN) 10 MG tablet Take 1 tablet (10 mg  total) by mouth daily. 02/28/15   Shuvon B Rankin, NP  lurasidone 60 MG TABS Take 60 mg by mouth daily with supper. 02/28/15   Shuvon B Rankin, NP  traZODone (DESYREL) 100 MG tablet Take 1 tablet (100 mg total) by mouth at bedtime as needed for sleep. 02/28/15   Shuvon B Rankin, NP    ALLERGIES:  No Known Allergies  SOCIAL HISTORY:  Social History  Substance Use Topics  . Smoking status: Never Smoker  . Smokeless tobacco: Never Used  . Alcohol use No    FAMILY HISTORY: Family History  Problem Relation Age of Onset  . Bipolar disorder Father   . Bipolar disorder Mother     EXAM: BP 154/98   Pulse 76   Temp 98.1 F (36.7 C)   Resp 16   Ht 5\' 8"  (1.727 m)   Wt (!) 410 lb (186 kg)   SpO2 100%   BMI 62.34 kg/m  CONSTITUTIONAL: Alert and oriented and responds appropriately to questions. Well-appearing; Morbidly obese. Afebrile, nontoxic, no distress. HEAD: Normocephalic EYES: Conjunctivae clear, PERRL, EOMI ENT: normal nose; no rhinorrhea; moist mucous membranes NECK: Supple, no meningismus, no nuchal rigidity, no LAD  CARD: RRR; S1 and S2 appreciated; no murmurs, no clicks, no rubs, no gallops RESP: Normal chest excursion without splinting or tachypnea; breath sounds clear and equal bilaterally; no wheezes, no rhonchi, no rales, no hypoxia or respiratory distress, speaking full sentences ABD/GI: Normal bowel sounds; non-distended; soft, non-tender, no rebound, no guarding, no peritoneal signs, no hepatosplenomegaly GU:  Normal external genitalia. No lesions, rashes noted. Patient has no vaginal bleeding on exam. Moderate amount of thick white vaginal discharge.  No adnexal tenderness, mass or fullness, no cervical motion tenderness. Cervix is not appear friable.  Cervix is closed. Cervix is slightly erythematous around the os with 2 small papular lesions.  Chaperone present for exam. BACK:  The back appears normal and Is non-tender to palpation, there is no CVA tenderness EXT:  Normal ROM in all joints; non-tender to palpation; no edema; normal capillary refill; no cyanosis, no calf tenderness or swelling    SKIN: Normal color for age and race; warm; no rash NEURO: Moves all extremities equally, sensation to light touch intact diffusely, cranial nerves II through XII intact, normal speech, normal gait PSYCH: The patient's mood and manner are appropriate. Grooming and personal hygiene are appropriate.  MEDICAL DECISION MAKING: Patient here with multiple different complaints. Her exam is unremarkable. We'll obtain labs, chest x-ray, urine, pelvic exam with cultures. Doubt ACS, PE, dissection. No chest pain currently. No hypoxia and her lungs are clear. Abdomen soft and nontender. No nausea or vomiting. Nondistended abdomen. Doubt bowel obstruction. No significant swelling noted in either lower extremity. tenderness. Doubt PE, DVT.  ED PROGRESS: Patient's workup has been unremarkable. No leukocytosis, mildly anemic which is stable. Electrolytes, creatinine normal. Troponin negative. BNP is 10. Wet prep is positive for clue cells and we will treat her for bacterial vaginosis with Flagyl. Urine does show white blood  cells and bacteria she denies dysuria, hematuria, or frequency or urgency. Culture is pending. I do not feel she needs to be on antibiotics that she is not having symptoms of a urinary tract infection at this time. Her chest x-ray is clear and shows no effusion, edema, pneumothorax, infiltrate. There is borderline cardiomegaly. EKG shows no ischemia, arrhythmia, interval abnormality.  She now reports that she thinks that her chronic cough may be from acid reflux and she does have heartburn occasionally. We'll discharge with Flagyl for her bacterial vaginosis, omeprazole for her GERD which may be contributing to her chronic cough, Tessalon Perles for her cough, MiraLAX and Colace for her constipation. I do not feel she needs further antibiotics. I feel she is safe for  discharge.   I have discussed with her the abnormal appearance of her cervix and recommended close OB/GYN follow-up. Have given her these outpatient recommendations. She denies history of culdocentesis, colposcopy. Does have history of HPV.  I have also provided her with outpatient PCP follow-up. She is requesting a work note. Discussed at length return precautions.    At this time, I do not feel there is any life-threatening condition present. I have reviewed and discussed all results (EKG, imaging, lab, urine as appropriate) and exam findings with patient/family. I have reviewed nursing notes and appropriate previous records.  I feel the patient is safe to be discharged home without further emergent workup and can continue workup as an outpatient as needed. Discussed usual and customary return precautions. Patient/family verbalize understanding and are comfortable with this plan.  Outpatient follow-up has been provided. All questions have been answered.    EKG Interpretation  Date/Time:  Monday June 01 2016 22:30:03 EST Ventricular Rate:  79 PR Interval:    QRS Duration: 88 QT Interval:  395 QTC Calculation: 453 R Axis:   74 Text Interpretation:  Sinus rhythm No significant change since last tracing Confirmed by Razia Screws,  DO, Arseniy Toomey [id number]) on 06/01/2016 11:04:58 PM        I personally performed the services described in this documentation, which was scribed in my presence. The recorded information has been reviewed and is accurate.     [place], DO 06/02/16 [phone]

## 2016-06-02 LAB — COMPREHENSIVE METABOLIC PANEL
ALK PHOS: 56 U/L (ref 38–126)
ALT: 18 U/L (ref 14–54)
ANION GAP: 9 (ref 5–15)
AST: 19 U/L (ref 15–41)
Albumin: 3.8 g/dL (ref 3.5–5.0)
BILIRUBIN TOTAL: 0.6 mg/dL (ref 0.3–1.2)
BUN: 14 mg/dL (ref 6–20)
CALCIUM: 9.2 mg/dL (ref 8.9–10.3)
CO2: 25 mmol/L (ref 22–32)
Chloride: 104 mmol/L (ref 101–111)
Creatinine, Ser: 0.82 mg/dL (ref 0.44–1.00)
GFR calc non Af Amer: >60 mL/min (ref >60)
GLUCOSE: 105 mg/dL — AB (ref 65–99)
Potassium: 3.5 mmol/L (ref 3.5–5.1)
Sodium: 138 mmol/L (ref 135–145)
TOTAL PROTEIN: 7 g/dL (ref 6.5–8.1)

## 2016-06-02 LAB — CBC WITH DIFFERENTIAL/PLATELET
Basophils Absolute: 0 10*3/uL (ref 0.0–0.1)
Basophils Relative: 1 %
Eosinophils Absolute: 0.1 10*3/uL (ref 0.0–0.7)
Eosinophils Relative: 1 %
HEMATOCRIT: 35.5 % — AB (ref 36.0–46.0)
HEMOGLOBIN: 11.3 g/dL — AB (ref 12.0–15.0)
LYMPHS ABS: 2.4 10*3/uL (ref 0.7–4.0)
Lymphocytes Relative: 32 %
MCH: 25.7 pg — AB (ref 26.0–34.0)
MCHC: 31.8 g/dL (ref 30.0–36.0)
MCV: 80.9 fL (ref 78.0–100.0)
MONOS PCT: 9 %
Monocytes Absolute: 0.7 10*3/uL (ref 0.1–1.0)
NEUTROS ABS: 4.4 10*3/uL (ref 1.7–7.7)
NEUTROS PCT: 57 %
Platelets: 359 10*3/uL (ref 150–400)
RBC: 4.39 MIL/uL (ref 3.87–5.11)
RDW: 12.9 % (ref 11.5–15.5)
WBC: 7.6 10*3/uL (ref 4.0–10.5)

## 2016-06-02 LAB — TROPONIN I

## 2016-06-02 LAB — WET PREP, GENITAL
Sperm: NONE SEEN
TRICH WET PREP: NONE SEEN
Yeast Wet Prep HPF POC: NONE SEEN

## 2016-06-02 LAB — BRAIN NATRIURETIC PEPTIDE: B Natriuretic Peptide: 10 pg/mL (ref 0.0–100.0)

## 2016-06-02 MED ORDER — METRONIDAZOLE 500 MG PO TABS
500.0000 mg | ORAL_TABLET | Freq: Once | ORAL | Status: AC
  Administered 2016-06-02: 500 mg via ORAL
  Filled 2016-06-02: qty 1

## 2016-06-02 MED ORDER — POLYETHYLENE GLYCOL 3350 17 G PO PACK: 17.0000 g | PACK | Freq: Every day | ORAL | 1 refills | Status: DC

## 2016-06-02 MED ORDER — PANTOPRAZOLE SODIUM 40 MG PO TBEC
40.0000 mg | DELAYED_RELEASE_TABLET | Freq: Once | ORAL | Status: AC
  Administered 2016-06-02: 40 mg via ORAL
  Filled 2016-06-02: qty 1

## 2016-06-02 MED ORDER — BENZONATATE 100 MG PO CAPS
100.0000 mg | ORAL_CAPSULE | Freq: Once | ORAL | Status: AC
  Administered 2016-06-02: 100 mg via ORAL
  Filled 2016-06-02: qty 1

## 2016-06-02 MED ORDER — BENZONATATE 100 MG PO CAPS: 100.0000 mg | ORAL_CAPSULE | Freq: Three times a day (TID) | ORAL | 0 refills | Status: DC | PRN

## 2016-06-02 MED ORDER — OMEPRAZOLE 40 MG PO CPDR: 40.0000 mg | DELAYED_RELEASE_CAPSULE | Freq: Every day | ORAL | 1 refills | Status: DC

## 2016-06-02 MED ORDER — METRONIDAZOLE 500 MG PO TABS: 500.0000 mg | ORAL_TABLET | Freq: Two times a day (BID) | ORAL | 0 refills | Status: DC

## 2016-06-02 MED ORDER — DOCUSATE SODIUM 100 MG PO CAPS: 100.0000 mg | ORAL_CAPSULE | Freq: Two times a day (BID) | ORAL | 1 refills | Status: DC

## 2016-06-02 NOTE — Discharge Instructions (Signed)
To find a primary care or specialty doctor please call [phone] or [phone] to access "[facility]."  You may also go on the [facility] website at [email or web address]  There are also multiple [facility], [facility], [facility] and [facility] practices throughout the [place] that are frequently accepting new patients. You may find a clinic that is close to your home and contact them.  [facility] [phone] [place]  [place] [address] [phone]   [email or web address] [place] [address] [place], [place] [phone]    [email or web address] [address] [place], [place] ([place]) [place] # [place], [place] [phone]   [facility] [email or web address] [address] [place], [place] [phone]   [facility] [email or web address] [address] [place], [place] [phone]   [facility] [email or web address] [phone]

## 2016-06-02 NOTE — ED Notes (Signed)
Pt verbalizes understanding of d/c instructions and denies any further needs at this time. 

## 2016-06-03 LAB — GC/CHLAMYDIA PROBE AMP (~~LOC~~) NOT AT ARMC
Chlamydia: NEGATIVE
Neisseria Gonorrhea: NEGATIVE

## 2016-06-03 LAB — RPR: RPR: NONREACTIVE

## 2016-06-03 LAB — URINE CULTURE

## 2016-06-03 LAB — HIV ANTIBODY (ROUTINE TESTING W REFLEX): HIV Screen 4th Generation wRfx: NONREACTIVE

## 2016-06-06 ENCOUNTER — Encounter (HOSPITAL_BASED_OUTPATIENT_CLINIC_OR_DEPARTMENT_OTHER): Payer: Self-pay | Admitting: *Deleted

## 2016-06-06 ENCOUNTER — Emergency Department (HOSPITAL_BASED_OUTPATIENT_CLINIC_OR_DEPARTMENT_OTHER)
Admission: EM | Admit: 2016-06-06 | Discharge: 2016-06-07 | Disposition: A | Discharge status: Home / Self Care | Payer: Medicaid Other | Attending: Emergency Medicine | Admitting: Emergency Medicine

## 2016-06-06 DIAGNOSIS — Z79899 Other long term (current) drug therapy: Secondary | ICD-10-CM | POA: Diagnosis not present

## 2016-06-06 DIAGNOSIS — R059 Cough, unspecified: Secondary | ICD-10-CM

## 2016-06-06 DIAGNOSIS — E039 Hypothyroidism, unspecified: Secondary | ICD-10-CM | POA: Diagnosis not present

## 2016-06-06 DIAGNOSIS — R05 Cough: Secondary | ICD-10-CM

## 2016-06-06 DIAGNOSIS — R0602 Shortness of breath: Secondary | ICD-10-CM | POA: Diagnosis not present

## 2016-06-06 DIAGNOSIS — I10 Essential (primary) hypertension: Secondary | ICD-10-CM | POA: Diagnosis not present

## 2016-06-06 DIAGNOSIS — K5900 Constipation, unspecified: Secondary | ICD-10-CM | POA: Diagnosis not present

## 2016-06-06 DIAGNOSIS — E119 Type 2 diabetes mellitus without complications: Secondary | ICD-10-CM | POA: Diagnosis not present

## 2016-06-06 MED ORDER — DEXAMETHASONE 6 MG PO TABS
10.0000 mg | ORAL_TABLET | Freq: Once | ORAL | Status: AC
  Administered 2016-06-06: 10 mg via ORAL
  Filled 2016-06-06: qty 1

## 2016-06-06 MED ORDER — GUAIFENESIN ER 600 MG PO TB12
600.0000 mg | ORAL_TABLET | Freq: Once | ORAL | Status: DC
  Filled 2016-06-06: qty 1

## 2016-06-06 MED ORDER — ALBUTEROL SULFATE HFA 108 (90 BASE) MCG/ACT IN AERS
2.0000 | INHALATION_SPRAY | RESPIRATORY_TRACT | Status: DC | PRN
  Administered 2016-06-06: 2 via RESPIRATORY_TRACT
  Filled 2016-06-06: qty 6.7

## 2016-06-06 MED ORDER — IPRATROPIUM-ALBUTEROL 0.5-2.5 (3) MG/3ML IN SOLN
3.0000 mL | Freq: Once | RESPIRATORY_TRACT | Status: AC
  Administered 2016-06-06: 3 mL via RESPIRATORY_TRACT
  Filled 2016-06-06: qty 3

## 2016-06-06 NOTE — ED Triage Notes (Signed)
Pt reports cough.  States that she was seen here and treated last week for same. Cough noted in triage.

## 2016-06-06 NOTE — ED Notes (Signed)
ED Provider at bedside. 

## 2016-06-06 NOTE — ED Provider Notes (Signed)
[facility] Provider Note   CSN: [id number] Arrival date & time: 06/06/16  2144  By signing my name below, I, Renee Steele, attest that this documentation has been prepared under the direction and in the presence of Renee Reichert, MD . Electronically Signed: Neta Steele, ED Scribe. 06/06/2016. 10:31 PM.   History   Chief Complaint Chief Complaint  Patient presents with  . Cough    The history is provided by the patient. No language interpreter was used.   HPI Comments:  Renee Steele is a 35 y.o. female with PMHx of HTN who presents to the Emergency Department complaining of a worsening cough x 6-7 weeks. Pt states that her cough is productive with thick sputum. Pt complains of associated SOB and constipation. Pt was seen at the ED last week ago for the same and was treated.  Pt has been taking zyrtec and Prilosec with mild, temporary relief. Pt is not a smoker. Pt denies abdominal pain, nausea, vomiting, diarrhea. She does not take any hormones and has no history of DVT or PE.  Past Medical History:  Diagnosis Date  . Depression   . Diabetes mellitus without complication ([place])   . Hypertension   . Hypothyroidism   . Morbid obesity ([place])   . Post traumatic stress disorder (PTSD)     Patient Active Problem List   Diagnosis Date Noted  . Morbid obesity ([place]) 02/24/2015  . Hypothyroidism 02/24/2015  . PCOS (polycystic ovarian syndrome) 02/24/2015  . Diabetes ([place]) 02/24/2015  . Bipolar 1 disorder, depressed, severe ([place]) 11/24/2014    Past Surgical History:  Procedure Laterality Date  . TONSILLECTOMY    . WISDOM TOOTH EXTRACTION      OB History    No data available       Home Medications    Prior to Admission medications   Medication Sig Start Date End Date Taking? Authorizing Provider  amLODipine (NORVASC) 10 MG tablet Take 1 tablet (10 mg total) by mouth daily. 11/28/14   Kerrie Buffalo, NP  benzonatate (TESSALON) 100 MG capsule Take 1  capsule (100 mg total) by mouth 3 (three) times daily as needed for cough. 06/02/16   Kristen N Ward, DO  buPROPion (WELLBUTRIN XL) 300 MG 24 hr tablet Take 1 tablet (300 mg total) by mouth daily. 02/28/15   Shuvon B Rankin, NP  docusate sodium (COLACE) 100 MG capsule Take 1 capsule (100 mg total) by mouth every 12 (twelve) hours. 06/02/16   Kristen N Ward, DO  lamoTRIgine (LAMICTAL) 25 MG tablet Take 1 tablet (25 mg total) by mouth daily. 02/28/15   Shuvon B Rankin, NP  levothyroxine (SYNTHROID, LEVOTHROID) 50 MCG tablet Take 1 tablet (50 mcg total) by mouth daily before breakfast. 11/28/14   Kerrie Buffalo, NP  lisinopril (PRINIVIL,ZESTRIL) 40 MG tablet Take 1 tablet (40 mg total) by mouth daily. 11/28/14   Kerrie Buffalo, NP  lisinopril (PRINIVIL,ZESTRIL) 40 MG tablet Take 1 tablet (40 mg total) by mouth daily. 03/01/15   Kerrie Buffalo, NP  loratadine (CLARITIN) 10 MG tablet Take 1 tablet (10 mg total) by mouth daily. 02/28/15   Shuvon B Rankin, NP  lurasidone 60 MG TABS Take 60 mg by mouth daily with supper. 02/28/15   Shuvon B Rankin, NP  metroNIDAZOLE (FLAGYL) 500 MG tablet Take 1 tablet (500 mg total) by mouth 2 (two) times daily. 06/02/16   Kristen N Ward, DO  omeprazole (PRILOSEC) 40 MG capsule Take 1 capsule (40 mg total) by mouth  daily. 06/02/16   Delice Bison Ward, DO  polyethylene glycol (MIRALAX) packet Take 17 g by mouth daily. 06/02/16   Kristen N Ward, DO  traZODone (DESYREL) 100 MG tablet Take 1 tablet (100 mg total) by mouth at bedtime as needed for sleep. 02/28/15   Consuello Closs, NP    Family History Family History  Problem Relation Age of Onset  . Bipolar disorder Father   . Bipolar disorder Mother     Social History Social History  Substance Use Topics  . Smoking status: Never Smoker  . Smokeless tobacco: Never Used  . Alcohol use No     Allergies   Patient has no known allergies.   Review of Systems Review of Systems  Respiratory: Positive for cough and shortness  of breath.   Gastrointestinal: Positive for constipation. Negative for abdominal pain, diarrhea, nausea and vomiting.  All other systems reviewed and are negative.    Physical Exam Updated Vital Signs BP (!) 156/101 (BP Location: Right Arm)   Pulse 110   Temp 97.8 F (36.6 C) (Oral)   Resp 22   Wt (!) 417 lb 12.8 oz (189.5 kg)   SpO2 95%   BMI 63.53 kg/m   Physical Exam  Constitutional: She is oriented to person, place, and time. She appears well-developed and well-nourished.  Obese  HENT:  Head: Normocephalic and atraumatic.  Cardiovascular: Normal rate and regular rhythm.   No murmur heard. Pulmonary/Chest: Effort normal and breath sounds normal. No respiratory distress.  Decreased air movement in bilateral bases.   Abdominal: Soft. There is no tenderness. There is no rebound and no guarding.  Musculoskeletal: She exhibits no edema or tenderness.  Nonpitting edema in bilateral lower extremities.   Neurological: She is alert and oriented to person, place, and time.  Skin: Skin is warm and dry.  Psychiatric: She has a normal mood and affect. Her behavior is normal.  Nursing note and vitals reviewed.    ED Treatments / Results  DIAGNOSTIC STUDIES:  Oxygen Saturation is 97% on RA, normal by my interpretation.    COORDINATION OF CARE:  10:31 PM Discussed treatment plan with pt at bedside and pt agreed to plan.   Labs (all labs ordered are listed, but only abnormal results are displayed) Labs Reviewed - No data to display  EKG  EKG Interpretation None       Radiology No results found.  Procedures Procedures (including critical care time)  Medications Ordered in ED Medications  albuterol (PROVENTIL HFA;VENTOLIN HFA) 108 (90 Base) MCG/ACT inhaler 2 puff (2 puffs Inhalation Given 06/06/16 2324)  guaiFENesin (MUCINEX) 12 hr tablet 600 mg (600 mg Oral Not Given 06/06/16 2332)  ipratropium-albuterol (DUONEB) 0.5-2.5 (3) MG/3ML nebulizer solution 3 mL (3 mLs  Nebulization Given 06/06/16 2241)  dexamethasone (DECADRON) tablet 10 mg (10 mg Oral Given 06/06/16 2332)     Initial Impression / Assessment and Plan / ED Course  I have reviewed the triage vital signs and the nursing notes.  Pertinent labs & imaging results that were available during my care of the patient were reviewed by me and considered in my medical decision making (see chart for details).  Clinical Course     Patient here for evaluation of cough. She is in no distress in the emergency Department. On initial evaluation she had decreased air movement bilaterally without wheezes. Following treatment with nebulizer she has improvement in her symptoms with improved air movement bilaterally but still no wheezes. Current clinical picture is not  consistent with CHF, pneumonia, PE. Discussed home care for reactive airway/wheezing. Will provide a one-time dose of Decadron with albuterol MDI at home. Discussed the importance of outpatient follow-up as well as return precautions.  Final Clinical Impressions(s) / ED Diagnoses   Final diagnoses:  Cough    New Prescriptions New Prescriptions   No medications on file  I personally performed the services described in this documentation, which was scribed in my presence. The recorded information has been reviewed and is accurate.     Renee Reichert, MD 06/07/16 1350

## 2016-06-11 ENCOUNTER — Inpatient Hospital Stay: Payer: Self-pay

## 2016-06-11 ENCOUNTER — Ambulatory Visit (HOSPITAL_COMMUNITY)
Admission: EM | Admit: 2016-06-11 | Discharge: 2016-06-11 | Disposition: A | Discharge status: Home / Self Care | Payer: Medicaid Other | Attending: Emergency Medicine | Admitting: Emergency Medicine

## 2016-06-11 ENCOUNTER — Encounter (HOSPITAL_COMMUNITY): Payer: Self-pay | Admitting: Family Medicine

## 2016-06-11 DIAGNOSIS — I10 Essential (primary) hypertension: Secondary | ICD-10-CM

## 2016-06-11 DIAGNOSIS — E6609 Other obesity due to excess calories: Secondary | ICD-10-CM | POA: Diagnosis not present

## 2016-06-11 DIAGNOSIS — R05 Cough: Secondary | ICD-10-CM

## 2016-06-11 DIAGNOSIS — R059 Cough, unspecified: Secondary | ICD-10-CM

## 2016-06-11 NOTE — ED Triage Notes (Signed)
Pt her for cough that has been over a month. Sts she has been using Claritin and Prilosec. sts she also has received a breathing treatment and steroids also with no relief. sts from 2 ER visits.

## 2016-06-11 NOTE — ED Provider Notes (Signed)
CSN: [id number]     Arrival date & time 06/11/16  1633 History   First MD Initiated Contact with Patient 06/11/16 1714     Chief Complaint  Patient presents with  . Cough   (Consider location/radiation/quality/duration/timing/severity/associated sxs/prior Treatment) 35 year old morbidly obese female with a history of hypertension has had a cough for nearly 2 months. She has been to the emergency department twice has had an extensive evaluation and treated with bronchodilators, steroids, PPIs, and antihistamines for PND and still with no improvement. Sometimes the patient feels as though she is short of breath however this may or may not be due to body habitus. She presents today with no new symptoms. She denies shortness of breath. She is still using the HFA bronchodilator, taking the PPIs and combination antihistamines and she is continuing to take her medications which include lisinopril. She has no fever, no chills.      Past Medical History:  Diagnosis Date  . Depression   . Diabetes mellitus without complication ([place])   . Hypertension   . Hypothyroidism   . Morbid obesity ([place])   . Post traumatic stress disorder (PTSD)    Past Surgical History:  Procedure Laterality Date  . TONSILLECTOMY    . WISDOM TOOTH EXTRACTION     Family History  Problem Relation Age of Onset  . Bipolar disorder Father   . Bipolar disorder Mother    Social History  Substance Use Topics  . Smoking status: Never Smoker  . Smokeless tobacco: Never Used  . Alcohol use No   OB History    No data available     Review of Systems  Constitutional: Negative.   HENT: Negative.   Respiratory: Positive for cough. Negative for shortness of breath and wheezing.   Cardiovascular: Negative for chest pain.  Gastrointestinal: Positive for constipation.  Musculoskeletal: Negative.   Neurological: Negative.   All other systems reviewed and are negative.   Allergies  Patient has no known allergies.  Home  Medications   Prior to Admission medications   Medication Sig Start Date End Date Taking? Authorizing Provider  amLODipine (NORVASC) 10 MG tablet Take 1 tablet (10 mg total) by mouth daily. 11/28/14   Kerrie Buffalo, NP  benzonatate (TESSALON) 100 MG capsule Take 1 capsule (100 mg total) by mouth 3 (three) times daily as needed for cough. 06/02/16   Kristen N Ward, DO  buPROPion (WELLBUTRIN XL) 300 MG 24 hr tablet Take 1 tablet (300 mg total) by mouth daily. 02/28/15   Shuvon B Rankin, NP  docusate sodium (COLACE) 100 MG capsule Take 1 capsule (100 mg total) by mouth every 12 (twelve) hours. 06/02/16   Kristen N Ward, DO  lamoTRIgine (LAMICTAL) 25 MG tablet Take 1 tablet (25 mg total) by mouth daily. 02/28/15   Shuvon B Rankin, NP  levothyroxine (SYNTHROID, LEVOTHROID) 50 MCG tablet Take 1 tablet (50 mcg total) by mouth daily before breakfast. 11/28/14   Kerrie Buffalo, NP  loratadine (CLARITIN) 10 MG tablet Take 1 tablet (10 mg total) by mouth daily. 02/28/15   Shuvon B Rankin, NP  lurasidone 60 MG TABS Take 60 mg by mouth daily with supper. 02/28/15   Shuvon B Rankin, NP  metroNIDAZOLE (FLAGYL) 500 MG tablet Take 1 tablet (500 mg total) by mouth 2 (two) times daily. 06/02/16   Kristen N Ward, DO  omeprazole (PRILOSEC) 40 MG capsule Take 1 capsule (40 mg total) by mouth daily. 06/02/16   Kristen N Ward, DO  polyethylene glycol ([place])  packet Take 17 g by mouth daily. 06/02/16   Kristen N Ward, DO  traZODone (DESYREL) 100 MG tablet Take 1 tablet (100 mg total) by mouth at bedtime as needed for sleep. 02/28/15   Shuvon B Rankin, NP   Meds Ordered and Administered this Visit  Medications - No data to display  BP 173/73   Pulse 111   Temp 97.9 F (36.6 C)   Resp 18   SpO2 97%  No data found.   Physical Exam  Constitutional: She is oriented to person, place, and time. She appears well-developed and well-nourished. No distress.  HENT:  Head: Normocephalic and atraumatic.  Mouth/Throat:  Oropharynx is clear and moist.  Eyes: EOM are normal.  Neck: Normal range of motion. Neck supple.  Cardiovascular: Normal rate, regular rhythm and normal heart sounds.   Pulmonary/Chest: Effort normal and breath sounds normal. No respiratory distress. She has no wheezes. She has no rales.  Musculoskeletal: Normal range of motion. She exhibits no edema.  Neurological: She is alert and oriented to person, place, and time.  Skin: Skin is warm and dry.  Nursing note and vitals reviewed.   Urgent Care Course   Clinical Course     Procedures (including critical care time)  Labs Review Labs Reviewed - No data to display  Imaging Review No results found.   Visual Acuity Review  Right Eye Distance:   Left Eye Distance:   Bilateral Distance:    Right Eye Near:   Left Eye Near:    Bilateral Near:         MDM   1. Cough   2. Essential hypertension   3. Obesity due to excess calories with serious comorbidity, unspecified classification    Ace inhibitors such as lisinopril have a high incidence of cough as a side effect. You have been treated for most of the reasons for the common cough. Most of them have had little to no effect. The only remaining action I can see at this time is to stop. ACE inhibitor or lisinopril. Hopefully he will stop coughing with the next 3-5 days. Continue to look for or follow-up with a primary care provider.     Janne Napoleon, NP 06/11/16 1752    Janne Napoleon, NP 06/11/16 1753

## 2016-06-11 NOTE — Discharge Instructions (Addendum)
Ace inhibitors such as lisinopril have a high incidence of cough as a side effect. You have been treated for most of the reasons for the common cough. Most of them have had little to no effect. The only remaining action I can see at this time is to stop. ACE inhibitor or lisinopril. Hopefully he will stop coughing with the next 3-5 days. Continue to look for or follow-up with a primary care provider.

## 2016-08-01 ENCOUNTER — Ambulatory Visit (HOSPITAL_COMMUNITY)
Admission: EM | Admit: 2016-08-01 | Discharge: 2016-08-01 | Disposition: A | Discharge status: Home / Self Care | Payer: Medicaid Other | Attending: Family Medicine | Admitting: Family Medicine

## 2016-08-01 ENCOUNTER — Encounter (HOSPITAL_COMMUNITY): Payer: Self-pay | Admitting: *Deleted

## 2016-08-01 DIAGNOSIS — E119 Type 2 diabetes mellitus without complications: Secondary | ICD-10-CM | POA: Insufficient documentation

## 2016-08-01 DIAGNOSIS — Z818 Family history of other mental and behavioral disorders: Secondary | ICD-10-CM | POA: Diagnosis not present

## 2016-08-01 DIAGNOSIS — R350 Frequency of micturition: Secondary | ICD-10-CM | POA: Insufficient documentation

## 2016-08-01 DIAGNOSIS — Z9889 Other specified postprocedural states: Secondary | ICD-10-CM | POA: Diagnosis not present

## 2016-08-01 DIAGNOSIS — F319 Bipolar disorder, unspecified: Secondary | ICD-10-CM | POA: Diagnosis not present

## 2016-08-01 DIAGNOSIS — B9689 Other specified bacterial agents as the cause of diseases classified elsewhere: Secondary | ICD-10-CM | POA: Insufficient documentation

## 2016-08-01 DIAGNOSIS — N760 Acute vaginitis: Secondary | ICD-10-CM | POA: Insufficient documentation

## 2016-08-01 DIAGNOSIS — I10 Essential (primary) hypertension: Secondary | ICD-10-CM | POA: Diagnosis not present

## 2016-08-01 DIAGNOSIS — F4310 Post-traumatic stress disorder, unspecified: Secondary | ICD-10-CM | POA: Insufficient documentation

## 2016-08-01 DIAGNOSIS — E282 Polycystic ovarian syndrome: Secondary | ICD-10-CM | POA: Diagnosis not present

## 2016-08-01 DIAGNOSIS — Z79899 Other long term (current) drug therapy: Secondary | ICD-10-CM | POA: Diagnosis not present

## 2016-08-01 DIAGNOSIS — E039 Hypothyroidism, unspecified: Secondary | ICD-10-CM | POA: Diagnosis not present

## 2016-08-01 LAB — POCT URINALYSIS DIP (DEVICE)
BILIRUBIN URINE: NEGATIVE
Glucose, UA: NEGATIVE mg/dL
Hgb urine dipstick: NEGATIVE
KETONES UR: NEGATIVE mg/dL
Nitrite: NEGATIVE
PH: 6 (ref 5.0–8.0)
Protein, ur: NEGATIVE mg/dL
SPECIFIC GRAVITY, URINE: 1.02 (ref 1.005–1.030)
Urobilinogen, UA: 0.2 mg/dL (ref 0.0–1.0)

## 2016-08-01 MED ORDER — METRONIDAZOLE 0.75 % VA GEL: 1.0000 | Freq: Every day | VAGINAL | 0 refills | Status: DC

## 2016-08-01 NOTE — ED Provider Notes (Signed)
[place]    CSN: [id number] Arrival date & time: 08/01/16  1325     History   Chief Complaint Chief Complaint  Patient presents with  . Urinary Frequency    HPI Jenine Margrave is a 36 y.o. female.   The history is provided by the patient.  Urinary Frequency  This is a recurrent problem. The current episode started more than 1 week ago. The problem has been gradually worsening (uti sx). Pertinent negatives include no abdominal pain.    Past Medical History:  Diagnosis Date  . Depression   . Diabetes mellitus without complication ([place])   . Hypertension   . Hypothyroidism   . Morbid obesity ([place])   . Post traumatic stress disorder (PTSD)     Patient Active Problem List   Diagnosis Date Noted  . Morbid obesity ([place]) 02/24/2015  . Hypothyroidism 02/24/2015  . PCOS (polycystic ovarian syndrome) 02/24/2015  . Diabetes ([place]) 02/24/2015  . Bipolar 1 disorder, depressed, severe ([place]) 11/24/2014    Past Surgical History:  Procedure Laterality Date  . TONSILLECTOMY    . WISDOM TOOTH EXTRACTION      OB History    No data available       Home Medications    Prior to Admission medications   Medication Sig Start Date End Date Taking? Authorizing Provider  amLODipine (NORVASC) 10 MG tablet Take 1 tablet (10 mg total) by mouth daily. 11/28/14   Kerrie Buffalo, NP  benzonatate (TESSALON) 100 MG capsule Take 1 capsule (100 mg total) by mouth 3 (three) times daily as needed for cough. 06/02/16   Kristen N Ward, DO  buPROPion (WELLBUTRIN XL) 300 MG 24 hr tablet Take 1 tablet (300 mg total) by mouth daily. 02/28/15   Shuvon B Rankin, NP  docusate sodium (COLACE) 100 MG capsule Take 1 capsule (100 mg total) by mouth every 12 (twelve) hours. 06/02/16   Kristen N Ward, DO  lamoTRIgine (LAMICTAL) 25 MG tablet Take 1 tablet (25 mg total) by mouth daily. 02/28/15   Shuvon B Rankin, NP  levothyroxine (SYNTHROID, LEVOTHROID) 50 MCG tablet Take 1 tablet (50 mcg total) by  mouth daily before breakfast. 11/28/14   Kerrie Buffalo, NP  loratadine (CLARITIN) 10 MG tablet Take 1 tablet (10 mg total) by mouth daily. 02/28/15   Shuvon B Rankin, NP  lurasidone 60 MG TABS Take 60 mg by mouth daily with supper. 02/28/15   Shuvon B Rankin, NP  metroNIDAZOLE (FLAGYL) 500 MG tablet Take 1 tablet (500 mg total) by mouth 2 (two) times daily. 06/02/16   Kristen N Ward, DO  metroNIDAZOLE (METROGEL VAGINAL) 0.75 % vaginal gel Place 1 Applicatorful vaginally at bedtime. For 7 nights 08/01/16   Billy Fischer, MD  metroNIDAZOLE (METROGEL VAGINAL) 0.75 % vaginal gel Place 1 Applicatorful vaginally at bedtime. For 7 nights 08/01/16   Billy Fischer, MD  omeprazole (PRILOSEC) 40 MG capsule Take 1 capsule (40 mg total) by mouth daily. 06/02/16   Kristen N Ward, DO  polyethylene glycol (MIRALAX) packet Take 17 g by mouth daily. 06/02/16   Kristen N Ward, DO  traZODone (DESYREL) 100 MG tablet Take 1 tablet (100 mg total) by mouth at bedtime as needed for sleep. 02/28/15   Consuello Closs, NP    Family History Family History  Problem Relation Age of Onset  . Bipolar disorder Father   . Bipolar disorder Mother     Social History Social History  Substance Use Topics  . Smoking  status: Never Smoker  . Smokeless tobacco: Never Used  . Alcohol use No     Allergies   Patient has no known allergies.   Review of Systems Review of Systems  Constitutional: Negative.   Gastrointestinal: Negative.  Negative for abdominal pain.  Genitourinary: Positive for dysuria, frequency, menstrual problem and vaginal discharge. Negative for vaginal bleeding.  All other systems reviewed and are negative.    Physical Exam Triage Vital Signs ED Triage Vitals  Enc Vitals Group     BP 08/01/16 1521 117/80     Pulse Rate 08/01/16 1521 78     Resp 08/01/16 1521 18     Temp 08/01/16 1521 98.6 F (37 C)     Temp Source 08/01/16 1521 Oral     SpO2 08/01/16 1521 100 %     Weight --      Height --       Head Circumference --      Peak Flow --      Pain Score 08/01/16 1520 3     Pain Loc --      Pain Edu? --      Excl. in [place]? --    No data found.   Updated Vital Signs BP 117/80 (BP Location: Right Arm)   Pulse 78   Temp 98.6 F (37 C) (Oral)   Resp 18   LMP 06/28/2016   SpO2 100%   Visual Acuity Right Eye Distance:   Left Eye Distance:   Bilateral Distance:    Right Eye Near:   Left Eye Near:    Bilateral Near:     Physical Exam  Constitutional: She is oriented to person, place, and time. She appears well-developed and well-nourished. No distress.  Neck: Normal range of motion. Neck supple.  Abdominal: Soft. Bowel sounds are normal. There is no tenderness.  Genitourinary: Vagina normal. No vaginal discharge found.  Lymphadenopathy:    She has no cervical adenopathy.  Neurological: She is alert and oriented to person, place, and time.  Skin: Skin is warm and dry.  Nursing note and vitals reviewed.    UC Treatments / Results  Labs (all labs ordered are listed, but only abnormal results are displayed) Labs Reviewed  POCT URINALYSIS DIP (DEVICE) - Abnormal; Notable for the following:       Result Value   Leukocytes, UA SMALL (*)    All other components within normal limits  CERVICOVAGINAL ANCILLARY ONLY  CERVICOVAGINAL ANCILLARY ONLY    EKG  EKG Interpretation None       Radiology No results found.  Procedures Procedures (including critical care time)  Medications Ordered in UC Medications - No data to display   Initial Impression / Assessment and Plan / UC Course  I have reviewed the triage vital signs and the nursing notes.  Pertinent labs & imaging results that were available during my care of the patient were reviewed by me and considered in my medical decision making (see chart for details).       Final Clinical Impressions(s) / UC Diagnoses   Final diagnoses:  BV (bacterial vaginosis)    New Prescriptions Discharge Medication  List as of 08/01/2016  3:58 PM    START taking these medications   Details  metroNIDAZOLE (METROGEL VAGINAL) 0.75 % vaginal gel Place 1 Applicatorful vaginally at bedtime. For 7 nights, Starting Sat 08/01/2016, Normal         Billy Fischer, MD 08/06/16 1430

## 2016-08-01 NOTE — Discharge Instructions (Signed)
We will call with positive test results and treat as indicated  °

## 2016-08-01 NOTE — ED Triage Notes (Signed)
Pt  Reports  Frequent  Urination     X  4  Days    With  Some   Discomfort   She  Also   Reports  A   Sore  Down  There     And   She  Wants  To  Be   Checked for  A  uti

## 2016-08-04 LAB — CERVICOVAGINAL ANCILLARY ONLY
Chlamydia: NEGATIVE
Neisseria Gonorrhea: NEGATIVE
Wet Prep (BD Affirm): NEGATIVE

## 2016-08-09 ENCOUNTER — Encounter (HOSPITAL_COMMUNITY): Payer: Self-pay

## 2016-08-09 ENCOUNTER — Emergency Department (HOSPITAL_COMMUNITY)
Admission: EM | Admit: 2016-08-09 | Discharge: 2016-08-09 | Disposition: A | Discharge status: Home / Self Care | Payer: Medicaid Other | Attending: Emergency Medicine | Admitting: Emergency Medicine

## 2016-08-09 DIAGNOSIS — F329 Major depressive disorder, single episode, unspecified: Secondary | ICD-10-CM | POA: Insufficient documentation

## 2016-08-09 DIAGNOSIS — E119 Type 2 diabetes mellitus without complications: Secondary | ICD-10-CM | POA: Diagnosis not present

## 2016-08-09 DIAGNOSIS — Z79899 Other long term (current) drug therapy: Secondary | ICD-10-CM | POA: Diagnosis not present

## 2016-08-09 DIAGNOSIS — E039 Hypothyroidism, unspecified: Secondary | ICD-10-CM | POA: Diagnosis not present

## 2016-08-09 DIAGNOSIS — F32A Depression, unspecified: Secondary | ICD-10-CM

## 2016-08-09 DIAGNOSIS — I10 Essential (primary) hypertension: Secondary | ICD-10-CM | POA: Diagnosis not present

## 2016-08-09 NOTE — ED Triage Notes (Signed)
Pt complaining of depression and increased stress recently. Pt denies any SI/HI at this time. Pt denies any drugs or ETOH tonight. Pt denies visual or auditory hallucinations.

## 2016-08-09 NOTE — ED Notes (Signed)
The pt is here for depression the pt reports that  She was in crisis depression today  She called crisis counseling and she was told she had to come in to talk with someone .  She takes depression medicine and she reports that she has been taking her medicines.   She does not want to be here.  Asking for food and drink   I asked that she wait until the doctor sees her  Last  Admission for depression was may 2017

## 2016-08-09 NOTE — Discharge Instructions (Signed)
Follow-up with your therapist on Tuesday as scheduled. Return to the emergency department for worsening depression or if you develop suicidal ideations.

## 2016-08-09 NOTE — ED Provider Notes (Signed)
[facility] DEPT Provider Note   CSN: [id number] Arrival date & time: 08/09/16  2128    History   Chief Complaint Chief Complaint  Patient presents with  . Depression    HPI Renee Steele is a 36 y.o. female.  36 year old female with a history of depression, diabetes mellitus, hypertension, and morbid obesity presents to the emergency department for further evaluation of her depression. She states that she was told to come to the emergency department by a crisis counselor that she was speaking with this evening. She states that she called a crisis counselor because her depression got worse this evening. She has been compliant with her medications and has follow-up with her counselor on Tuesday. Patient does have a history of behavioral health hospitalization in May 2017. She states that she did overdose on medications prior to this hospitalization. She states that she is not suicidal or homicidal at this time. When asked if she would harm herself if she left the emergency department, the patient replied "no". Patient with good insight into her depression and use of coping mechanisms. She denies any alcohol or illicit drug use today. She does not desire inpatient hospitalization and does not believe that she needs to speak with a counselor. She states that she has lack of social support and reaching out to the crisis counselor before arrival was helpful enough for her.      Past Medical History:  Diagnosis Date  . Depression   . Diabetes mellitus without complication ([place])   . Hypertension   . Hypothyroidism   . Morbid obesity ([place])   . Post traumatic stress disorder (PTSD)     Patient Active Problem List   Diagnosis Date Noted  . Morbid obesity ([place]) 02/24/2015  . Hypothyroidism 02/24/2015  . PCOS (polycystic ovarian syndrome) 02/24/2015  . Diabetes ([place]) 02/24/2015  . Bipolar 1 disorder, depressed, severe ([place]) 11/24/2014    Past Surgical History:  Procedure Laterality  Date  . TONSILLECTOMY    . WISDOM TOOTH EXTRACTION      OB History    No data available       Home Medications    Prior to Admission medications   Medication Sig Start Date End Date Taking? Authorizing Provider  buPROPion (WELLBUTRIN XL) 300 MG 24 hr tablet Take 1 tablet (300 mg total) by mouth daily. 02/28/15  Yes Shuvon B Rankin, NP  Cyanocobalamin (VITAMIN B-12 PO) Take 1 tablet by mouth daily.   Yes Historical Provider, MD  levothyroxine (SYNTHROID, LEVOTHROID) 50 MCG tablet Take 1 tablet (50 mcg total) by mouth daily before breakfast. 11/28/14  Yes Kerrie Buffalo, NP  loratadine (CLARITIN) 10 MG tablet Take 1 tablet (10 mg total) by mouth daily. Patient taking differently: Take 10 mg by mouth daily as needed for allergies.  02/28/15  Yes Shuvon B Rankin, NP  metroNIDAZOLE (METROGEL VAGINAL) 0.75 % vaginal gel Place 1 Applicatorful vaginally at bedtime. For 7 nights 08/01/16  Yes Billy Fischer, MD  Multiple Vitamin (MULTIVITAMIN WITH MINERALS) TABS tablet Take 1 tablet by mouth daily.   Yes Historical Provider, MD  omeprazole (PRILOSEC) 40 MG capsule Take 1 capsule (40 mg total) by mouth daily. Patient taking differently: Take 40 mg by mouth daily as needed (acid reflux).  06/02/16  Yes Kristen N Ward, DO  OXcarbazepine (TRILEPTAL) 150 MG tablet Take 150 mg by mouth daily.   Yes Historical Provider, MD  polyethylene glycol (MIRALAX) packet Take 17 g by mouth daily. Patient taking differently:  Take 17 g by mouth daily as needed for mild constipation.  06/02/16  Yes Kristen N Ward, DO    Family History Family History  Problem Relation Age of Onset  . Bipolar disorder Father   . Bipolar disorder Mother     Social History Social History  Substance Use Topics  . Smoking status: Never Smoker  . Smokeless tobacco: Never Used  . Alcohol use No     Allergies   Lisinopril   Review of Systems Review of Systems Ten systems reviewed and are negative for acute change, except as  noted in the HPI.    Physical Exam Updated Vital Signs BP (!) 162/105 (BP Location: Left Arm)   Pulse 86   Temp 98.3 F (36.8 C) (Oral)   Resp 20   Ht 5\' 8"  (1.727 m)   Wt (!) 187.7 kg   SpO2 98%   BMI 62.90 kg/m   Physical Exam  Constitutional: She is oriented to person, place, and time. She appears well-developed and well-nourished. No distress.  Patient calm and cooperative, in NAD  HENT:  Head: Normocephalic and atraumatic.  Eyes: Conjunctivae and EOM are normal. No scleral icterus.  Neck: Normal range of motion.  Cardiovascular: Normal rate, regular rhythm and intact distal pulses.   Pulmonary/Chest: Effort normal. No respiratory distress.  Musculoskeletal: Normal range of motion.  Neurological: She is alert and oriented to person, place, and time. She exhibits normal muscle tone. Coordination normal.  Skin: Skin is warm and dry. No rash noted. She is not diaphoretic. No erythema. No pallor.  Psychiatric: Her behavior is normal. Her mood appears anxious. She exhibits a depressed mood (mild). She expresses no homicidal and no suicidal ideation.  Nursing note and vitals reviewed.    ED Treatments / Results  Labs (all labs ordered are listed, but only abnormal results are displayed) Labs Reviewed - No data to display  EKG  EKG Interpretation None       Radiology No results found.  Procedures Procedures (including critical care time)  Medications Ordered in ED Medications - No data to display   Initial Impression / Assessment and Plan / ED Course  I have reviewed the triage vital signs and the nursing notes.  Pertinent labs & imaging results that were available during my care of the patient were reviewed by me and considered in my medical decision making (see chart for details).     36 year old female presents to the emergency department for evaluation of depression. She states that her depression worsened this evening, but improved after speaking with a  crisis counselor. She states that she is not suicidal and she has no homicidal ideations. She reports that she will not harm herself if she were to leave the emergency department today. She does not feel as though she needs to speak with a counselor or psychiatrist. She states that she has close outpatient follow-up with her therapist within 48 hours. She has been compliant with her medications and has good insight into her depression and use of coping mechanisms. I do not believe the patient is a harm to herself at this time. She is requesting discharge and I believe this is reasonable. Return precautions discussed and provided. Patient discharged in stable condition with no unaddressed concerns.   Final Clinical Impressions(s) / ED Diagnoses   Final diagnoses:  Depression, unspecified depression type    New Prescriptions New Prescriptions   No medications on file     Antonietta Breach, PA-C  08/09/16 2323    Noemi Chapel, MD 08/19/16 1024

## 2016-08-13 ENCOUNTER — Ambulatory Visit: Payer: Medicaid Other | Attending: Internal Medicine | Admitting: Physician Assistant

## 2016-08-13 VITALS — BP 156/97 | HR 90 | Temp 98.7°F | Resp 16 | Wt >= 6400 oz

## 2016-08-13 DIAGNOSIS — Z7984 Long term (current) use of oral hypoglycemic drugs: Secondary | ICD-10-CM | POA: Diagnosis not present

## 2016-08-13 DIAGNOSIS — Z5189 Encounter for other specified aftercare: Secondary | ICD-10-CM | POA: Diagnosis not present

## 2016-08-13 DIAGNOSIS — E039 Hypothyroidism, unspecified: Secondary | ICD-10-CM | POA: Diagnosis not present

## 2016-08-13 DIAGNOSIS — Z79899 Other long term (current) drug therapy: Secondary | ICD-10-CM | POA: Diagnosis not present

## 2016-08-13 DIAGNOSIS — E119 Type 2 diabetes mellitus without complications: Secondary | ICD-10-CM | POA: Insufficient documentation

## 2016-08-13 DIAGNOSIS — E282 Polycystic ovarian syndrome: Secondary | ICD-10-CM | POA: Insufficient documentation

## 2016-08-13 DIAGNOSIS — F329 Major depressive disorder, single episode, unspecified: Secondary | ICD-10-CM | POA: Diagnosis not present

## 2016-08-13 DIAGNOSIS — I10 Essential (primary) hypertension: Secondary | ICD-10-CM | POA: Diagnosis not present

## 2016-08-13 LAB — POCT GLYCOSYLATED HEMOGLOBIN (HGB A1C): Hemoglobin A1C: 5.1

## 2016-08-13 LAB — GLUCOSE, POCT (MANUAL RESULT ENTRY): POC Glucose: 157 mg/dl — AB (ref 70–99)

## 2016-08-13 LAB — TSH: TSH: 0.93 mIU/L

## 2016-08-13 MED ORDER — METFORMIN HCL 500 MG PO TABS: 500.0000 mg | ORAL_TABLET | Freq: Two times a day (BID) | ORAL | 3 refills | Status: DC

## 2016-08-13 MED ORDER — HYDROCHLOROTHIAZIDE 25 MG PO TABS: 25.0000 mg | ORAL_TABLET | Freq: Every day | ORAL | 3 refills | Status: DC

## 2016-08-13 MED ORDER — AMLODIPINE BESYLATE 5 MG PO TABS: 5.0000 mg | ORAL_TABLET | Freq: Every day | ORAL | 3 refills | Status: DC

## 2016-08-13 NOTE — Patient Instructions (Signed)
Check blood pressure 3 times per week and record and bring to next visit.

## 2016-08-13 NOTE — Progress Notes (Signed)
Patient ID: Renee Steele, female   DOB: 10-03-1980, 36 y.o.   MRN: [id number] Seen in the ED on 08/09/2016 for evaluation of depression.  She sees a Social worker at [facility].  She also has a h/o DM and htn.  She is compliant on her medications.  Also had recent STI w/up that was overall unremarkable except for mild BV.      Renee Steele, is a 36 y.o. female  [id number]  [id number]  DOB - 1980/07/17  Subjective:  Chief Complaint and HPI: Renee Steele is a 36 y.o. female here today to establish care and for a follow up visit after being Seen in the ED on 08/09/2016 for evaluation of depression.  She sees a Social worker at [facility].  She also has a h/o DM and htn.  She is compliant on her medications Wellbutrin and Tripleptal.  Also had recent STI w/up that was overall unremarkable except for mild BV.    She was previously on Lisinopril/HCT which seemed to cause cough and was also on amlodipine.  She stopped all of them.  She was on metformin about 5 years ago.  She also has PCOS.  She wants to get her hormones checked.  She also wants a note saying she can't use the gym.   She would like a referral for bariatric evaluation.  ED/Hospital notes reviewed.     ROS:   Constitutional:  No f/c, No night sweats, No unexplained weight loss. EENT:  No vision changes, No blurry vision, No hearing changes. No mouth, throat, or ear problems.  Respiratory: No cough, No SOB Cardiac: No CP, no palpitations GI:  No abd pain, No N/V/D. GU: No Urinary s/sx Musculoskeletal: No joint pain Neuro: No headache, no dizziness, no motor weakness.  Skin: No rash Endocrine:  No polydipsia. No polyuria.  Psych: Denies SI/HI  No problems updated.  ALLERGIES: Allergies  Allergen Reactions  . Lisinopril Shortness Of Breath and Cough    PAST MEDICAL HISTORY: Past Medical History:  Diagnosis Date  . Depression   . Diabetes mellitus without complication ([place])     . Hypertension   . Hypothyroidism   . Morbid obesity ([place])   . Post traumatic stress disorder (PTSD)     MEDICATIONS AT HOME: Prior to Admission medications   Medication Sig Start Date End Date Taking? Authorizing Provider  buPROPion (WELLBUTRIN XL) 300 MG 24 hr tablet Take 1 tablet (300 mg total) by mouth daily. 02/28/15   Shuvon B Rankin, NP  Cyanocobalamin (VITAMIN B-12 PO) Take 1 tablet by mouth daily.    Historical Provider, MD  hydrochlorothiazide (HYDRODIURIL) 25 MG tablet Take 1 tablet (25 mg total) by mouth daily. 08/13/16   Argentina Donovan, PA-C  levothyroxine (SYNTHROID, LEVOTHROID) 50 MCG tablet Take 1 tablet (50 mcg total) by mouth daily before breakfast. 11/28/14   Kerrie Buffalo, NP  loratadine (CLARITIN) 10 MG tablet Take 1 tablet (10 mg total) by mouth daily. Patient taking differently: Take 10 mg by mouth daily as needed for allergies.  02/28/15   Shuvon B Rankin, NP  metFORMIN (GLUCOPHAGE) 500 MG tablet Take 1 tablet (500 mg total) by mouth 2 (two) times daily with a meal. 08/13/16   Argentina Donovan, PA-C  metroNIDAZOLE (METROGEL VAGINAL) 0.75 % vaginal gel Place 1 Applicatorful vaginally at bedtime. For 7 nights 08/01/16   Billy Fischer, MD  Multiple Vitamin (MULTIVITAMIN WITH MINERALS) TABS tablet Take 1 tablet by mouth daily.  Historical Provider, MD  omeprazole (PRILOSEC) 40 MG capsule Take 1 capsule (40 mg total) by mouth daily. Patient taking differently: Take 40 mg by mouth daily as needed (acid reflux).  06/02/16   Kristen N Ward, DO  OXcarbazepine (TRILEPTAL) 150 MG tablet Take 150 mg by mouth daily.    Historical Provider, MD  polyethylene glycol (MIRALAX) packet Take 17 g by mouth daily. Patient taking differently: Take 17 g by mouth daily as needed for mild constipation.  06/02/16   Kristen N Ward, DO     Objective:  EXAM:   Vitals:   08/13/16 1355  BP: (!) 156/97  Pulse: 90  Resp: 16  Temp: 98.7 F (37.1 C)  TempSrc: Oral  SpO2: 95%  Weight: (!) 416  lb (188.7 kg)    General appearance : A&OX3. NAD. Non-toxic-appearing HEENT: Atraumatic and Normocephalic.  PERRLA. EOM intact.   Neck: supple, no JVD. No cervical lymphadenopathy. No thyromegaly Chest/Lungs:  Breathing-non-labored, Good air entry bilaterally, breath sounds normal without rales, rhonchi, or wheezing  CVS: S1 S2 regular, no murmurs, gallops, rubs  Extremities: Bilateral Lower Ext shows no edema, both legs are warm to touch with = pulse throughout Neurology:  CN II-XII grossly intact, Non focal.   Psych:  TP linear. J/I WNL. Normal speech. Appropriate eye contact and affect.  Skin:  No Rash  Data Review Lab Results  Component Value Date   HGBA1C 5.1 08/13/2016   HGBA1C 5.5 11/25/2014     Assessment & Plan   1. PCOS (polycystic ovarian syndrome) - POCT glucose (manual entry) - POCT glycosylated hemoglobin (Hb A1C) - metFORMIN (GLUCOPHAGE) 500 MG tablet; Take 1 tablet (500 mg total) by mouth 2 (two) times daily with a meal.  Dispense: 180 tablet; Refill: 3  2. Hypothyroidism, unspecified type - TSH  3. Hypertension, unspecified type Start HCTZ.  Check BP 3- times/week and record and bring to next visit.  4. Morbid obesity ([place]) - Amb Referral to Bariatric Surgery Also recommended OA  Spent >40 mins face to face with patient addressing various issues, gym note, forms for financial assistance, discussing restarting metformin, limiting sugar intake, lifestyle choices and self-care, in a ddition to encouraging her to seek help through GA/OA/other 12 step recovery programs.    Patient have been counseled extensively about nutrition and exercise  Return in about 2 weeks (around 08/27/2016) for assign PCP; f/up TSH/htn/blood sugars/PCOS.  The patient was given clear instructions to go to ER or return to medical center if symptoms don't improve, worsen or new problems develop. The patient verbalized understanding. The patient was told to call to get lab results if they  haven't heard anything in the next week.     Freeman Caldron, PA-C [facility] [employer], [place]   08/13/2016, 2:34 PM

## 2016-08-14 ENCOUNTER — Telehealth: Payer: Self-pay

## 2016-08-14 NOTE — Telephone Encounter (Signed)
Contacted pt to go over lab results pt is aware of results and doesn't have any questions or concerns 

## 2016-08-17 ENCOUNTER — Ambulatory Visit: Payer: Self-pay | Admitting: Licensed Clinical Social Worker

## 2016-08-17 ENCOUNTER — Ambulatory Visit: Payer: Medicaid Other | Attending: Internal Medicine | Admitting: Licensed Clinical Social Worker

## 2016-08-17 DIAGNOSIS — F329 Major depressive disorder, single episode, unspecified: Secondary | ICD-10-CM | POA: Diagnosis present

## 2016-08-17 DIAGNOSIS — F314 Bipolar disorder, current episode depressed, severe, without psychotic features: Secondary | ICD-10-CM | POA: Diagnosis not present

## 2016-08-19 NOTE — BH Specialist Note (Signed)
Session Start time: 3:00 PM   End Time: 3:45 PM Total Time:  45 minutes Type of Service: [place] Interpreter: No.   Interpreter Name & Language: N/A # [facility] Visits July 2017-June 2018: 1st   SUBJECTIVE: Renee Steele is a 36 y.o. female  Pt. was referred by Weyman Pedro for:  depression. Pt. reports the following symptoms/concerns: feeling sadness and worry, difficulty sleeping, low energy, difficulty concentrating, and suicidal ideations Duration of problem:  Ongoing  Pt reports being diagnosed with Bi Polar disorder 10 years ago Severity: moderately severe Previous treatment: Pt has received behavioral health services at [facility] in [facility]. She is currently receiving services through [facility] (upcoming appointment March 2018)   OBJECTIVE: Mood: Pleasant & Affect: Appropriate Risk of harm to self or others: Pt has hx of suicidal ideations. Pt denies plan or intent to self-harm and/or harm others Assessments administered: None administered  LIFE CONTEXT:  Family & Social: Pt resides on her own School/ Work: Pt is unemployed. She receives Fish farm manager [phone]) and is in the [place] through [employer] for rental assistance Self-Care: No report of substance use Life changes: Pt reports increased stress due to difficulty managing finances. What is important to pt/family (values): Independence   GOALS ADDRESSED:  Decrease symptoms of depression Decrease symptoms of anxiety Increase adequate support systems for patient  INTERVENTIONS: Solution Focused, Strength-based and Supportive   ASSESSMENT:  Pt currently experiencing depression and anxiety triggered by financial strain. Pt receives Fish farm manager and rental assistance from [employer]; however, has difficulty managing finances appropriately. She reports feeling sadness and worry, difficulty sleeping, low energy, difficulty concentrating, and suicidal  ideations. Pt may benefit from psychotherapy and medication management. LCSWA educated pt on the cycle of depression and anxiety and discussed healthy coping skills to decrease symptoms. Pt verbalized understanding of how psychosocial stressors can negatively impact one's mental and physical health. LCSWA inquired about protective factors and assisted in creating a safety plan for crisis. LCSWA provided pt with resources to assist with utilities, information on obtaining a payee to assist with financial responsibilities, and crisis intervention. Pt is currently participating in the [employer] [place] program to assist with job readiness. Pt has a scheduled appointment March 2018 with [facility] for behavioral health services (psychotherapy and medication management)      PLAN: 1. F/U with behavioral health clinician: Pt was encouraged to contact [facility] if symptoms worsen or fail to improve to schedule behavioral appointments at [facility]. 2. Behavioral Health meds: Wellbutrin 3. Behavioral recommendations: LCSWA recommends that pt apply healthy coping skills discussed and utilize resources as needed. Pt is encouraged to schedule follow up appointment with LCSWA 4. Referral: Brief Counseling/Psychotherapy, [employer], Problem-solving teaching/coping strategies, Psychoeducation and Supportive Counseling 5. From scale of 1-10, how likely are you to follow plan: 9/10   Rebekah [place], MSW, [place] [employer] 08/19/16 5:37 PM  Warmhandoff: no

## 2016-08-27 ENCOUNTER — Ambulatory Visit: Payer: Medicaid Other | Attending: Family Medicine | Admitting: Family Medicine

## 2016-08-27 ENCOUNTER — Encounter: Payer: Self-pay | Admitting: Family Medicine

## 2016-08-27 VITALS — BP 130/83 | HR 99 | Temp 98.6°F | Resp 18 | Ht 68.0 in | Wt >= 6400 oz

## 2016-08-27 DIAGNOSIS — Z7984 Long term (current) use of oral hypoglycemic drugs: Secondary | ICD-10-CM | POA: Diagnosis not present

## 2016-08-27 DIAGNOSIS — Z6841 Body Mass Index (BMI) 40.0 and over, adult: Secondary | ICD-10-CM | POA: Diagnosis not present

## 2016-08-27 DIAGNOSIS — I10 Essential (primary) hypertension: Secondary | ICD-10-CM | POA: Diagnosis not present

## 2016-08-27 DIAGNOSIS — F319 Bipolar disorder, unspecified: Secondary | ICD-10-CM | POA: Diagnosis not present

## 2016-08-27 DIAGNOSIS — E282 Polycystic ovarian syndrome: Secondary | ICD-10-CM

## 2016-08-27 DIAGNOSIS — E039 Hypothyroidism, unspecified: Secondary | ICD-10-CM | POA: Diagnosis not present

## 2016-08-27 DIAGNOSIS — Z79899 Other long term (current) drug therapy: Secondary | ICD-10-CM | POA: Diagnosis not present

## 2016-08-27 DIAGNOSIS — Z0001 Encounter for general adult medical examination with abnormal findings: Secondary | ICD-10-CM | POA: Diagnosis present

## 2016-08-27 LAB — GLUCOSE, POCT (MANUAL RESULT ENTRY): POC Glucose: 114 mg/dl — AB (ref 70–99)

## 2016-08-27 MED ORDER — HYDROCHLOROTHIAZIDE 12.5 MG PO TABS: 12.5000 mg | ORAL_TABLET | Freq: Every day | ORAL | 2 refills | Status: DC

## 2016-08-27 MED ORDER — LEVOTHYROXINE SODIUM 50 MCG PO TABS: 50.0000 ug | ORAL_TABLET | Freq: Every day | ORAL | 1 refills | Status: DC

## 2016-08-27 MED ORDER — METFORMIN HCL 500 MG PO TABS: 500.0000 mg | ORAL_TABLET | Freq: Two times a day (BID) | ORAL | 2 refills | Status: DC

## 2016-08-27 NOTE — Progress Notes (Signed)
Patient is her for F/Up TSH blood sugars, DM  Patient has eaten today  Patient has taking her meds today  Patient denies pain today

## 2016-08-27 NOTE — Progress Notes (Signed)
Subjective:  Patient ID: Renee Steele, female    DOB: Oct 25, 1980  Age: 36 y.o. MRN: [id number]  CC: No chief complaint on file.   HPI Renee Steele presents for   1. Hypothyroidism:TSH levels recently taken at previous visit show hypothyroidsim is well controlled on current dosage.   2. PCOS: Reports past history of nonadherence with metformin. She is unsure of why she needs to take metformin.. Reports desire to lose weight through healthy diet and lifestyle changes.   3. Hypertension: Denies any chest pain, shortness of breath, or swelling of the bilateral lower extremities. Reports not taking her antihypertensive medication today.   4. History of bipolar disorder: Reports seeing  psychiatrist regularly and compliance with psychiatric medicatons. Denies SI/HI.   Outpatient Medications Prior to Visit  Medication Sig Dispense Refill  . buPROPion (WELLBUTRIN XL) 300 MG 24 hr tablet Take 1 tablet (300 mg total) by mouth daily. 30 tablet 0  . Cyanocobalamin (VITAMIN B-12 PO) Take 1 tablet by mouth daily.    Marland Kitchen loratadine (CLARITIN) 10 MG tablet Take 1 tablet (10 mg total) by mouth daily. (Patient taking differently: Take 10 mg by mouth daily as needed for allergies. ) 30 tablet 0  . metroNIDAZOLE (METROGEL VAGINAL) 0.75 % vaginal gel Place 1 Applicatorful vaginally at bedtime. For 7 nights 70 g 0  . Multiple Vitamin (MULTIVITAMIN WITH MINERALS) TABS tablet Take 1 tablet by mouth daily.    Marland Kitchen omeprazole (PRILOSEC) 40 MG capsule Take 1 capsule (40 mg total) by mouth daily. (Patient taking differently: Take 40 mg by mouth daily as needed (acid reflux). ) 30 capsule 1  . OXcarbazepine (TRILEPTAL) 150 MG tablet Take 150 mg by mouth daily.    . polyethylene glycol (MIRALAX) packet Take 17 g by mouth daily. (Patient taking differently: Take 17 g by mouth daily as needed for mild constipation. ) 30 packet 1  . hydrochlorothiazide (HYDRODIURIL) 25 MG tablet Take 1 tablet (25 mg total) by mouth  daily. 90 tablet 3  . levothyroxine (SYNTHROID, LEVOTHROID) 50 MCG tablet Take 1 tablet (50 mcg total) by mouth daily before breakfast. 30 tablet 0  . metFORMIN (GLUCOPHAGE) 500 MG tablet Take 1 tablet (500 mg total) by mouth 2 (two) times daily with a meal. 180 tablet 3   No facility-administered medications prior to visit.     ROS Review of Systems  Eyes: Negative.   Respiratory: Negative.   Cardiovascular: Negative.   Gastrointestinal: Negative.   Endocrine: Negative for cold intolerance and heat intolerance.  Psychiatric/Behavioral:       History of bipolar disorder.    Objective:  BP 130/83 (BP Location: Left Arm, Patient Position: Sitting, Cuff Size: Normal)   Pulse 99   Temp 98.6 F (37 C) (Oral)   Resp 18   Ht 5\' 8"  (1.727 m)   Wt (!) 415 lb (188.2 kg)   SpO2 97%   BMI 63.10 kg/m   BP/Weight 08/27/2016 [id number]  Systolic BP [id number]  Diastolic BP 83 97 [id number]  Wt. (Lbs) 415 416 413.7  BMI 63.1 63.25 62.9  Some encounter information is confidential and restricted. Go to Review Flowsheets activity to see all data.     Physical Exam  Eyes: Conjunctivae are normal. Pupils are equal, round, and reactive to light.  Neck: No thyromegaly present.  Cardiovascular: Normal rate, regular rhythm, normal heart sounds and intact distal pulses.   Pulmonary/Chest: Effort normal and breath sounds normal.  Abdominal: Soft. Bowel sounds are normal.  Skin: Skin is warm and dry.  Psychiatric: Her affect is labile. Her speech is rapid and/or pressured. She is agitated. She expresses no suicidal plans and no homicidal plans.  Nursing note and vitals reviewed.  Assessment & Plan:   Problem List Items Addressed This Visit      Endocrine   Hypothyroidism   Relevant Medications   levothyroxine (SYNTHROID, LEVOTHROID) 50 MCG tablet   PCOS (polycystic ovarian syndrome) - Primary   Relevant Medications   metFORMIN (GLUCOPHAGE) 500 MG tablet   Other Relevant Orders    Glucose (CBG) (Completed)     Other   Morbid obesity (HCC)   Relevant Medications   metFORMIN (GLUCOPHAGE) 500 MG tablet   Other Relevant Orders   Amb ref to Medical Nutrition Therapy-MNT    Other Visit Diagnoses    Essential hypertension       Relevant Medications   hydrochlorothiazide (HYDRODIURIL) 12.5 MG tablet      Meds ordered this encounter  Medications  . levothyroxine (SYNTHROID, LEVOTHROID) 50 MCG tablet    Sig: Take 1 tablet (50 mcg total) by mouth daily.    Dispense:  30 tablet    Refill:  1  . metFORMIN (GLUCOPHAGE) 500 MG tablet    Sig: Take 1 tablet (500 mg total) by mouth 2 (two) times daily with a meal.    Dispense:  60 tablet    Refill:  2  . hydrochlorothiazide (HYDRODIURIL) 12.5 MG tablet    Sig: Take 1 tablet (12.5 mg total) by mouth daily.    Dispense:  30 tablet    Refill:  2    Follow-up: Return in about 6 weeks (around 10/08/2016) for Thyroid .   Alfonse Spruce FNP

## 2016-08-27 NOTE — Patient Instructions (Addendum)
Diet for Polycystic Ovarian Syndrome Introduction Polycystic ovary syndrome (PCOS) is a disorder of the chemical messengers (hormones) that regulate menstruation. The condition causes important hormones to be out of balance. PCOS can:  Make your periods irregular or stop.  Cause cysts to develop on the ovaries.  Make it difficult to get pregnant.  Stop your body from responding to the effects of insulin (insulin resistance), which can lead to obesity and diabetes. Changing what you eat can help manage PCOS and improve your health. It can help you lose weight and improve the way your body uses insulin. What is my plan?  Eat breakfast, lunch, and dinner plus two snacks every day.  Include protein in each meal and snack.  Choose whole grains instead of products made with refined flour.  Eat a variety of foods.  Exercise regularly as told by your health care provider. What do I need to know about this eating plan? If you are overweight or obese, pay attention to how many calories you eat. Cutting down on calories can help you lose weight. Work with your health care provider or dietitian to figure out how many calories you need each day. What foods can I eat? Grains  Whole grains, such as whole wheat. Whole-grain breads, crackers, cereals, and pasta. Unsweetened oatmeal, bulgur, barley, quinoa, or brown rice. Corn or whole-wheat flour tortillas. Vegetables  Lettuce. Spinach. Peas. Beets. Cauliflower. Cabbage. Broccoli. Carrots. Tomatoes. Squash. Eggplant. Herbs. Peppers. Onions. Cucumbers. Brussels sprouts. Fruits  Berries. Bananas. Apples. Oranges. Grapes. Papaya. Mango. Pomegranate. Kiwi. Grapefruit. Cherries. Meats and Other Protein Sources  Lean proteins, such as fish, chicken, beans, eggs, and tofu. Dairy  Low-fat dairy products, such as skim milk, cheese sticks, and yogurt. Beverages  Low-fat or fat-free drinks, such as water, low-fat milk, sugar-free drinks, and 100% fruit  juice. Condiments  Ketchup. Mustard. Barbecue sauce. Relish. Low-fat or fat-free mayonnaise. Fats and Oils  Olive oil or canola oil. Walnuts and almonds. The items listed above may not be a complete list of recommended foods or beverages. Contact your dietitian for more options.  What foods are not recommended? Foods high in calories or fat. Fried foods. Sweets. Products made from refined white flour, including white bread, pastries, white rice, and pasta. The items listed above may not be a complete list of foods and beverages to avoid. Contact your dietitian for more information.  This information is not intended to replace advice given to you by your health care provider. Make sure you discuss any questions you have with your health care provider. Document Released: 10/14/2015 Document Revised: 11/28/2015 Document Reviewed: 07/04/2014  2017 Elsevier  Exercising to Lose Weight Introduction Exercising can help you to lose weight. In order to lose weight through exercise, you need to do vigorous-intensity exercise. You can tell that you are exercising with vigorous intensity if you are breathing very hard and fast and cannot hold a conversation while exercising. Moderate-intensity exercise helps to maintain your current weight. You can tell that you are exercising at a moderate level if you have a higher heart rate and faster breathing, but you are still able to hold a conversation. How often should I exercise? Choose an activity that you enjoy and set realistic goals. Your health care provider can help you to make an activity plan that works for you. Exercise regularly as directed by your health care provider. This may include:  Doing resistance training twice each week, such as:  Push-ups.  Sit-ups.  Lifting weights.  Using  resistance bands.  Doing a given intensity of exercise for a given amount of time. Choose from these options:  150 minutes of moderate-intensity exercise every  week.  75 minutes of vigorous-intensity exercise every week.  A mix of moderate-intensity and vigorous-intensity exercise every week. Children, pregnant women, people who are out of shape, people who are overweight, and older adults may need to consult a health care provider for individual recommendations. If you have any sort of medical condition, be sure to consult your health care provider before starting a new exercise program. What are some activities that can help me to lose weight?  Walking at a rate of at least 4.5 miles an hour.  Jogging or running at a rate of 5 miles per hour.  Biking at a rate of at least 10 miles per hour.  Lap swimming.  Roller-skating or in-line skating.  Cross-country skiing.  Vigorous competitive sports, such as football, basketball, and soccer.  Jumping rope.  Aerobic dancing. How can I be more active in my day-to-day activities?  Use the stairs instead of the elevator.  Take a walk during your lunch break.  If you drive, park your car farther away from work or school.  If you take public transportation, get off one stop early and walk the rest of the way.  Make all of your phone calls while standing up and walking around.  Get up, stretch, and walk around every 30 minutes throughout the day. What guidelines should I follow while exercising?  Do not exercise so much that you hurt yourself, feel dizzy, or get very short of breath.  Consult your health care provider prior to starting a new exercise program.  Wear comfortable clothes and shoes with good support.  Drink plenty of water while you exercise to prevent dehydration or heat stroke. Body water is lost during exercise and must be replaced.  Work out until you breathe faster and your heart beats faster. This information is not intended to replace advice given to you by your health care provider. Make sure you discuss any questions you have with your health care  provider. Document Released: 07/25/2010 Document Revised: 11/28/2015 Document Reviewed: 11/23/2013  2017 Elsevier

## 2016-10-05 ENCOUNTER — Ambulatory Visit: Payer: Self-pay | Admitting: Registered"

## 2016-10-09 ENCOUNTER — Encounter (HOSPITAL_COMMUNITY): Payer: Self-pay | Admitting: Nurse Practitioner

## 2016-10-09 ENCOUNTER — Emergency Department (HOSPITAL_COMMUNITY)
Admission: EM | Admit: 2016-10-09 | Discharge: 2016-10-10 | Disposition: A | Discharge status: Other Acute Inpatient Hospital | Payer: Medicaid Other | Attending: Emergency Medicine | Admitting: Emergency Medicine

## 2016-10-09 DIAGNOSIS — Z7984 Long term (current) use of oral hypoglycemic drugs: Secondary | ICD-10-CM | POA: Insufficient documentation

## 2016-10-09 DIAGNOSIS — E119 Type 2 diabetes mellitus without complications: Secondary | ICD-10-CM | POA: Insufficient documentation

## 2016-10-09 DIAGNOSIS — E039 Hypothyroidism, unspecified: Secondary | ICD-10-CM | POA: Diagnosis not present

## 2016-10-09 DIAGNOSIS — F329 Major depressive disorder, single episode, unspecified: Secondary | ICD-10-CM | POA: Diagnosis not present

## 2016-10-09 DIAGNOSIS — R45851 Suicidal ideations: Secondary | ICD-10-CM | POA: Insufficient documentation

## 2016-10-09 DIAGNOSIS — Z79899 Other long term (current) drug therapy: Secondary | ICD-10-CM | POA: Insufficient documentation

## 2016-10-09 DIAGNOSIS — I10 Essential (primary) hypertension: Secondary | ICD-10-CM | POA: Diagnosis not present

## 2016-10-09 HISTORY — DX: Acute vaginitis: N76.0

## 2016-10-09 HISTORY — DX: Papillomavirus as the cause of diseases classified elsewhere: B97.7

## 2016-10-09 HISTORY — DX: Other specified bacterial agents as the cause of diseases classified elsewhere: B96.89

## 2016-10-09 HISTORY — DX: Obstructive sleep apnea (adult) (pediatric): G47.33

## 2016-10-09 HISTORY — DX: Dependence on other enabling machines and devices: Z99.89

## 2016-10-09 HISTORY — DX: Chlamydial infection, unspecified: A74.9

## 2016-10-09 HISTORY — DX: Urinary tract infection, site not specified: N39.0

## 2016-10-09 LAB — COMPREHENSIVE METABOLIC PANEL
ALT: 20 U/L (ref 14–54)
AST: 18 U/L (ref 15–41)
Albumin: 3.5 g/dL (ref 3.5–5.0)
Alkaline Phosphatase: 74 U/L (ref 38–126)
Anion gap: 7 (ref 5–15)
BILIRUBIN TOTAL: 0.5 mg/dL (ref 0.3–1.2)
BUN: 9 mg/dL (ref 6–20)
CALCIUM: 9.2 mg/dL (ref 8.9–10.3)
CHLORIDE: 108 mmol/L (ref 101–111)
CO2: 27 mmol/L (ref 22–32)
CREATININE: 0.69 mg/dL (ref 0.44–1.00)
Glucose, Bld: 130 mg/dL — ABNORMAL HIGH (ref 65–99)
Potassium: 3.4 mmol/L — ABNORMAL LOW (ref 3.5–5.1)
Sodium: 142 mmol/L (ref 135–145)
TOTAL PROTEIN: 6.9 g/dL (ref 6.5–8.1)

## 2016-10-09 LAB — CBC
HCT: 40.2 % (ref 36.0–46.0)
Hemoglobin: 12.8 g/dL (ref 12.0–15.0)
MCH: 25.7 pg — ABNORMAL LOW (ref 26.0–34.0)
MCHC: 31.8 g/dL (ref 30.0–36.0)
MCV: 80.7 fL (ref 78.0–100.0)
Platelets: 386 10*3/uL (ref 150–400)
RBC: 4.98 MIL/uL (ref 3.87–5.11)
RDW: 12.6 % (ref 11.5–15.5)
WBC: 6.5 10*3/uL (ref 4.0–10.5)

## 2016-10-09 LAB — RAPID URINE DRUG SCREEN, HOSP PERFORMED
Amphetamines: NOT DETECTED
Barbiturates: NOT DETECTED
Benzodiazepines: NOT DETECTED
Cocaine: NOT DETECTED
Opiates: NOT DETECTED
Tetrahydrocannabinol: NOT DETECTED

## 2016-10-09 LAB — ETHANOL

## 2016-10-09 LAB — ACETAMINOPHEN LEVEL: Acetaminophen (Tylenol), Serum: <10 ug/mL — ABNORMAL LOW (ref 10–30)

## 2016-10-09 LAB — I-STAT BETA HCG BLOOD, ED (MC, WL, AP ONLY)

## 2016-10-09 LAB — SALICYLATE LEVEL

## 2016-10-09 MED ORDER — LEVOTHYROXINE SODIUM 50 MCG PO TABS
50.0000 ug | ORAL_TABLET | Freq: Every day | ORAL | Status: DC
  Administered 2016-10-10: 50 ug via ORAL
  Filled 2016-10-09: qty 1

## 2016-10-09 MED ORDER — IBUPROFEN 400 MG PO TABS
600.0000 mg | ORAL_TABLET | Freq: Four times a day (QID) | ORAL | Status: DC | PRN
  Administered 2016-10-09: 600 mg via ORAL
  Filled 2016-10-09: qty 1

## 2016-10-09 MED ORDER — PANTOPRAZOLE SODIUM 40 MG PO TBEC
40.0000 mg | DELAYED_RELEASE_TABLET | Freq: Every day | ORAL | Status: DC
  Administered 2016-10-09 – 2016-10-10 (×2): 40 mg via ORAL
  Filled 2016-10-09 (×2): qty 1

## 2016-10-09 MED ORDER — ZOLPIDEM TARTRATE 5 MG PO TABS
5.0000 mg | ORAL_TABLET | Freq: Every evening | ORAL | Status: DC | PRN
  Administered 2016-10-09: 5 mg via ORAL
  Filled 2016-10-09: qty 1

## 2016-10-09 MED ORDER — HYDROCHLOROTHIAZIDE 25 MG PO TABS
12.5000 mg | ORAL_TABLET | Freq: Every day | ORAL | Status: DC
Start: 2016-10-09 — End: 2016-10-10
  Administered 2016-10-09 – 2016-10-10 (×2): 12.5 mg via ORAL
  Filled 2016-10-09 (×2): qty 1

## 2016-10-09 MED ORDER — METFORMIN HCL 500 MG PO TABS
500.0000 mg | ORAL_TABLET | Freq: Two times a day (BID) | ORAL | Status: DC
  Administered 2016-10-09 – 2016-10-10 (×2): 500 mg via ORAL
  Filled 2016-10-09 (×2): qty 1

## 2016-10-09 MED ORDER — OXCARBAZEPINE 300 MG PO TABS
150.0000 mg | ORAL_TABLET | Freq: Two times a day (BID) | ORAL | Status: DC
  Administered 2016-10-09 – 2016-10-10 (×2): 150 mg via ORAL
  Filled 2016-10-09 (×2): qty 1

## 2016-10-09 MED ORDER — ACETAMINOPHEN 325 MG PO TABS
650.0000 mg | ORAL_TABLET | ORAL | Status: DC | PRN
  Administered 2016-10-09: 650 mg via ORAL
  Filled 2016-10-09: qty 2

## 2016-10-09 MED ORDER — AMLODIPINE BESYLATE 5 MG PO TABS
10.0000 mg | ORAL_TABLET | Freq: Every day | ORAL | Status: DC
  Administered 2016-10-09 – 2016-10-10 (×2): 10 mg via ORAL
  Filled 2016-10-09 (×2): qty 2

## 2016-10-09 MED ORDER — LORAZEPAM 1 MG PO TABS
1.0000 mg | ORAL_TABLET | Freq: Three times a day (TID) | ORAL | Status: DC | PRN
  Administered 2016-10-09: 1 mg via ORAL
  Filled 2016-10-09: qty 1

## 2016-10-09 MED ORDER — BUPROPION HCL ER (XL) 150 MG PO TB24
300.0000 mg | ORAL_TABLET | Freq: Every day | ORAL | Status: DC
  Administered 2016-10-09 – 2016-10-10 (×2): 300 mg via ORAL
  Filled 2016-10-09 (×2): qty 2

## 2016-10-09 MED ORDER — ONDANSETRON HCL 4 MG PO TABS
4.0000 mg | ORAL_TABLET | Freq: Three times a day (TID) | ORAL | Status: DC | PRN
Start: 2016-10-09 — End: 2016-10-10

## 2016-10-09 NOTE — ED Notes (Signed)
No Dx of OSA noted on pt chart. Pt states she had sleep study done with [facility]". Pt believes her CPAP is set to a pressure of 15 cmH2O.

## 2016-10-09 NOTE — ED Notes (Signed)
Pt placed in bariatric gown, paper scrubs are too small for her

## 2016-10-09 NOTE — ED Notes (Addendum)
TTS device at bedside. Assessment underway.  

## 2016-10-09 NOTE — ED Notes (Addendum)
Pt reporting continued headache, 8/10. I offered pt the ordered tylenol, but she states that "it doesn't work" and asked if she could have ibuprofen. I explained that I would speak with the MD about obtaining an order for ibuprofen, but that I could not give her any at this time. Pt agreed to take tylenol now, hoping it would work with other meds to decr pain.   Informed Zammit, MD and obtained verbal order.

## 2016-10-09 NOTE — ED Triage Notes (Signed)
Pt presents with c/o suicidal thoughts. She reports a long history of these thoughts and can't remember a time when she didn't have them. This week she has thought of jumping off a bridge or taking too many pills, but did not attempt either action. She has not thought of hurting anyone else. She denies any substance use. She currently reports a headache. Her psychiatrist recently started her on medications for bipolar depression, but she did not feel they were working so she stopped taking them about 2 weeks ago. She was unable to get an appointment with her psychiatrist for 2 weeks so she came to ED for treatment

## 2016-10-09 NOTE — BH Assessment (Signed)
Spoke with Freida Busman, RN to set up the TTS cart. Freida Busman, RN requested 15 minutes in order to give the pt her medication and review her report. Will call back in 15 minutes.  Boyce Medici TTS Specialist
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
## 2016-10-09 NOTE — ED Notes (Signed)
Pt requested to take a shower before bedtime. Linens, gown, and toiletry supplies given to sitter, who escorted pt to ED shower room. Pt remains calm, cooperative, and in NAD.

## 2016-10-09 NOTE — ED Notes (Signed)
Retrieved pt's CPAP from cabinet in room. RT inspected and approved for use pt's CPAP device, hose and mask. This RN assembled, cleaned and filled CPAP device for pt use at bedside.

## 2016-10-09 NOTE — ED Notes (Signed)
Meal tray ordered 
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
## 2016-10-10 ENCOUNTER — Inpatient Hospital Stay (HOSPITAL_COMMUNITY)
Admission: AD | Admit: 2016-10-10 | Discharge: 2016-10-14 | DRG: 885 | Disposition: A | Discharge status: Home / Self Care | Payer: Medicaid Other | Source: Intra-hospital | Attending: Psychiatry | Admitting: Psychiatry

## 2016-10-10 ENCOUNTER — Encounter (HOSPITAL_COMMUNITY): Payer: Self-pay | Admitting: *Deleted

## 2016-10-10 ENCOUNTER — Encounter (HOSPITAL_COMMUNITY): Payer: Self-pay

## 2016-10-10 DIAGNOSIS — G4733 Obstructive sleep apnea (adult) (pediatric): Secondary | ICD-10-CM | POA: Diagnosis present

## 2016-10-10 DIAGNOSIS — E282 Polycystic ovarian syndrome: Secondary | ICD-10-CM

## 2016-10-10 DIAGNOSIS — Z818 Family history of other mental and behavioral disorders: Secondary | ICD-10-CM | POA: Diagnosis not present

## 2016-10-10 DIAGNOSIS — R45851 Suicidal ideations: Secondary | ICD-10-CM | POA: Diagnosis present

## 2016-10-10 DIAGNOSIS — F329 Major depressive disorder, single episode, unspecified: Secondary | ICD-10-CM | POA: Diagnosis present

## 2016-10-10 DIAGNOSIS — E119 Type 2 diabetes mellitus without complications: Secondary | ICD-10-CM | POA: Diagnosis present

## 2016-10-10 DIAGNOSIS — Z888 Allergy status to other drugs, medicaments and biological substances status: Secondary | ICD-10-CM | POA: Diagnosis not present

## 2016-10-10 DIAGNOSIS — Z79899 Other long term (current) drug therapy: Secondary | ICD-10-CM

## 2016-10-10 DIAGNOSIS — E039 Hypothyroidism, unspecified: Secondary | ICD-10-CM | POA: Diagnosis present

## 2016-10-10 DIAGNOSIS — I10 Essential (primary) hypertension: Secondary | ICD-10-CM

## 2016-10-10 DIAGNOSIS — F3160 Bipolar disorder, current episode mixed, unspecified: Principal | ICD-10-CM | POA: Diagnosis present

## 2016-10-10 DIAGNOSIS — Z7984 Long term (current) use of oral hypoglycemic drugs: Secondary | ICD-10-CM

## 2016-10-10 DIAGNOSIS — F32A Depression, unspecified: Secondary | ICD-10-CM | POA: Diagnosis present

## 2016-10-10 LAB — URINALYSIS, ROUTINE W REFLEX MICROSCOPIC
Bilirubin Urine: NEGATIVE
Glucose, UA: NEGATIVE mg/dL
Hgb urine dipstick: NEGATIVE
Ketones, ur: NEGATIVE mg/dL
Nitrite: NEGATIVE
PH: 5 (ref 5.0–8.0)
Protein, ur: NEGATIVE mg/dL
SPECIFIC GRAVITY, URINE: 1.012 (ref 1.005–1.030)

## 2016-10-10 MED ORDER — LORATADINE 10 MG PO TABS
10.0000 mg | ORAL_TABLET | Freq: Every day | ORAL | Status: DC
  Administered 2016-10-10: 10 mg via ORAL
  Filled 2016-10-10: qty 1

## 2016-10-10 MED ORDER — METFORMIN HCL 500 MG PO TABS: 500.0000 mg | ORAL_TABLET | Freq: Every day | ORAL | Status: DC

## 2016-10-10 MED ORDER — HYDROXYZINE HCL 25 MG PO TABS
25.0000 mg | ORAL_TABLET | Freq: Four times a day (QID) | ORAL | Status: DC | PRN
  Administered 2016-10-10 – 2016-10-13 (×3): 25 mg via ORAL
  Filled 2016-10-10 (×2): qty 1
  Filled 2016-10-10: qty 10
  Filled 2016-10-10: qty 1

## 2016-10-10 MED ORDER — HYDROCHLOROTHIAZIDE 12.5 MG PO CAPS
12.5000 mg | ORAL_CAPSULE | Freq: Every day | ORAL | Status: DC
  Filled 2016-10-10 (×3): qty 1

## 2016-10-10 MED ORDER — LORATADINE 10 MG PO TABS
10.0000 mg | ORAL_TABLET | Freq: Every day | ORAL | Status: DC
  Administered 2016-10-11 – 2016-10-14 (×4): 10 mg via ORAL
  Filled 2016-10-10 (×6): qty 1

## 2016-10-10 MED ORDER — METFORMIN HCL 500 MG PO TABS
500.0000 mg | ORAL_TABLET | Freq: Two times a day (BID) | ORAL | Status: DC
  Administered 2016-10-11 – 2016-10-14 (×7): 500 mg via ORAL
  Filled 2016-10-10 (×11): qty 1

## 2016-10-10 MED ORDER — ACETAMINOPHEN 325 MG PO TABS: 650.0000 mg | ORAL_TABLET | Freq: Four times a day (QID) | ORAL | Status: DC | PRN

## 2016-10-10 MED ORDER — LEVOTHYROXINE SODIUM 50 MCG PO TABS
50.0000 ug | ORAL_TABLET | Freq: Every day | ORAL | Status: DC
  Administered 2016-10-11 – 2016-10-14 (×4): 50 ug via ORAL
  Filled 2016-10-10: qty 1
  Filled 2016-10-10: qty 2
  Filled 2016-10-10 (×5): qty 1

## 2016-10-10 MED ORDER — IBUPROFEN 600 MG PO TABS
600.0000 mg | ORAL_TABLET | Freq: Four times a day (QID) | ORAL | Status: DC | PRN
Start: 2016-10-10 — End: 2016-10-14
  Administered 2016-10-10 (×2): 600 mg via ORAL
  Filled 2016-10-10 (×2): qty 1

## 2016-10-10 MED ORDER — TRAZODONE HCL 50 MG PO TABS
50.0000 mg | ORAL_TABLET | Freq: Every evening | ORAL | Status: DC | PRN
  Administered 2016-10-10 – 2016-10-13 (×3): 50 mg via ORAL
  Filled 2016-10-10: qty 7
  Filled 2016-10-10 (×3): qty 1

## 2016-10-10 MED ORDER — DOCUSATE SODIUM 100 MG PO CAPS
100.0000 mg | ORAL_CAPSULE | Freq: Two times a day (BID) | ORAL | Status: DC
  Administered 2016-10-10: 100 mg via ORAL
  Filled 2016-10-10: qty 1

## 2016-10-10 MED ORDER — PANTOPRAZOLE SODIUM 40 MG PO TBEC
40.0000 mg | DELAYED_RELEASE_TABLET | Freq: Every day | ORAL | Status: DC
  Administered 2016-10-11 – 2016-10-14 (×4): 40 mg via ORAL
  Filled 2016-10-10 (×4): qty 1
  Filled 2016-10-10: qty 7
  Filled 2016-10-10: qty 1

## 2016-10-10 MED ORDER — MAGNESIUM HYDROXIDE 400 MG/5ML PO SUSP
30.0000 mL | Freq: Every day | ORAL | Status: DC | PRN
  Administered 2016-10-11 – 2016-10-13 (×3): 30 mL via ORAL
  Filled 2016-10-10 (×3): qty 30

## 2016-10-10 MED ORDER — AMLODIPINE BESYLATE 10 MG PO TABS
10.0000 mg | ORAL_TABLET | Freq: Every day | ORAL | Status: DC
  Filled 2016-10-10 (×3): qty 1

## 2016-10-10 MED ORDER — ALUM & MAG HYDROXIDE-SIMETH 200-200-20 MG/5ML PO SUSP: 30.0000 mL | ORAL | Status: DC | PRN

## 2016-10-10 NOTE — Progress Notes (Signed)
D.  Pt pleasant on approach, denies complaints at this time.  Pt requested and received C-Pap that she brought from home.  Pt interacting appropriately within milieu.  Pt is new admission, came in today.  A.  Support and encouragement offered, medication given as ordered  R. Pt remains safe on the unit, will continue to monitor.
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
## 2016-10-10 NOTE — ED Notes (Addendum)
Pt requesting something for constipation. States her last BM was a couple days ago, but not sure.   Pt also reporting vaginal itching, discharge. States she often has recurrent UTIs or yeast infections, and doesn't believe she was evaluated for such when she came in.

## 2016-10-10 NOTE — ED Notes (Signed)
TTS being performed.  

## 2016-10-10 NOTE — ED Notes (Addendum)
Dr Johnney Killian aware pt accepted and being transported to [facility] via [facility] - pt in agreement w/tx plan. Advised no EMTALA needed.

## 2016-10-10 NOTE — ED Notes (Signed)
Woke pt to advise of need for urine specimen.
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
## 2016-10-10 NOTE — ED Notes (Signed)
Pt put on her own slip-on shoes, pants, and bra - wearing hospital gown. Offered for pt to put on her personal shirt - declined. States her clothing is dirty. Pt advised Sitter she did not have her rent money this month d/t spent money to get her car fixed after visiting a female friend in another area. States she is unemployed at this time and her brother did not send her the money he promised.

## 2016-10-10 NOTE — ED Notes (Addendum)
Pt requesting meds for constipation - states last BM was 2 days ago and med for allergies - Claritin. Pt also states she feels she may have either UTI or yeast infection - denies UTI symptoms - c/o "brown" vaginal d/c. States has hx bacterial vaginosis, Chlamydia, HPV, and UTI. Advised pt will notify EDP when arrives around 0900. Voiced understanding.

## 2016-10-10 NOTE — ED Notes (Signed)
Dr Johnney Killian aware of pt's requests - order received for u/a, Claritin 10 mg daily, and Colace 100mg  bid - readback for verification - Pt can follow up w/PCP or health department for vaginal issues.

## 2016-10-10 NOTE — ED Notes (Signed)
ALL belongings - 1 labeled belongings bag, 1 black duffel bag w/CPAP machine and sterile water, and 1 valuables envelope - given to [facility]. Pt aware.

## 2016-10-10 NOTE — ED Notes (Signed)
Pt accepted to [facility] 405-2 - Lindon Romp, NP, and Dr Parke Poisson - Per Oletha Blend, [facility] - may arrive after 1230.

## 2016-10-10 NOTE — ED Notes (Signed)
Pt voiced understanding and agreement w/being accepted to [facility] - signed consent forms.

## 2016-10-11 DIAGNOSIS — Z818 Family history of other mental and behavioral disorders: Secondary | ICD-10-CM

## 2016-10-11 DIAGNOSIS — F3160 Bipolar disorder, current episode mixed, unspecified: Secondary | ICD-10-CM | POA: Diagnosis present

## 2016-10-11 DIAGNOSIS — R45851 Suicidal ideations: Secondary | ICD-10-CM

## 2016-10-11 DIAGNOSIS — Z79899 Other long term (current) drug therapy: Secondary | ICD-10-CM

## 2016-10-11 LAB — TSH: TSH: 3.635 u[IU]/mL (ref 0.350–4.500)

## 2016-10-11 LAB — PREGNANCY, URINE: PREG TEST UR: NEGATIVE

## 2016-10-11 MED ORDER — VENLAFAXINE HCL ER 37.5 MG PO CP24
37.5000 mg | ORAL_CAPSULE | Freq: Every day | ORAL | Status: DC
  Administered 2016-10-11 – 2016-10-14 (×4): 37.5 mg via ORAL
  Filled 2016-10-11 (×6): qty 1

## 2016-10-11 MED ORDER — VENLAFAXINE HCL 37.5 MG PO TABS: 37.5000 mg | ORAL_TABLET | Freq: Every day | ORAL | Status: DC

## 2016-10-11 MED ORDER — ARIPIPRAZOLE 10 MG PO TABS
10.0000 mg | ORAL_TABLET | Freq: Every day | ORAL | Status: DC
  Administered 2016-10-11 – 2016-10-14 (×4): 10 mg via ORAL
  Filled 2016-10-11 (×4): qty 1
  Filled 2016-10-11: qty 7
  Filled 2016-10-11 (×4): qty 1

## 2016-10-11 NOTE — Progress Notes (Signed)
Renee Steele has adjusted to being in the hospital...joining her peers in the dayroom. She is bright and cooperative upon approach. Her speech and thought pattern does ramble, at times. A She completed her daily assessment and on this she wrote she rates her depression, hopelessness and anxeity "8/0/7", respectively. She denied experiencing SI today. Ekg was performed and placed on chart. UA sent for analysis per MD order. Pt  Scheduled for labwork and to begin abilify later this evening. R Safeyt in place and pt offered support and encouragement.

## 2016-10-11 NOTE — H&P (Signed)
Psychiatric Admission Assessment Adult  Patient Identification: Renee Steele MRN:  [id number] Date of Evaluation:  10/11/2016 Chief Complaint:  Bipolar Disorder, Most recent episode Depressed Principal Diagnosis: Depression Diagnosis:   Patient Active Problem List   Diagnosis Date Noted  . Depression [F32.9] 10/10/2016  . Morbid obesity ([place]) [E66.01] 02/24/2015  . Hypothyroidism [E03.9] 02/24/2015  . PCOS (polycystic ovarian syndrome) [E28.2] 02/24/2015  . Diabetes ([place]) [E11.9] 02/24/2015  . Bipolar 1 disorder, depressed, severe ([place]) [F31.4] 11/24/2014   History of Present Illness:PerTele Assessment Note- Renee Steele is an 36 y.o. female who presents to the ED voluntarily due to increased suicidal thoughts. Pt reports she was diagnosed with Bipolar d/o "a long time ago" and states she has been depressed ever since. Pt reports she has a gambling addiction and almost got evicted last month due to spending her social security income on gambling. Pt reports she feels sad all the time and has thoughts that she "might as well end it all." Pt also reports increased risky behaviors including meeting men online and having unprotected sex with them the same day that she meets them. Pt reports some paranoid thoughts and "thoughts that people are trying to hurt her." Pt reports she has searched "how to kill yourself" online.  Pt reports she is followed by [facility] and has a hx of inpt hospitalizations due to SI and Bipolar d/o. Pt describes her sleep patterns as "erratic" in which she sometimes sleeps for more than 10 hours and does not want to get out of bed and other times she will have insomnia and not be able to fall asleep. Pt reports she has an eating D/O and reports she "eats until she makes herself sick." Pt reports this weight gain sometimes causes pain and makes it difficult to stand for long periods of time. Pt reports she is from [place] and her support system is  up [place]. Pt reports she does not feel that she has a support system in [place]. Pt reports she has not been showering, has no motivation to get out if bed, and has constant thoughts of "different ways to end it all." Pt denies drug and alcohol use and denies HI. Pt denies AVH and reports she feels if she does not receive inpt treatment she will continue to be a danger to herself   Associated Signs/Symptoms: Depression Symptoms:  depressed mood, suicidal thoughts with specific plan, anxiety, (Hypo) Manic Symptoms:  Distractibility, Irritable Mood, Anxiety Symptoms:  Excessive Worry, Psychotic Symptoms:  Hallucinations: None PTSD Symptoms: Avoidance:  Decreased Interest/Participation Foreshortened Future Total Time spent with patient: 30 minutes  Past Psychiatric History:   Is the patient at risk to self? Yes.    Has the patient been a risk to self in the past 6 months? Yes.    Has the patient been a risk to self within the distant past? Yes.    Is the patient a risk to others? No.  Has the patient been a risk to others in the past 6 months? No.  Has the patient been a risk to others within the distant past? No.   Prior Inpatient Therapy:   Prior Outpatient Therapy:    Alcohol Screening: 1. How often do you have a drink containing alcohol?: Never 9. Have you or someone else been injured as a result of your drinking?: No 10. Has a relative or friend or a doctor or another health worker been concerned about your drinking or suggested you cut  down?: No Alcohol Use Disorder Identification Test Final Score (AUDIT): 0 Brief Intervention: AUDIT score less than 7 or less-screening does not suggest unhealthy drinking-brief intervention not indicated Substance Abuse History in the last 12 months:  No. Consequences of Substance Abuse: Withdrawal Symptoms:   None Previous Psychotropic Medications: YES Psychological Evaluations: YES Past Medical History:  Past Medical History:  Diagnosis Date   . Bacterial vaginosis   . Chlamydia   . Depression   . Diabetes mellitus without complication ([place])   . HPV in female   . Hypertension   . Hypothyroidism   . Morbid obesity ([place])   . OSA on CPAP   . Post traumatic stress disorder (PTSD)   . Urinary tract infection     Past Surgical History:  Procedure Laterality Date  . TONSILLECTOMY    . WISDOM TOOTH EXTRACTION     Family History:  Family History  Problem Relation Age of Onset  . Bipolar disorder Father   . Bipolar disorder Mother    Family Psychiatric  History: uncle and cousin committed suicide  Tobacco Screening: Have you used any form of tobacco in the last 30 days? (Cigarettes, Smokeless Tobacco, Cigars, and/or Pipes): No Social History:  History  Alcohol Use  . Yes     History  Drug Use No    Additional Social History:      Pain Medications: See PTA meds  Prescriptions: See PTA meds  Over the Counter: See PTA meds  History of alcohol / drug use?: No history of alcohol / drug abuse Longest period of sobriety (when/how long): N/A                    Allergies:   Allergies  Allergen Reactions  . Lisinopril Shortness Of Breath and Cough   Lab Results:  Results for orders placed or performed during the hospital encounter of 10/10/16 (from the past 48 hour(s))  TSH     Status: None   Collection Time: 10/11/16  6:26 AM  Result Value Ref Range   TSH 3.635 0.350 - 4.500 uIU/mL    Comment: Performed by a 3rd Generation assay with a functional sensitivity of <=0.01 uIU/mL. Performed at [facility], [place] [address] [place], [place] [address]     Blood Alcohol level:  Lab Results  Component Value Date   [facility] <5 10/09/2016   ETH <5 05/08/1593    Metabolic Disorder Labs:  Lab Results  Component Value Date   HGBA1C 5.1 08/13/2016   MPG 111 11/25/2014   No results found for: PROLACTIN Lab Results  Component Value Date   CHOL 201 (H) 11/24/2014   TRIG 181 (H) 11/24/2014    HDL 37 (L) 11/24/2014   CHOLHDL 5.4 11/24/2014   VLDL 36 11/24/2014   LDLCALC 128 (H) 11/24/2014    Current Medications: Current Facility-Administered Medications  Medication Dose Route Frequency Provider Last Rate Last Dose  . acetaminophen (TYLENOL) tablet 650 mg  650 mg Oral Q6H PRN Kerrie Buffalo, NP      . alum & mag hydroxide-simeth (MAALOX/MYLANTA) 200-200-20 MG/5ML suspension 30 mL  30 mL Oral Q4H PRN Kerrie Buffalo, NP      . amLODipine (NORVASC) tablet 10 mg  10 mg Oral Daily Kerrie Buffalo, NP      . hydrochlorothiazide (MICROZIDE) capsule 12.5 mg  12.5 mg Oral Daily Kerrie Buffalo, NP      . hydrOXYzine (ATARAX/VISTARIL) tablet 25 mg  25 mg Oral Q6H PRN Kerrie Buffalo,  NP   25 mg at 10/10/16 2128  . ibuprofen (ADVIL,MOTRIN) tablet 600 mg  600 mg Oral Q6H PRN Kerrie Buffalo, NP   600 mg at 10/10/16 2128  . levothyroxine (SYNTHROID, LEVOTHROID) tablet 50 mcg  50 mcg Oral QAC breakfast Kerrie Buffalo, NP   50 mcg at 10/11/16 0617  . loratadine (CLARITIN) tablet 10 mg  10 mg Oral Daily Kerrie Buffalo, NP   10 mg at 10/11/16 0734  . magnesium hydroxide (MILK OF MAGNESIA) suspension 30 mL  30 mL Oral Daily PRN Kerrie Buffalo, NP      . metFORMIN (GLUCOPHAGE) tablet 500 mg  500 mg Oral BID WC Kerrie Buffalo, NP   500 mg at 10/11/16 0734  . pantoprazole (PROTONIX) EC tablet 40 mg  40 mg Oral Daily Kerrie Buffalo, NP   40 mg at 10/11/16 0734  . traZODone (DESYREL) tablet 50 mg  50 mg Oral QHS PRN Kerrie Buffalo, NP   50 mg at 10/10/16 2128   PTA Medications: Prescriptions Prior to Admission  Medication Sig Dispense Refill Last Dose  . amLODipine (NORVASC) 10 MG tablet Take 10 mg by mouth daily.   Not Taking at Unknown time  . buPROPion (WELLBUTRIN XL) 300 MG 24 hr tablet Take 1 tablet (300 mg total) by mouth daily. (Patient not taking: Reported on 10/09/2016) 30 tablet 0 Not Taking at Unknown time  . hydrochlorothiazide (HYDRODIURIL) 12.5 MG tablet Take 1 tablet (12.5 mg total) by mouth  daily. 30 tablet 2 10/09/2016 at Unknown time  . ibuprofen (ADVIL,MOTRIN) 200 MG tablet Take 200 mg by mouth every 6 (six) hours as needed for moderate pain.    10/09/2016 at Unknown time  . levothyroxine (SYNTHROID, LEVOTHROID) 50 MCG tablet Take 1 tablet (50 mcg total) by mouth daily. (Patient not taking: Reported on 10/09/2016) 30 tablet 1 Not Taking at Unknown time  . loratadine (CLARITIN) 10 MG tablet Take 1 tablet (10 mg total) by mouth daily. (Patient not taking: Reported on 10/09/2016) 30 tablet 0 Not Taking at Unknown time  . metFORMIN (GLUCOPHAGE) 500 MG tablet Take 1 tablet (500 mg total) by mouth 2 (two) times daily with a meal. (Patient not taking: Reported on 10/09/2016) 60 tablet 2 Not Taking at Unknown time  . metroNIDAZOLE (METROGEL VAGINAL) 0.75 % vaginal gel Place 1 Applicatorful vaginally at bedtime. For 7 nights 70 g 0 08/06/2016  . Multiple Vitamin (MULTIVITAMIN WITH MINERALS) TABS tablet Take 1 tablet by mouth daily.   Past Week at Unknown time  . omeprazole (PRILOSEC) 40 MG capsule Take 1 capsule (40 mg total) by mouth daily. (Patient not taking: Reported on 10/09/2016) 30 capsule 1 Not Taking at Unknown time  . OXcarbazepine (TRILEPTAL) 150 MG tablet Take 150 mg by mouth 2 (two) times daily.    Not Taking at Unknown time  . polyethylene glycol (MIRALAX) packet Take 17 g by mouth daily. (Patient taking differently: Take 17 g by mouth daily as needed for mild constipation. ) 30 packet 1 Past Month at Unknown time    Musculoskeletal: Strength & Muscle Tone: within normal limits Gait & Station: normal Patient leans: N/A  Psychiatric Specialty Exam: Physical Exam  Nursing note and vitals reviewed. Constitutional: She is oriented to person, place, and time.  Neurological: She is oriented to person, place, and time.  Skin: Skin is warm and dry.    Review of Systems  Psychiatric/Behavioral: Positive for depression and suicidal ideas. The patient is nervous/anxious.     Blood pressure  Marland Kitchen)  93/53, pulse (!) 108, temperature 98.4 F (36.9 C), temperature source Oral, resp. rate 16, height 5' 8"  (1.727 m), weight (!) 186.9 kg (412 lb), SpO2 98 %.Body mass index is 62.64 kg/m.  General Appearance: Disheveled  Eye Contact:  Good  Speech:  Clear and Coherent  Volume:  Normal  Mood:  Anxious and Depressed  Affect:  Depressed and Flat  Thought Process:  Coherent and Descriptions of Associations: Circumstantial  Orientation:  Full (Time, Place, and Person)  Thought Content:  Hallucinations: None  Suicidal Thoughts:  Yes.  with intent/plan  Homicidal Thoughts:  No  Memory:  Immediate;   Fair Recent;   Fair Remote;   Fair  Judgement:  Fair  Insight:  Fair  Psychomotor Activity:  Restlessness  Concentration:  Concentration: Fair  Recall:  [employer] of Knowledge:  Fair  Language:  Fair  Akathisia:  No  Handed:  Right  AIMS (if indicated):     Assets:  Communication Skills Desire for Improvement Resilience Social Support  ADL's:  Intact  Cognition:  WNL  Sleep:  Number of Hours: 6.5     I agree with current treatment plan on 10/11/2016, Patient seen face-to-face for psychiatric evaluation follow-up, chart reviewed and case discussed with the MD.Rily Nickey. Reviewed the information documented and agree with the treatment plan.  Treatment Plan Summary: Daily contact with patient to assess and evaluate symptoms and progress in treatment and Medication management  Started  Effexor 37.70m  for mood stabilization. Continue with Trazodone 50 mg for insomnia  Will continue to monitor vitals ,medication compliance and treatment side effects while patient is here.  Reviewed labs,BAL - , UDS -  Patient to participate in therapeutic milieu   Observation Level/Precautions:  15 minute checks  Laboratory:  CBC Chemistry Profile UDS UA  Psychotherapy:  Individual and group session   Medications:  See above  Consultations:  Psychiatry  Discharge Concerns:  Safety,  stabilization, and risk of access to medication and medication stabilization   Estimated LOS:5-7day  Other:     Physician Treatment Plan for Primary Diagnosis: Depression Long Term Goal(s): Improvement in symptoms so as ready for discharge  Short Term Goals: Ability to identify changes in lifestyle to reduce recurrence of condition will improve, Ability to verbalize feelings will improve and Compliance with prescribed medications will improve  Physician Treatment Plan for Secondary Diagnosis: Principal Problem:   Depression  Long Term Goal(s): Improvement in symptoms so as ready for discharge  Short Term Goals: Ability to verbalize feelings will improve, Ability to demonstrate self-control will improve and Ability to maintain clinical measurements within normal limits will improve  I certify that inpatient services furnished can reasonably be expected to improve the patient's condition.    [place]ill [place] NP 4/8/201810:05 AM   I have reviewed case with NP and have met with patient Agree with NP note Patient is a 89year old female, presented voluntarily to ED on 4/6 reporting suicidal ideations, with thoughts of overdosing or jumping off a bridge . She states she has been diagnosed with Bipolar Disorder, and feels that her mood has been more labile recently. Reports symptoms of mania- these include increased pursuit of pleasurable but reckless and potentially dangerous behaviors ( overspending, gambling , unprotected sex with strangers ) , poor sleep. Also endorses some symptoms of depression, such as recent suicidal ideations and sense of sadness. Denies drug or alcohol abuse and admission BAL < 5, UDS negative . Reports non compliance with medications .  She expresses concern about medications causing weight gain. She has had a prior psychiatric admission at [facility] in 2016-for depression. At the time stabilized and discharged on Latuda/ Lamictal/ Wellbutrin Dx- Bipolar Disorder , Mixed   Plan- Inpatient admission.  We have discussed options. Patient agrees to Abilify ( start 10 mgrs QDAY)  . Also start Effexor XR 37.5 mgrs initially . Patient is requesting STD work up. Will also order HgbA1C, Prolactin, Lipid Panel- routine as treating with antipsychotic medication.

## 2016-10-11 NOTE — Progress Notes (Deleted)
[facility] 1:1 Observation Documentation  For the first (8) hours following discontinuation of 1:1 precautions, a progress note entry by nursing staff should be documented at least every 2 hours, reflecting the patient's behavior, condition, mood, and conversation.  Use the progress notes for additional entries.  Time 1:1 discontinued1700:    Patient's Behavior:  Clam and concerned  Patient's Condition:  stable  Patient's Conversation:    Lauralyn Primes 10/11/2016, 2:31 PM

## 2016-10-11 NOTE — [facility] Group Notes (Signed)
  Healthy Support Systems Date:  10/11/2016  Time:  1300  Type of Therapy:  Nurse Education  /  [place] The group focuses on teaching patients how to develop healthy support systems and then how t uitllize them to maintain their health in recovery.  Participation Level:  Active  Participation Quality:  Attentive  Affect:  Appropriate  Cognitive:  Alert  Insight:  Improving  Engagement in Group:  Engaged  Modes of Intervention:  Education  Summary of Progress/Problems:  Lauralyn Primes 10/11/2016, 4:37 PM

## 2016-10-11 NOTE — [facility] Suicide Risk Assessment (Addendum)
[facility] Admission Suicide Risk Assessment   Nursing information obtained from:   patient and chart  Demographic factors:   36 year old female  Current Mental Status:   see below Loss Factors:   financial difficulties  Historical Factors:   reports history of bipolarity , prior psychiatric admission Risk Reduction Factors:   resilience, physical health  Total Time spent with patient: 45 minutes Principal Problem: Depression Diagnosis:   Patient Active Problem List   Diagnosis Date Noted  . Depression [F32.9] 10/10/2016  . Morbid obesity ([place]) [E66.01] 02/24/2015  . Hypothyroidism [E03.9] 02/24/2015  . PCOS (polycystic ovarian syndrome) [E28.2] 02/24/2015  . Diabetes ([place]) [E11.9] 02/24/2015  . Bipolar 1 disorder, depressed, severe ([place]) [F31.4] 11/24/2014    Continued Clinical Symptoms:  Alcohol Use Disorder Identification Test Final Score (AUDIT): 0 The "Alcohol Use Disorders Identification Test", Guidelines for Use in Primary Care, Second Edition.  World Pharmacologist [facility]). Score between 0-7:  no or low risk or alcohol related problems. Score between 8-15:  moderate risk of alcohol related problems. Score between 16-19:  high risk of alcohol related problems. Score 20 or above:  warrants further diagnostic evaluation for alcohol dependence and treatment.   CLINICAL FACTORS:  Patient is a 36 year old female, presented voluntarily to ED on 4/6 reporting suicidal ideations, with thoughts of overdosing or jumping off a bridge . She states she has been diagnosed with Bipolar Disorder, and feels that her mood has been more labile recently. Reports symptoms of mania- these include increased pursuit of pleasurable but reckless and potentially dangerous behaviors ( overspending, gambling , unprotected sex with strangers ) , poor sleep. Also endorses some symptoms of depression, such as recent suicidal ideations and sense of sadness. Denies drug or alcohol abuse and admission BAL < 5,  UDS negative . Reports non compliance with medications . She expresses concern about medications causing weight gain. She has had a prior psychiatric admission at [facility] in 2016-for depression. At the time stabilized and discharged on Latuda/ Lamictal/ Wellbutrin Dx- Bipolar Disorder , Mixed  Plan- Inpatient admission.  We have discussed options. Patient agrees to Abilify ( start 10 mgrs QDAY)  . Also start Effexor XR 37.5 mgrs initially . Patient is requesting STD work up. Will also order HgbA1C, Prolactin, Lipid Panel- routine as treating with antipsychotic medication.    Musculoskeletal: Strength & Muscle Tone: within normal limits Gait & Station: normal Patient leans: N/A  Psychiatric Specialty Exam: Physical Exam  ROS denies headache, no chest pain, no shortness of breath, no vomiting   Blood pressure (!) 93/53, pulse (!) 108, temperature 98.4 F (36.9 C), temperature source Oral, resp. rate 16, height 5\' 8"  (1.727 m), weight (!) 186.9 kg (412 lb), SpO2 98 %.Body mass index is 62.64 kg/m.  General Appearance: Fairly Groomed  Eye Contact:  Good  Speech:  Normal Rate  Volume:  Normal  Mood:  reports symptoms of mania , as above, but states mood has been depressed   Affect:  Labile  Thought Process:  Linear and Descriptions of Associations: Intact  Orientation:  Full (Time, Place, and Person)  Thought Content:  denies hallucinations, no delusions , not internally preoccupied   Suicidal Thoughts:  No today denies any active suicidal ideations, and contracts for safety on the unit   Homicidal Thoughts:  No denies any homicidal or violent ideations   Memory:  recent and remote grossly intact   Judgement:  Fair  Insight:  Fair  Psychomotor Activity:  Normal  Concentration:  Concentration: Good and Attention Span: Good  Recall:  Good  Fund of Knowledge:  Good  Language:  Good  Akathisia:  Negative  Handed:  Right  AIMS (if indicated):     Assets:  Communication Skills Desire for  Improvement Resilience  ADL's:  Intact  Cognition:  WNL  Sleep:  Number of Hours: 6.5      COGNITIVE FEATURES THAT CONTRIBUTE TO RISK:  Closed-mindedness and Loss of executive function    SUICIDE RISK:   Moderate:  Frequent suicidal ideation with limited intensity, and duration, some specificity in terms of plans, no associated intent, good self-control, limited dysphoria/symptomatology, some risk factors present, and identifiable protective factors, including available and accessible social support.  PLAN OF CARE: Patient will be admitted to inpatient psychiatric unit for stabilization and safety. Will provide and encourage milieu participation. Provide medication management and maked adjustments as needed.  Will follow daily.    I certify that inpatient services furnished can reasonably be expected to improve the patient's condition.   Jenne Campus, MD 10/11/2016, 4:08 PM
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
## 2016-10-11 NOTE — Progress Notes (Signed)
D.  Pt pleasant on approach, denies complaints at this time.  Pt asked if she was to start [employer], states she and Dr. Parke Poisson discussed this.  Pt was surprised to learn she was to be started on Abilify.  Pt asked if this was a mood stabilizer.  Pt states she will speak to the doctor about this tomorrow for clarification but was agreeable to take ordered medication as it is.  Pt denies SI/HI/hallucinations at this time.  A.  Support and encouragement offered, medication given as ordered  R.  Pt remains safe on the unit, will continue to monitor.

## 2016-10-12 LAB — LIPID PANEL
CHOL/HDL RATIO: 5.1 ratio
CHOLESTEROL: 172 mg/dL (ref 0–200)
HDL: 34 mg/dL — ABNORMAL LOW (ref >40)
LDL Cholesterol: 88 mg/dL (ref 0–99)
TRIGLYCERIDES: 249 mg/dL — AB (ref <150)
VLDL: 50 mg/dL — AB (ref 0–40)

## 2016-10-12 LAB — TSH: TSH: 1.275 u[IU]/mL (ref 0.350–4.500)

## 2016-10-12 MED ORDER — AMLODIPINE BESYLATE 5 MG PO TABS
5.0000 mg | ORAL_TABLET | Freq: Every day | ORAL | Status: DC
  Filled 2016-10-12 (×4): qty 1

## 2016-10-12 NOTE — Progress Notes (Signed)
Patient's BP standing was low this morning.  Norvasc and HCTZ not given this morning.  MD/NP informed.

## 2016-10-12 NOTE — Progress Notes (Signed)
D:  Patient's self inventory sheet, patient has poor sleep, sleep medication given.  Poor appetite, low energy level, poor concentration.  Rated depression 9, hopeless 2.  Denied anxiety.  Denied withdrawals.  Denied SI.  Physical problems, lightheaded, low BP.  Physical pain, stomach.  No pain medication.  Goal is attitude.  Does have discharge plans. A:  Medications held because of low BP are Norvasc and HCTZ.  NP/MD informed.  Encouragement and support given patient. R:  Denied SI and HI, contracts for safety.  Safety maintained with 15 minute checks.

## 2016-10-12 NOTE — Social Work (Signed)
Referred to [facility], is [place] resident.  Bryar Rennie, LCSW Lead Clinical Social Worker Phone:  [phone]  

## 2016-10-12 NOTE — Progress Notes (Signed)
Recreation Therapy Notes  Date: 10/12/16 Time: 0930 Location: 300 [address] Dayroom  Group Topic: Stress Management  Goal Area(s) Addresses:  Patient will verbalize importance of using healthy stress management.  Patient will identify positive emotions associated with healthy stress management.   Intervention: Stress Management  Activity :  Guided Imagery.  LRT introduced the stress management technique of guided imagery.  LRT read a script to allow patients to participate and engage in the technique.  Patients were to follow along as the script was read to engage in the activity.  Education:  Stress Management, Discharge Planning.   Education Outcome: Acknowledges edcuation/In group clarification offered/Needs additional education  Clinical Observations/Feedback: Pt did not attend group.   Victorino Sparrow, LRT/CTRS         Ria Comment, Naijah Lacek A 10/12/2016 11:08 AM

## 2016-10-12 NOTE — Progress Notes (Signed)
Pt has been in the dayroom all evening watching TV and also attended evening wrap up group.  Pt stated to this writer that she was doing "ok" and voiced no needs or concerns.  She did ask for gatorade aid, and d/t her low BP during the day, pt was given the gatorade aid and encouraged to notify staff if she continued to feel dizzy.  She has been pleasant and cooperative with staff.  Pt denies SI/HI/AVH at this time.  Support and encouragement offered.  Discharge plans are in process.  Safety maintained with q15 minute checks.

## 2016-10-12 NOTE — BHH Group Notes (Signed)
[place] LCSW Group Therapy  10/12/2016 1:15pm  Type of Therapy: Group Therapy   Topic: Overcoming Obstacles  Participation Level: Pt invited. Did not attend.  Georga Kaufmann, MSW, [place]

## 2016-10-12 NOTE — Plan of Care (Signed)
Problem: Health Behavior/Discharge Planning: Goal: Compliance with treatment plan for underlying cause of condition will improve Outcome: Progressing Pt asked appropriate questions about her medication regimen
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
## 2016-10-12 NOTE — Plan of Care (Signed)
Problem: Education: Goal: Utilization of techniques to improve thought processes will improve Outcome: Progressing Nurse discussed depression/coping skills with patient.    

## 2016-10-12 NOTE — Progress Notes (Signed)
[facility] MD Progress Note  10/12/2016 6:19 PM Renee Steele  MRN:  [id number] Subjective: patient reports ongoing depression. She denies any suicidal ideations today. She felt dizzy and lightheaded earlier today, and was hypotensive ( patient reports history of HTN, and on admission was on Norvasc, HCTZ). Objective : I have discussed case with treatment team and have met with patient. Patient reports ongoing depression, and presents with a constricted , rather blunted affect, but at times briefly laughs. Denies hallucinations, no delusions expressed. Denies current suicidal ideations and contracts for safety on unit. As above, episode of dizziness , hypotension earlier today, now improved. At this time denies feeling dizzy and gait is steady. No disruptive or agitated behaviors on unit, limited milieu/ group  participation at present.   Principal Problem: Mixed bipolar I disorder ([place]) Diagnosis:   Patient Active Problem List   Diagnosis Date Noted  . Mixed bipolar I disorder ([place]) [F31.60]   . Depression [F32.9] 10/10/2016  . Morbid obesity ([place]) [E66.01] 02/24/2015  . Hypothyroidism [E03.9] 02/24/2015  . PCOS (polycystic ovarian syndrome) [E28.2] 02/24/2015  . Diabetes ([place]) [E11.9] 02/24/2015  . Bipolar 1 disorder, depressed, severe ([place]) [F31.4] 11/24/2014   Total Time spent with patient: 20 minutes   Past Medical History:  Past Medical History:  Diagnosis Date  . Bacterial vaginosis   . Chlamydia   . Depression   . Diabetes mellitus without complication ([address])   . HPV in female   . Hypertension   . Hypothyroidism   . Morbid obesity ([place])   . OSA on CPAP   . Post traumatic stress disorder (PTSD)   . Urinary tract infection     Past Surgical History:  Procedure Laterality Date  . TONSILLECTOMY    . WISDOM TOOTH EXTRACTION     Family History:  Family History  Problem Relation Age of Onset  . Bipolar disorder Father   . Bipolar disorder Mother    Social History:   History  Alcohol Use  . Yes     History  Drug Use No    Social History   Social History  . Marital status: Single    Spouse name: N/A  . Number of children: N/A  . Years of education: N/A   Social History Main Topics  . Smoking status: Never Smoker  . Smokeless tobacco: Never Used  . Alcohol use Yes  . Drug use: No  . Sexual activity: Not Asked   Other Topics Concern  . None   Social History Narrative  . None   Additional Social History:    Pain Medications: See PTA meds  Prescriptions: See PTA meds  Over the Counter: See PTA meds  History of alcohol / drug use?: No history of alcohol / drug abuse Longest period of sobriety (when/how long): N/A  Sleep: Fair  Appetite:  Fair  Current Medications: Current Facility-Administered Medications  Medication Dose Route Frequency Provider Last Rate Last Dose  . acetaminophen (TYLENOL) tablet 650 mg  650 mg Oral Q6H PRN Kerrie Buffalo, NP      . alum & mag hydroxide-simeth (MAALOX/MYLANTA) 200-200-20 MG/5ML suspension 30 mL  30 mL Oral Q4H PRN Kerrie Buffalo, NP      . Derrill Memo ON 10/13/2016] amLODipine (NORVASC) tablet 5 mg  5 mg Oral Daily Myer Peer Cobos, MD      . ARIPiprazole (ABILIFY) tablet 10 mg  10 mg Oral Daily Jenne Campus, MD   10 mg at 10/12/16 0834  . hydrOXYzine (ATARAX/VISTARIL) tablet  25 mg  25 mg Oral Q6H PRN Kerrie Buffalo, NP   25 mg at 10/11/16 2103  . ibuprofen (ADVIL,MOTRIN) tablet 600 mg  600 mg Oral Q6H PRN Kerrie Buffalo, NP   600 mg at 10/10/16 2128  . levothyroxine (SYNTHROID, LEVOTHROID) tablet 50 mcg  50 mcg Oral QAC breakfast Kerrie Buffalo, NP   50 mcg at 10/12/16 0608  . loratadine (CLARITIN) tablet 10 mg  10 mg Oral Daily Kerrie Buffalo, NP   10 mg at 10/12/16 0834  . magnesium hydroxide (MILK OF MAGNESIA) suspension 30 mL  30 mL Oral Daily PRN Kerrie Buffalo, NP   30 mL at 10/11/16 1827  . metFORMIN (GLUCOPHAGE) tablet 500 mg  500 mg Oral BID WC Kerrie Buffalo, NP   500 mg at 10/12/16  1719  . pantoprazole (PROTONIX) EC tablet 40 mg  40 mg Oral Daily Kerrie Buffalo, NP   40 mg at 10/12/16 0834  . traZODone (DESYREL) tablet 50 mg  50 mg Oral QHS PRN Kerrie Buffalo, NP   50 mg at 10/11/16 2103  . venlafaxine XR (EFFEXOR-XR) 24 hr capsule 37.5 mg  37.5 mg Oral Q breakfast Jenne Campus, MD   37.5 mg at 10/12/16 3428    Lab Results:  Results for orders placed or performed during the hospital encounter of 10/10/16 (from the past 48 hour(s))  TSH     Status: None   Collection Time: 10/11/16  6:26 AM  Result Value Ref Range   TSH 3.635 0.350 - 4.500 uIU/mL    Comment: Performed by a 3rd Generation assay with a functional sensitivity of <=0.01 uIU/mL. Performed at [facility], [place] [address], [place], [place] [address]   Pregnancy, urine     Status: None   Collection Time: 10/11/16  3:00 PM  Result Value Ref Range   Preg Test, Ur NEGATIVE NEGATIVE    Comment:        THE SENSITIVITY OF THIS METHODOLOGY IS >20 mIU/mL. Performed at [facility], [place] [address], [place], [place] [address]   Lipid panel     Status: Abnormal   Collection Time: 10/12/16  6:09 AM  Result Value Ref Range   Cholesterol 172 0 - 200 mg/dL   Triglycerides 249 (H) <150 mg/dL   HDL 34 (L) >40 mg/dL   Total CHOL/HDL Ratio 5.1 RATIO   VLDL 50 (H) 0 - 40 mg/dL   LDL Cholesterol 88 0 - 99 mg/dL    Comment:        Total Cholesterol/HDL:CHD Risk Coronary Heart Disease Risk Table                     Men   Women  1/2 Average Risk   3.4   3.3  Average Risk       5.0   4.4  2 X Average Risk   9.6   7.1  3 X Average Risk  23.4   11.0        Use the calculated Patient Ratio above and the CHD Risk Table to determine the patient's CHD Risk.        ATP III CLASSIFICATION (LDL):  <100     mg/dL   Optimal  100-129  mg/dL   Near or Above                    Optimal  130-159  mg/dL   Borderline  160-189  mg/dL   High  >  190     mg/dL   Very High Performed at [facility], [place] [address] [place], [place] [address]   TSH     Status: None   Collection Time: 10/12/16  6:09 AM  Result Value Ref Range   TSH 1.275 0.350 - 4.500 uIU/mL    Comment: Performed by a 3rd Generation assay with a functional sensitivity of <=0.01 uIU/mL. Performed at [facility], [place] [address], [place], [place] [address]     Blood Alcohol level:  Lab Results  Component Value Date   [facility] <5 10/09/2016   ETH <5 02/63/7858    Metabolic Disorder Labs: Lab Results  Component Value Date   HGBA1C 5.1 08/13/2016   MPG 111 11/25/2014   No results found for: PROLACTIN Lab Results  Component Value Date   CHOL 172 10/12/2016   TRIG 249 (H) 10/12/2016   HDL 34 (L) 10/12/2016   CHOLHDL 5.1 10/12/2016   VLDL 50 (H) 10/12/2016   LDLCALC 88 10/12/2016   LDLCALC 128 (H) 11/24/2014    Physical Findings: AIMS: Facial and Oral Movements Muscles of Facial Expression: None, normal Lips and Perioral Area: None, normal Jaw: None, normal Tongue: None, normal,Extremity Movements Upper (arms, wrists, hands, fingers): None, normal Lower (legs, knees, ankles, toes): None, normal, Trunk Movements Neck, shoulders, hips: None, normal, Overall Severity Severity of abnormal movements (highest score from questions above): None, normal Incapacitation due to abnormal movements: None, normal Patient's awareness of abnormal movements (rate only patient's report): No Awareness, Dental Status Current problems with teeth and/or dentures?: No Does patient usually wear dentures?: No  CIWA:  CIWA-Ar Total: 1 COWS:  COWS Total Score: 2  Musculoskeletal: Strength & Muscle Tone: within normal limits Gait & Station: normal Patient leans: N/A  Psychiatric Specialty Exam: Physical Exam  ROS earlier dizziness, lightheadedness, no fall . At this time symptoms improving. No chest pain, no shortness of breath  Blood pressure 120/68, pulse 79, temperature 97.6 F (36.4 C),  temperature source Oral, resp. rate 16, height 5' 8" (1.727 m), weight (!) 186.9 kg (412 lb), SpO2 98 %.Body mass index is 62.64 kg/m.  General Appearance: Fairly Groomed  Eye Contact:  Good  Speech:  Normal Rate  Volume:  Normal  Mood:  reports ongoing depression  Affect:  constricted, although at times smiles and even laughs briefly   Thought Process:  Linear and Descriptions of Associations: Intact  Orientation:  Full (Time, Place, and Person)  Thought Content:  no hallucinations, no delusions, not internally preoccupied no grandiose ideations   Suicidal Thoughts:  No currently denies suicidal ideations, denies self injurious ideations, contracts for safety on unit  Homicidal Thoughts:  No denies any homicidal or violent ideations  Memory:  recent and remote grossly intact   Judgement:  Other:  improving   Insight:  improving   Psychomotor Activity:  Normal  Concentration:  Concentration: Good and Attention Span: Good  Recall:  Good  Fund of Knowledge:  Good  Language:  Good  Akathisia:  Negative  Handed:  Right  AIMS (if indicated):     Assets:  Desire for Improvement Resilience  ADL's:  Intact  Cognition:  WNL  Sleep:  Number of Hours: 5.75   Assessment - patient remains depressed, but denies suicidal ideations. No psychotic symptoms. She developed dizziness , hypotension earlier today, possibly related to Abilify trial, as well as being on antihypertensives . Medications were adjusted, PO fluids pushed and at this time she is  feeling better.   Treatment Plan Summary: Daily contact with patient to assess and evaluate symptoms and progress in treatment, Medication management, Plan inpatient treatment  and medications as below Encourage improved group and milieu participation to work on coping skills and symptom reduction Continue Effexor XR 37.5 mgrs QDAY for depression Continue Abilify 10 mgrs QDAY for mood disorder Antihypertensive medications have been adjusted due to  earlier episode of low BP- HCTZ D/Cd and Norvasc dose decreased to 5 mgrs QDAY - BP will continue to be monitored .  Continue Trazodone 50 mgrs QHS PRN for insomnia Continue Vistaril 25 mgrs Q 6 hours PRN for anxiety  Patient instructed to call RN and stay in bed if feeling dizzy , lightheaded in order to decrease fall risk, although as noted, at this time denies dizziness and BP now normalized to 120/68.  Treatment team working on disposition planning options Jenne Campus, MD 10/12/2016, 6:19 PM

## 2016-10-12 NOTE — Progress Notes (Signed)
Patient stated she was feeling better now, did not need the walker, no dizziness.  Walked to the dining room for dinner.  Patient has been in bed most of the day resting.  Respirations even and unlabored.  No signs/symptoms of pain/distress noted on patient's face/body movements.

## 2016-10-12 NOTE — [facility] Group Notes (Signed)
[facility] LCSW Aftercare Discharge Planning Group Note   10/12/2016 11:27 AM  Participation Quality:  Pt invited. Did not attend.   Georga Kaufmann, MSW, Latanya Presser

## 2016-10-12 NOTE — Progress Notes (Signed)
Adult Psychoeducational Group Note  Date:  10/12/2016 Time:  10:42 PM  Group Topic/Focus:  Wrap-Up Group:   The focus of this group is to help patients review their daily goal of treatment and discuss progress on daily workbooks.  Participation Level:  Active  Participation Quality:  Appropriate  Affect:  Appropriate  Cognitive:  Alert  Insight: Appropriate  Engagement in Group:  Engaged  Modes of Intervention:  Discussion  Additional Comments:  Patient stated her day did not go so good. Patient's anxiety was bad, patient stated she has been tired all day. Patient's goal for today was to work on her attitude.   Hannah Crill L Ivette Castronova 10/12/2016, 10:42 PM

## 2016-10-13 LAB — HIV ANTIBODY (ROUTINE TESTING W REFLEX): HIV Screen 4th Generation wRfx: NONREACTIVE

## 2016-10-13 LAB — HEMOGLOBIN A1C
HEMOGLOBIN A1C: 5.1 % (ref 4.8–5.6)
Mean Plasma Glucose: 100 mg/dL

## 2016-10-13 LAB — PROLACTIN: PROLACTIN: 6.4 ng/mL (ref 4.8–23.3)

## 2016-10-13 LAB — RPR: RPR Ser Ql: NONREACTIVE

## 2016-10-13 NOTE — Progress Notes (Signed)
Patient ID: Renee Steele, female   DOB: 1980/08/15, 35 y.o.   MRN: [id number] D:Affect is flat/sad,mood is depressed. She has been visible in the milieu however has been unable to make it to any groups so far. She rates both her anxiety and depression as a 7 today. A:Support and encouragement offered. R:Receptive. No complaints of pain or problems at this time.

## 2016-10-13 NOTE — Progress Notes (Signed)
Patient ID: Renee Steele, female   DOB: August 01, 1980, 36 y.o.   MRN: [id number] D: Client visible on the unit, seen in dayroom watching TV, interacting with peers. Client reports depression "6" of 10. Goal: "to leave" goal not met but hopes to leave tomorrow. Client reports of this admission "I think it's great in a lot of ways" "I got more attention this time, everybody so nice" "I'm glad I came, but I want to stop the depression cycle" "I believe me being here will enable me to help somebody else" "I'm extremely tired since I been here, I think it's the new medicine" "I think the program would be better if we had an outdoor exercise, something that would help people like me, and some type of music" A : Writer provided emotional support, encouraged to complete patient survey upon discharge. Medication reviewed, administered as ordered. Staff will monitor q60mn for safety. R: client is safe on the unit, attended group.
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
## 2016-10-13 NOTE — [facility] Group Notes (Signed)
The focus of this group is to educate the patient on the purpose and policies of crisis stabilization and provide a format to answer questions about their admission.  The group details unit policies and expectations of patients while admitted.  Patient did not attend 0900 nurse education orientation this morning.  Patient stayed in bed.  

## 2016-10-13 NOTE — [facility] Group Notes (Signed)
[facility] LCSW Group Therapy 10/13/2016 1:15 PM  Type of Therapy: Group Therapy- Feelings about Diagnosis  Participation Level: Pt invited. Did not attend.   Georga Kaufmann, MSW, Latanya Presser [phone]  10/13/2016 3:57 PM

## 2016-10-13 NOTE — Progress Notes (Signed)
[facility] MD Progress Note  10/13/2016 3:24 PM Renee Steele  MRN:  [id number] Subjective:  Patient reports improved mood.  Patient is denying ongoing depression. She denies any suicidal ideations today.  There are no incidents of hypotension and dizziness, patient has low pressure when standing.  Objective: I have discussed case with treatment team and have met with patient.  She states that CSW has been able to set up appointments post discharge.  Future oriented and would like to know when discharge will be. Denies hallucinations, no delusions expressed.  Encouraged to attend groups. Denies current suicidal ideations and contracts for safety on unit. As above, episode of dizziness , hypotension earlier today, now improved. At this time denies feeling dizzy and gait is steady. No disruptive or agitated behaviors on unit, limited milieu/ group  participation at present.   Principal Problem: Mixed bipolar I disorder ([place]) Diagnosis:   Patient Active Problem List   Diagnosis Date Noted  . Mixed bipolar I disorder ([place]) [F31.60]   . Depression [F32.9] 10/10/2016  . Morbid obesity ([place]) [E66.01] 02/24/2015  . Hypothyroidism [E03.9] 02/24/2015  . PCOS (polycystic ovarian syndrome) [E28.2] 02/24/2015  . Diabetes ([place]) [E11.9] 02/24/2015  . Bipolar 1 disorder, depressed, severe ([place]) [F31.4] 11/24/2014   Total Time spent with patient: 20 minutes  Past Medical History:  Past Medical History:  Diagnosis Date  . Bacterial vaginosis   . Chlamydia   . Depression   . Diabetes mellitus without complication ([place])   . HPV in female   . Hypertension   . Hypothyroidism   . Morbid obesity ([place])   . OSA on CPAP   . Post traumatic stress disorder (PTSD)   . Urinary tract infection     Past Surgical History:  Procedure Laterality Date  . TONSILLECTOMY    . WISDOM TOOTH EXTRACTION     Family History:  Family History  Problem Relation Age of Onset  . Bipolar disorder Father   . Bipolar disorder  Mother    Social History:  History  Alcohol Use  . Yes     History  Drug Use No    Social History   Social History  . Marital status: Single    Spouse name: N/A  . Number of children: N/A  . Years of education: N/A   Social History Main Topics  . Smoking status: Never Smoker  . Smokeless tobacco: Never Used  . Alcohol use Yes  . Drug use: No  . Sexual activity: Not Asked   Other Topics Concern  . None   Social History Narrative  . None   Additional Social History:    Pain Medications: See PTA meds  Prescriptions: See PTA meds  Over the Counter: See PTA meds  History of alcohol / drug use?: No history of alcohol / drug abuse Longest period of sobriety (when/how long): N/A  Sleep: Fair  Appetite:  Fair  Current Medications: Current Facility-Administered Medications  Medication Dose Route Frequency Provider Last Rate Last Dose  . acetaminophen (TYLENOL) tablet 650 mg  650 mg Oral Q6H PRN Kerrie Buffalo, NP      . alum & mag hydroxide-simeth (MAALOX/MYLANTA) 200-200-20 MG/5ML suspension 30 mL  30 mL Oral Q4H PRN Kerrie Buffalo, NP      . amLODipine (NORVASC) tablet 5 mg  5 mg Oral Daily Myer Peer Cobos, MD      . ARIPiprazole (ABILIFY) tablet 10 mg  10 mg Oral Daily Jenne Campus, MD   10 mg  at 10/13/16 0803  . hydrOXYzine (ATARAX/VISTARIL) tablet 25 mg  25 mg Oral Q6H PRN Kerrie Buffalo, NP   25 mg at 10/11/16 2103  . ibuprofen (ADVIL,MOTRIN) tablet 600 mg  600 mg Oral Q6H PRN Kerrie Buffalo, NP   600 mg at 10/10/16 2128  . levothyroxine (SYNTHROID, LEVOTHROID) tablet 50 mcg  50 mcg Oral QAC breakfast Kerrie Buffalo, NP   50 mcg at 10/13/16 [phone]  . loratadine (CLARITIN) tablet 10 mg  10 mg Oral Daily Kerrie Buffalo, NP   10 mg at 10/13/16 0803  . magnesium hydroxide (MILK OF MAGNESIA) suspension 30 mL  30 mL Oral Daily PRN Kerrie Buffalo, NP   30 mL at 10/11/16 1827  . metFORMIN (GLUCOPHAGE) tablet 500 mg  500 mg Oral BID WC Kerrie Buffalo, NP   500 mg at  10/13/16 0803  . pantoprazole (PROTONIX) EC tablet 40 mg  40 mg Oral Daily Kerrie Buffalo, NP   40 mg at 10/13/16 0803  . traZODone (DESYREL) tablet 50 mg  50 mg Oral QHS PRN Kerrie Buffalo, NP   50 mg at 10/11/16 2103  . venlafaxine XR (EFFEXOR-XR) 24 hr capsule 37.5 mg  37.5 mg Oral Q breakfast Jenne Campus, MD   37.5 mg at 10/13/16 0803    Lab Results:  Results for orders placed or performed during the hospital encounter of 10/10/16 (from the past 48 hour(s))  HIV antibody     Status: None   Collection Time: 10/12/16  6:09 AM  Result Value Ref Range   HIV Screen 4th Generation wRfx Non Reactive Non Reactive    Comment: (NOTE) Performed At: [facility] [place], [place] [id number] Lindon Romp MD [id number]:[id number] Performed at [facility], [place] [address], [place], [place] [address]   RPR     Status: None   Collection Time: 10/12/16  6:09 AM  Result Value Ref Range   RPR Ser Ql Non Reactive Non Reactive    Comment: (NOTE) Performed At: [facility] [place], [place] [id number] Lindon Romp MD [id number] Performed at [facility], [place] [address], [place], [place] [address]   Lipid panel     Status: Abnormal   Collection Time: 10/12/16  6:09 AM  Result Value Ref Range   Cholesterol 172 0 - 200 mg/dL   Triglycerides 249 (H) <150 mg/dL   HDL 34 (L) >40 mg/dL   Total CHOL/HDL Ratio 5.1 RATIO   VLDL 50 (H) 0 - 40 mg/dL   LDL Cholesterol 88 0 - 99 mg/dL    Comment:        Total Cholesterol/HDL:CHD Risk Coronary Heart Disease Risk Table                     Men   Women  1/2 Average Risk   3.4   3.3  Average Risk       5.0   4.4  2 X Average Risk   9.6   7.1  3 X Average Risk  23.4   11.0        Use the calculated Patient Ratio above and the CHD Risk Table to determine the patient's CHD Risk.        ATP III CLASSIFICATION (LDL):  <100     mg/dL   Optimal  100-129  mg/dL   Near  or Above  Optimal  130-159  mg/dL   Borderline  160-189  mg/dL   High  >190     mg/dL   Very High Performed at [facility] [address] [place], [place] [address]   TSH     Status: None   Collection Time: 10/12/16  6:09 AM  Result Value Ref Range   TSH 1.275 0.350 - 4.500 uIU/mL    Comment: Performed by a 3rd Generation assay with a functional sensitivity of <=0.01 uIU/mL. Performed at [facility], [place] [address] [place], [place] [address]   Prolactin     Status: None   Collection Time: 10/12/16  6:09 AM  Result Value Ref Range   Prolactin 6.4 4.8 - 23.3 ng/mL    Comment: (NOTE) Performed At: [facility] [place], [place] [id number] Lindon Romp MD [id number] Performed at [facility], [place] [address], [place], [place] [address]   Hemoglobin A1c     Status: None   Collection Time: 10/12/16  6:09 AM  Result Value Ref Range   Hgb A1c MFr Bld 5.1 4.8 - 5.6 %    Comment: (NOTE)         Pre-diabetes: 5.7 - 6.4         Diabetes: >6.4         Glycemic control for adults with diabetes: <7.0    Mean Plasma Glucose 100 mg/dL    Comment: (NOTE) Performed At: [facility] [place], [place] [id number] Lindon Romp MD [id number] Performed at [facility], [place] [address] [place], [place] [address]     Blood Alcohol level:  Lab Results  Component Value Date   [facility] <5 10/09/2016   ETH <5 86/75/4492    Metabolic Disorder Labs: Lab Results  Component Value Date   HGBA1C 5.1 10/12/2016   MPG 100 10/12/2016   MPG 111 11/25/2014   Lab Results  Component Value Date   PROLACTIN 6.4 10/12/2016   Lab Results  Component Value Date   CHOL 172 10/12/2016   TRIG 249 (H) 10/12/2016   HDL 34 (L) 10/12/2016   CHOLHDL 5.1 10/12/2016   VLDL 50 (H) 10/12/2016   LDLCALC 88 10/12/2016   LDLCALC 128 (H) 11/24/2014    Physical  Findings: AIMS: Facial and Oral Movements Muscles of Facial Expression: None, normal Lips and Perioral Area: None, normal Jaw: None, normal Tongue: None, normal,Extremity Movements Upper (arms, wrists, hands, fingers): None, normal Lower (legs, knees, ankles, toes): None, normal, Trunk Movements Neck, shoulders, hips: None, normal, Overall Severity Severity of abnormal movements (highest score from questions above): None, normal Incapacitation due to abnormal movements: None, normal Patient's awareness of abnormal movements (rate only patient's report): No Awareness, Dental Status Current problems with teeth and/or dentures?: No Does patient usually wear dentures?: No  CIWA:  CIWA-Ar Total: 1 COWS:  COWS Total Score: 2  Musculoskeletal: Strength & Muscle Tone: within normal limits Gait & Station: normal Patient leans: N/A  Psychiatric Specialty Exam: Physical Exam  Nursing note and vitals reviewed.   ROS earlier dizziness, lightheadedness, no fall . At this time symptoms improving. No chest pain, no shortness of breath  Blood pressure (!) 100/56, pulse (!) 114, temperature 98 F (36.7 C), temperature source Oral, resp. rate 18, height _0  (1.727 m), weight (!) 186.9 kg (412 lb), SpO2 98 %.Body mass index is 62.64 kg/m.  General Appearance: Fairly Groomed  Eye Contact:  Good  Speech:  Normal Rate  Volume:  Normal  Mood:  reports ongoing depression  Affect:  constricted, although at times smiles and even laughs briefly   Thought Process:  Linear and Descriptions of Associations: Intact  Orientation:  Full (Time, Place, and Person)  Thought Content:  no hallucinations, no delusions, not internally preoccupied no grandiose ideations   Suicidal Thoughts:  No currently denies suicidal ideations, denies self injurious ideations, contracts for safety on unit  Homicidal Thoughts:  No denies any homicidal or violent ideations  Memory:  recent and remote grossly intact   Judgement:   Other:  improving   Insight:  improving   Psychomotor Activity:  Normal  Concentration:  Concentration: Good and Attention Span: Good  Recall:  Good  Fund of Knowledge:  Good  Language:  Good  Akathisia:  Negative  Handed:  Right  AIMS (if indicated):     Assets:  Desire for Improvement Resilience  ADL's:  Intact  Cognition:  WNL  Sleep:  Number of Hours: 5.75   Assessment - patient's mood slight improvement, but denies suicidal ideations.  At times remains isolative but states that standing u does aggravate back and hip pain.    Treatment Plan Summary: Daily contact with patient to assess and evaluate symptoms and progress in treatment, Medication management, Plan inpatient treatment  and medications as below.  Plan to continue and reviewed 10/13/2016  Encourage improved group and milieu participation to work on coping skills and symptom reduction Continue Effexor XR 37.5 mgrs QDAY for depression Continue Abilify 10 mgrs QDAY for mood disorder Antihypertensive medications Norvasc dose cont 5 mgrs QDAY - BP will continue to be monitored .  Continue Trazodone 50 mgrs QHS PRN for insomnia Continue Vistaril 25 mgrs Q 6 hours PRN for anxiety  Patient instructed to call RN and stay in bed if feeling dizzy , lightheaded in order to decrease fall risk, although as noted, at this time denies dizziness and BP now normalized to 120/68.  Treatment team working on disposition planning options  Janett Labella, NP [facility] 10/13/2016, 3:24 PM

## 2016-10-13 NOTE — Progress Notes (Signed)
Recreation Therapy Notes  Animal-Assisted Activity (AAA) Program Checklist/Progress Notes Patient Eligibility Criteria Checklist & Daily Group note for Rec TxIntervention  Date: 04.10.2018 Time: 2:45pm Location: [address]   AAA/T Program Assumption of Risk Form signed by Patient/ or Parent Legal Guardian Yes  Patient is free of allergies or sever asthma Yes  Patient reports no fear of animals Yes  Patient reports no history of cruelty to animals Yes  Patient understands his/her participation is voluntary Yes  Patient washes hands before animal contact Yes  Patient washes hands after animal contact Yes  Behavioral Response: Did not attend.   Laureen Ochs Tunisha Ruland, LRT/CTRS       Harlee Pursifull L 10/13/2016 3:09 PM

## 2016-10-14 MED ORDER — METFORMIN HCL 500 MG PO TABS: 500.0000 mg | ORAL_TABLET | Freq: Two times a day (BID) | ORAL | 2 refills | Status: DC

## 2016-10-14 MED ORDER — PANTOPRAZOLE SODIUM 40 MG PO TBEC: 40.0000 mg | DELAYED_RELEASE_TABLET | Freq: Every day | ORAL | 0 refills | Status: DC

## 2016-10-14 MED ORDER — VENLAFAXINE HCL ER 37.5 MG PO CP24: 37.5000 mg | ORAL_CAPSULE | Freq: Every day | ORAL | 0 refills | Status: DC

## 2016-10-14 MED ORDER — LEVOTHYROXINE SODIUM 50 MCG PO TABS: 50.0000 ug | ORAL_TABLET | Freq: Every day | ORAL | 1 refills | Status: DC

## 2016-10-14 MED ORDER — HYDROCHLOROTHIAZIDE 12.5 MG PO TABS: 12.5000 mg | ORAL_TABLET | Freq: Every day | ORAL | 2 refills | Status: DC

## 2016-10-14 MED ORDER — TRAZODONE HCL 50 MG PO TABS: 50.0000 mg | ORAL_TABLET | Freq: Every evening | ORAL | 0 refills | Status: DC | PRN

## 2016-10-14 MED ORDER — AMLODIPINE BESYLATE 5 MG PO TABS: 5.0000 mg | ORAL_TABLET | Freq: Every day | ORAL | 0 refills | Status: DC

## 2016-10-14 MED ORDER — ARIPIPRAZOLE 10 MG PO TABS: 10.0000 mg | ORAL_TABLET | Freq: Every day | ORAL | 0 refills | Status: DC

## 2016-10-14 MED ORDER — HYDROXYZINE HCL 25 MG PO TABS: 25.0000 mg | ORAL_TABLET | Freq: Four times a day (QID) | ORAL | 0 refills | Status: DC | PRN

## 2016-10-14 NOTE — BHH Suicide Risk Assessment (Signed)
[facility] INPATIENT:  Family/Significant Other Suicide Prevention Education  Suicide Prevention Education:  Contact Attempts: Shaffon Ronnald Ramp (friend [phone]), has been identified by the patient as the family member/significant other with whom the patient will be residing, and identified as the person(s) who will aid the patient in the event of a mental health crisis.  With written consent from the patient, two attempts were made to provide suicide prevention education, prior to and/or following the patient's discharge.  We were unsuccessful in providing suicide prevention education.  A suicide education pamphlet was given to the patient to share with family/significant other.   Date and time of second attempt: 10/14/16 @12 :48pm. Voicemail box was full so CSW was unable to leave a message.  Georga Kaufmann, MSW, LCSWA 10/14/2016, 12:48 PM

## 2016-10-14 NOTE — Progress Notes (Signed)
Recreation Therapy Notes  Date: 10/14/16 Time: 0930 Location: [address]  Group Topic: Stress Management  Goal Area(s) Addresses:  Patient will verbalize importance of using healthy stress management.  Patient will identify positive emotions associated with healthy stress management.   Intervention: Stress Management  Activity :  Body Scan Meditation.  LRT introduced the stress management technique of meditation.  LRT played a meditation from the Calm app that focused on body scan to examine the feelings and tension within the body.  Patients were to follow along with the meditation to played to engage in the technique.  Education:  Stress Management, Discharge Planning.   Education Outcome: Acknowledges edcuation/In group clarification offered/Needs additional education  Clinical Observations/Feedback: Pt did not attend group.   Victorino Sparrow, LRT/CTRS         Victorino Sparrow A 10/14/2016 11:31 AM

## 2016-10-14 NOTE — BHH Suicide Risk Assessment (Addendum)
[facility] Discharge Suicide Risk Assessment   Principal Problem: Mixed bipolar I disorder [facility]) Discharge Diagnoses:  Patient Active Problem List   Diagnosis Date Noted  . Mixed bipolar I disorder ([place]) [F31.60]   . Depression [F32.9] 10/10/2016  . Morbid obesity ([place]) [E66.01] 02/24/2015  . Hypothyroidism [E03.9] 02/24/2015  . PCOS (polycystic ovarian syndrome) [E28.2] 02/24/2015  . Diabetes ([place]) [E11.9] 02/24/2015  . Bipolar 1 disorder, depressed, severe ([place]) [F31.4] 11/24/2014    Total Time spent with patient: 30 minutes  Musculoskeletal: Strength & Muscle Tone: within normal limits Gait & Station: normal Patient leans: N/A  Psychiatric Specialty Exam: ROS denies dizziness , denies lightheadedness, denies chest pain, no shortness of breath, no rash   Blood pressure 115/64, pulse 60, temperature 98.7 F (37.1 C), temperature source Oral, resp. rate 18, height 5\' 8"  (1.727 m), weight (!) 186.9 kg (412 lb), SpO2 98 %.Body mass index is 62.64 kg/m.  General Appearance: improved grooming   Eye Contact::  Good  Speech:  Normal Rate409  Volume:  Normal  Mood:  improved,denies depression at this time  Affect:  appropriate, reactive , not expanisve or irritable   Thought Process:  Linear and Descriptions of Associations: Intact  Orientation:  Full (Time, Place, and Person)  Thought Content:  no hallucinations, no delusions, not internally preoccupied   Suicidal Thoughts:  No denies any suicidal or self injurious ideations, denies any homicidal or violent ideations  Homicidal Thoughts:  No  Memory:  recent and remote grossly intact   Judgement:  Other:  improving   Insight:  improving   Psychomotor Activity:  Normal  Concentration:  Good  Recall:  Good  Fund of Knowledge:Good  Language: Good  Akathisia:  Negative  Handed:  Right  AIMS (if indicated):   no abnormal or involuntary movements noted or reported   Assets:  Desire for Improvement Resilience  Sleep:  Number of Hours:  6.5  Cognition: WNL  ADL's:  Intact   Mental Status Per Nursing Assessment::   On Admission:     Demographic Factors:  36 year old female , single, no children.   Loss Factors: Financial difficulties related to excessive gambling , limited local support system  Historical Factors: Prior psychiatric admissions, prior diagnosis of Bipolar Disorder   Risk Reduction Factors:   Positive coping skills or problem solving skills  Continued Clinical Symptoms:  At this time patient reports improvement compared to admission - she reports she is feeling better, and currently minimizes depression, affect is reactive, not expansive or irritable, thought process is linear,no flight of ideations or loose associations, no hallucinations, no delusions, not internally preoccupied, future oriented, denies any suicidal or self injurious ideations.  Behavior on unit in good control. Denies medication side effects- we have reviewed side effects   Cognitive Features That Contribute To Risk:  No gross cognitive deficits noted upon discharge. Is alert , attentive, and oriented x 3     Suicide Risk:  Mild:  Suicidal ideation of limited frequency, intensity, duration, and specificity.  There are no identifiable plans, no associated intent, mild dysphoria and related symptoms, good self-control (both objective and subjective assessment), few other risk factors, and identifiable protective factors, including available and accessible social support.  [facility] Follow up.   Specialty:  Professional Counselor Why:  Therapy appointment on 10/19/16 at [place] with Annia Cuebos-Colon. Medication management appointment 10/22/16 at 8:30AM with Gala Murdoch. Contact information: [facility] [place]  [place] [address] [phone]        [facility] Follow up on 10/15/2016.   Why:  Therapy appointment at  3:30PM with Bennie Pierini for gambling. Contact information: [place], [place] [address] Phone: [phone]       [facility] Follow up.   Why:  Social worker has made a referral on your behalf for ACT services. PSI will contact you upon discharge. Contact information: [place] [address] [phone]           Plan Of Care/Follow-up recommendations:  Activity:  as tolerated  Diet:  hear healthy Tests:  NA Other:  See below Patient is leaving unit in good spirits , states she feels much improved and ready for discharge. She plans to return home Plans to follow up as above. Has been referred for ACT services  She also has an established PCP ( Dr. Aline Brochure) at [facility], plans to follow up for medical issues as needed  Jenne Campus, MD 10/14/2016, 1:31 PM

## 2016-10-14 NOTE — Progress Notes (Signed)
Spiritual care follow-up at pt request.   Renee Steele identifies as Christian and specifically with the [employer], where she notes that in her experience, mental illness is not regarded as a physical problem, but a spiritual one.  Renee Steele has felt disappointment with her pastor's response to mental illness, hearing that her symptoms of bi-polar are a result of "not having positive thoughts,"   To this she responds "If I could change my thoughts, I would"   Chaplain provided space for Renee Steele to voice her dissatisfaction with this response and envision other theological possibilities.  Renee Steele feels a calling to be involved in ministry and specifically to help educate those in the church about her experience of mental illness.    Renee Steele expressed fear in thinking about the future - stated that she worries that she will be in a place where people are attentive to her and then she will stumble / "mess-up."  She mentions experiences with gambling, for which she is seeing a Social worker.  Relates that it is easier to stay hidden, as "when I stumble, no body knows."  Describes this being a barrier to her exploring new avenues in her life.   Spoke with chaplain about immediate steps she could take to begin to live her calling, as well as ways she could be attentive to developing the skills to cope with a public voice.    Renee Steele seeks answers, as these serve to help her fear in the moment - but also gives the caveat "I know you can't give me answers about what the next step is."  Chaplain worked to provide awareness of coping skills that arise from pt's own spiritual grounding and awareness of her illness.      [facility] / [facility] Chaplain Jerene Pitch, MDiv 24h pager [phone]

## 2016-10-14 NOTE — Progress Notes (Signed)
  [facility] Adult Case Management Discharge Plan :  Will you be returning to the same living situation after discharge:  Yes,  pt returning home. At discharge, do you have transportation home?: Yes,  pt has access to transportation. Do you have the ability to pay for your medications: Yes,  pt has insurance.  Release of information consent forms completed and in the chart;  Patient's signature needed at discharge.  Patient to Follow up at: [facility] Follow up.   Specialty:  Professional Counselor Why:  Therapy appointment on 10/19/16 at [place] with Annia Cuebos-Colon. Medication management appointment 10/22/16 at 8:30AM with Gala Murdoch. Contact information: [facility] [place] [address] [phone]        [facility] Follow up on 10/15/2016.   Why:  Therapy appointment at 3:30PM with Bennie Pierini for gambling. Contact information: [place], [place] [address] Phone: [phone]       [facility] Follow up.   Why:  Social worker has made a referral on your behalf for ACT services. PSI will contact you upon discharge. Contact information: [place] [address] [phone]           Next level of care provider has access to [place] and Suicide Prevention discussed: Yes,  with pt.  Have you used any form of tobacco in the last 30 days? (Cigarettes, Smokeless Tobacco, Cigars, and/or Pipes): No  Has patient been referred to the Quitline?: N/A patient is not a smoker  Patient has been referred for addiction treatment: N/A  Georga Kaufmann 10/14/2016, 11:43 AM

## 2016-10-14 NOTE — Discharge Summary (Signed)
Physician Discharge Summary Note  Patient:  Renee Steele is an 36 y.o., female MRN:  [id number] DOB:  [birth year] Patient phone:  [phone] (home)  Patient address:   [place] [address],  Total Time spent with patient: 30 minutes  Date of Admission:  10/10/2016 Date of Discharge: 10/14/2016  Reason for Admission:    Principal Problem: Mixed bipolar I disorder [facility]) Discharge Diagnoses: Patient Active Problem List   Diagnosis Date Noted  . Mixed bipolar I disorder ([place]) [F31.60]   . Depression [F32.9] 10/10/2016  . Morbid obesity ([place]) [E66.01] 02/24/2015  . Hypothyroidism [E03.9] 02/24/2015  . PCOS (polycystic ovarian syndrome) [E28.2] 02/24/2015  . Diabetes ([place]) [E11.9] 02/24/2015  . Bipolar 1 disorder, depressed, severe ([place]) [F31.4] 11/24/2014    Past Psychiatric History: see HPI  Past Medical History:  Past Medical History:  Diagnosis Date  . Bacterial vaginosis   . Chlamydia   . Depression   . Diabetes mellitus without complication ([place])   . HPV in female   . Hypertension   . Hypothyroidism   . Morbid obesity ([place])   . OSA on CPAP   . Post traumatic stress disorder (PTSD)   . Urinary tract infection     Past Surgical History:  Procedure Laterality Date  . TONSILLECTOMY    . WISDOM TOOTH EXTRACTION     Family History:  Family History  Problem Relation Age of Onset  . Bipolar disorder Father   . Bipolar disorder Mother    Family Psychiatric  History: see HPI Social History:  History  Alcohol Use  . Yes     History  Drug Use No    Social History   Social History  . Marital status: Single    Spouse name: N/A  . Number of children: N/A  . Years of education: N/A   Social History Main Topics  . Smoking status: Never Smoker  . Smokeless tobacco: Never Used  . Alcohol use Yes  . Drug use: No  . Sexual activity: Not Asked   Other Topics Concern  . None   Social History Narrative  . None    Hospital Course:   Renee Steele an 36 y.o.femalewho presented to the ED voluntarily due to increased suicidal thoughts. Pt reported she was diagnosed with Bipolar d/o "a long time ago" and stated she has been depressed ever since.   Renee Steele was admitted for Mixed bipolar I disorder ([place]) and crisis management.  Patient was treated with medications with their indications listed below in detail under Medication List.  Medical problems were identified and treated as needed.  Home medications were restarted as appropriate.  Improvement was monitored by observation and Renee Steele daily report of symptom reduction.  Emotional and mental status was monitored by daily self inventory reports completed by Renee Steele and clinical staff.  Patient reported continued improvement, denied any new concerns.  Patient had been compliant on medications and denied side effects.  Support and encouragement was provided.    Patient encouraged to attend groups to help with recognizing triggers of emotional crises and de-stabilizations.  Patient encouraged to attend group to help identify the positive things in life that would help in dealing with feelings of loss, depression and unhealthy or abusive tendencies.         Renee Steele was evaluated by the treatment team for stability and plans for continued recovery upon discharge.  Patient was offered further treatment options upon discharge including  Residential, Intensive Outpatient and Outpatient treatment. Patient will follow up with agency listed below for medication management and counseling.  Encouraged patient to maintain satisfactory support network and home environment.  Advised to adhere to medication compliance and outpatient treatment follow up.  Prescriptions provided.       Renee Steele motivation was an integral factor for scheduling further treatment.  Employment, transportation, bed availability, health status, family support, and any pending legal  issues were also considered during patient's hospital stay.  Upon completion of this admission the patient was both mentally and medically stable for discharge denying suicidal/homicidal ideation, auditory/visual/tactile hallucinations, delusional thoughts and paranoia.     Physical Findings: AIMS: Facial and Oral Movements Muscles of Facial Expression: None, normal Lips and Perioral Area: None, normal Jaw: None, normal Tongue: None, normal,Extremity Movements Upper (arms, wrists, hands, fingers): None, normal Lower (legs, knees, ankles, toes): None, normal, Trunk Movements Neck, shoulders, hips: None, normal, Overall Severity Severity of abnormal movements (highest score from questions above): None, normal Incapacitation due to abnormal movements: None, normal Patient's awareness of abnormal movements (rate only patient's report): No Awareness, Dental Status Current problems with teeth and/or dentures?: No Does patient usually wear dentures?: No  CIWA:  CIWA-Ar Total: 1 COWS:  COWS Total Score: 2  Musculoskeletal: Strength & Muscle Tone: within normal limits Gait & Station: normal Patient leans: N/A  Psychiatric Specialty Exam:  See MD SRA Physical Exam  Nursing note and vitals reviewed.   ROS  Blood pressure 115/64, pulse 60, temperature 98.7 F (37.1 C), temperature source Oral, resp. rate 18, height 5\' 8"  (1.727 m), weight (!) 186.9 kg (412 lb), SpO2 98 %.Body mass index is 62.64 kg/m.   Have you used any form of tobacco in the last 30 days? (Cigarettes, Smokeless Tobacco, Cigars, and/or Pipes): No  Has this patient used any form of tobacco in the last 30 days? (Cigarettes, Smokeless Tobacco, Cigars, and/or Pipes) Yes, N/A  Blood Alcohol level:  Lab Results  Component Value Date   ETH <5 10/09/2016   ETH <5 29/56/2130    Metabolic Disorder Labs:  Lab Results  Component Value Date   HGBA1C 5.1 10/12/2016   MPG 100 10/12/2016   MPG 111 11/25/2014   Lab Results   Component Value Date   PROLACTIN 6.4 10/12/2016   Lab Results  Component Value Date   CHOL 172 10/12/2016   TRIG 249 (H) 10/12/2016   HDL 34 (L) 10/12/2016   CHOLHDL 5.1 10/12/2016   VLDL 50 (H) 10/12/2016   LDLCALC 88 10/12/2016   LDLCALC 128 (H) 11/24/2014    See Psychiatric Specialty Exam and Suicide Risk Assessment completed by Attending Physician prior to discharge.  Discharge destination:  Home  Is patient on multiple antipsychotic therapies at discharge:  No   Has Patient had three or more failed trials of antipsychotic monotherapy by history:  No  Recommended Plan for Multiple Antipsychotic Therapies: NA   Allergies as of 10/14/2016      Reactions   Lisinopril Shortness Of Breath, Cough      Medication List    STOP taking these medications   buPROPion 300 MG 24 hr tablet Commonly known as:  WELLBUTRIN XL   ibuprofen 200 MG tablet Commonly known as:  ADVIL,MOTRIN   loratadine 10 MG tablet Commonly known as:  CLARITIN   metroNIDAZOLE 0.75 % vaginal gel Commonly known as:  METROGEL VAGINAL   multivitamin with minerals Tabs tablet   omeprazole 40 MG capsule Commonly known as:  PRILOSEC   OXcarbazepine 150 MG tablet Commonly known as:  TRILEPTAL   polyethylene glycol packet Commonly known as:  MIRALAX     TAKE these medications     Indication  amLODipine 5 MG tablet Commonly known as:  NORVASC Take 1 tablet (5 mg total) by mouth daily. Start taking on:  10/15/2016 What changed:  medication strength  how much to take  Indication:  High Blood Pressure Disorder   ARIPiprazole 10 MG tablet Commonly known as:  ABILIFY Take 1 tablet (10 mg total) by mouth daily. Start taking on:  10/15/2016  Indication:  miid stabilization   hydrochlorothiazide 12.5 MG tablet Commonly known as:  HYDRODIURIL Take 1 tablet (12.5 mg total) by mouth daily.  Indication:  High Blood Pressure Disorder   hydrOXYzine 25 MG tablet Commonly known as:   ATARAX/VISTARIL Take 1 tablet (25 mg total) by mouth every 6 (six) hours as needed for anxiety.  Indication:  Anxiety Neurosis   levothyroxine 50 MCG tablet Commonly known as:  SYNTHROID, LEVOTHROID Take 1 tablet (50 mcg total) by mouth daily.  Indication:  Underactive Thyroid   metFORMIN 500 MG tablet Commonly known as:  GLUCOPHAGE Take 1 tablet (500 mg total) by mouth 2 (two) times daily with a meal.  Indication:  Polycystic Ovary Syndrome   pantoprazole 40 MG tablet Commonly known as:  PROTONIX Take 1 tablet (40 mg total) by mouth daily. Start taking on:  10/15/2016  Indication:  mood stabilization   traZODone 50 MG tablet Commonly known as:  DESYREL Take 1 tablet (50 mg total) by mouth at bedtime as needed for sleep.  Indication:  Major Depressive Disorder   venlafaxine XR 37.5 MG 24 hr capsule Commonly known as:  EFFEXOR-XR Take 1 capsule (37.5 mg total) by mouth daily with breakfast. Start taking on:  10/15/2016  Indication:  Major Depressive Disorder      Follow-up [facility] Follow up.   Specialty:  Professional Counselor Why:  Therapy appointment on 10/19/16 at [facility] with Annia Cuebos-Colon. Medication management appointment 10/22/16 at 8:30AM with Gala Murdoch. Contact information: [facility] [address] [phone]        [facility] Follow up on 10/15/2016.   Why:  Therapy appointment at 3:30PM with Bennie Pierini for gambling. Contact information: [place], [place] [address] Phone: [phone]       [facility] Follow up.   Why:  Social worker has made a referral on your behalf for ACT services. [facility] will contact you upon discharge. Contact information: [place]  [phone]           Follow-up recommendations:  Activity:  as tol Diet:  as tol  Comments:  1.  Take all  your medications as prescribed.   2.  Report any adverse side effects to outpatient provider. 3.  Patient instructed to not use alcohol or illegal drugs while on prescription medicines. 4.  In the event of worsening symptoms, instructed patient to call 911, the crisis hotline or go to nearest emergency room for evaluation of symptoms.  Signed: Janett Labella, NP [facility] 10/14/2016, 1:45 PM   Patient seen, Suicide Assessment Completed.  Disposition Plan Reviewed

## 2016-10-14 NOTE — Progress Notes (Signed)
Per pt request, CSW made an ACT team referral to [facility] on behalf of the pt.  Renee Steele, MSW, [place]

## 2016-10-14 NOTE — Progress Notes (Signed)
Patient ID: Renee Steele, female   DOB: [birth year], 36 y.o.   MRN: [id number]  DAR: Pt. Denies SI/HI and A/V Hallucinations. She reports her sleep last night was fair because her CPAP mask got cracked and she has to get a replacement after discharge. She reports appetite is poor, energy level is normal, and concentration is poor. She rates depression 7/10, hopelessness 0/10, and anxiety 4/10. Patient does not report any pain at this time. Support and encouragement provided to the patient. Scheduled medications administered to patient except for scheduled Norvasc because patient refused. Patient is cooperative and interacts appropriately with staff. She is seen in the milieu and appears to be attending group. She reports she feels ready for discharge. Q15 minute checks are maintained for safety.

## 2016-10-14 NOTE — Progress Notes (Signed)
Discharge note:  Patient discharged home per MD order.  Patient received all personal belongings from unit and locker.  Patient denies any thoughts of self harm.  Reviewed AVS/transition record with patient and she indicated understanding.  Patient left ambulatory with [facility].  She has her own transportation at [facility].  Patient left in good spirits.  She left with prescriptions and medication samples.

## 2016-10-14 NOTE — Progress Notes (Signed)
Pt attend wrap up group. Her day was a 6. Her goal was to leave today that did not happen.

## 2016-10-14 NOTE — [facility] Suicide Risk Assessment (Signed)
[place] INPATIENT:  Family/Significant Other Suicide Prevention Education  Suicide Prevention Education:  Contact Attempts: Renee Steele, friend, [phone], (name of family member/significant other) has been identified by the patient as the family member/significant other with whom the patient will be residing, and identified as the person(s) who will aid the patient in the event of a mental health crisis.  With written consent from the patient, two attempts were made to provide suicide prevention education, prior to and/or following the patient's discharge.  We were unsuccessful in providing suicide prevention education.  A suicide education pamphlet was given to the patient to share with family/significant other.  Date and time of first attempt: 10/14/16 at 12:19 PM - could not leave message as VM was full   Renee Steele 10/14/2016, 12:18 PM

## 2016-12-04 ENCOUNTER — Ambulatory Visit (INDEPENDENT_AMBULATORY_CARE_PROVIDER_SITE_OTHER): Payer: Self-pay | Admitting: Urgent Care

## 2016-12-04 ENCOUNTER — Encounter: Payer: Self-pay | Admitting: Urgent Care

## 2016-12-04 VITALS — BP 146/82 | HR 95 | Temp 98.5°F | Resp 17 | Ht 68.5 in | Wt >= 6400 oz

## 2016-12-04 DIAGNOSIS — Z024 Encounter for examination for driving license: Secondary | ICD-10-CM

## 2016-12-04 DIAGNOSIS — I10 Essential (primary) hypertension: Secondary | ICD-10-CM

## 2016-12-04 NOTE — Progress Notes (Signed)
  Commercial Driver Medical Examination   Renee Steele is a 36 y.o. 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 Patient [place]. The patient reports a history of HTN, currently managed with HCTZ. Denies dizziness, chronic headache, blurred vision, chest pain, shortness of breath, heart racing, palpitations, nausea, vomiting, abdominal pain, hematuria, lower leg swelling. Denies smoking cigarettes or drinking alcohol.   The following portions of the patient's history were reviewed and updated as appropriate: allergies, current medications, past family history, past medical history, past social history and past surgical history.  Objective:   BP (!) 155/88   Pulse 95   Temp 98.5 F (36.9 C) (Oral)   Resp 17   Ht 5' 8.5" (1.74 m)   Wt (!) 415 lb (188.2 kg)   LMP 09/16/2016 (Approximate)   SpO2 98%   BMI 62.18 kg/m   Vision/hearing:  Visual Acuity Screening   Right eye Left eye Both eyes  Without correction: 20/15 20/15 20/15   With correction:     Hearing Screening Comments: Peripheral Vision: Right eye 85  degrees. Left eye 85 degrees. The patient can distinguish the colors red, amber and green. The patient was able to hear a forced whisper from L=10 R=10  feet.  Patient can recognize and distinguish among traffic control signals and devices showing standard red, green, and amber colors.  Corrective lenses required: No  Monocular Vision?: No  Hearing aid requirement: No  Physical Exam  Constitutional: She is oriented to person, place, and time. She appears well-developed and well-nourished.  Body habitus morbidly obese.  HENT:  TM's intact bilaterally, no effusions or erythema. Nasal turbinates pink and moist, nasal passages patent. No sinus tenderness. Oropharynx clear, mucous membranes moist, dentition in good repair.  Eyes: Conjunctivae and EOM are normal. Pupils are equal, round, and reactive to light. Right eye exhibits no  discharge. Left eye exhibits no discharge. No scleral icterus.  Neck: Normal range of motion. Neck supple. No thyromegaly present.  Cardiovascular: Normal rate, regular rhythm and intact distal pulses.  Exam reveals no gallop and no friction rub.   No murmur heard. Pulmonary/Chest: No respiratory distress. She has no wheezes. She has no rales.  Abdominal: Soft. Bowel sounds are normal. She exhibits no distension and no mass. There is no tenderness.  Musculoskeletal: Normal range of motion. She exhibits no edema or tenderness.  Lymphadenopathy:    She has no cervical adenopathy.  Neurological: She is alert and oriented to person, place, and time. She has normal reflexes.  Skin: Skin is warm and dry. No rash noted. No erythema. No pallor.  Psychiatric: She has a normal mood and affect.    Labs: Comments: Unrinalysis- 1.015 Sp.Gr.  neg Protein,  neg blood, neg sugar  Assessment:    Healthy female exam.  Meets standards, but periodic monitoring required due to HTN.  Driver qualified only for 3 months.    Plan:   Medical examiners certificate completed and printed. Return as needed.  Jaynee Eagles, PA-C [facility] [phone] 12/04/2016  8:54 AM

## 2016-12-04 NOTE — Patient Instructions (Addendum)
Hypertension Hypertension, commonly called high blood pressure, is when the force of blood pumping through the arteries is too strong. The arteries are the blood vessels that carry blood from the heart throughout the body. Hypertension forces the heart to work harder to pump blood and may cause arteries to become narrow or stiff. Having untreated or uncontrolled hypertension can cause heart attacks, strokes, kidney disease, and other problems. A blood pressure reading consists of a higher number over a lower number. Ideally, your blood pressure should be below 120/80. The first ("top") number is called the systolic pressure. It is a measure of the pressure in your arteries as your heart beats. The second ("bottom") number is called the diastolic pressure. It is a measure of the pressure in your arteries as the heart relaxes. What are the causes? The cause of this condition is not known. What increases the risk? Some risk factors for high blood pressure are under your control. Others are not. Factors you can change  Smoking.  Having type 2 diabetes mellitus, high cholesterol, or both.  Not getting enough exercise or physical activity.  Being overweight.  Having too much fat, sugar, calories, or salt (sodium) in your diet.  Drinking too much alcohol. Factors that are difficult or impossible to change  Having chronic kidney disease.  Having a family history of high blood pressure.  Age. Risk increases with age.  Race. You may be at higher risk if you are African-American.  Gender. Men are at higher risk than women before age 45. After age 65, women are at higher risk than men.  Having obstructive sleep apnea.  Stress. What are the signs or symptoms? Extremely high blood pressure (hypertensive crisis) may cause:  Headache.  Anxiety.  Shortness of breath.  Nosebleed.  Nausea and vomiting.  Severe chest pain.  Jerky movements you cannot control (seizures).  How is this  diagnosed? This condition is diagnosed by measuring your blood pressure while you are seated, with your arm resting on a surface. The cuff of the blood pressure monitor will be placed directly against the skin of your upper arm at the level of your heart. It should be measured at least twice using the same arm. Certain conditions can cause a difference in blood pressure between your right and left arms. Certain factors can cause blood pressure readings to be lower or higher than normal (elevated) for a short period of time:  When your blood pressure is higher when you are in a health care provider's office than when you are at home, this is called white coat hypertension. Most people with this condition do not need medicines.  When your blood pressure is higher at home than when you are in a health care provider's office, this is called masked hypertension. Most people with this condition may need medicines to control blood pressure.  If you have a high blood pressure reading during one visit or you have normal blood pressure with other risk factors:  You may be asked to return on a different day to have your blood pressure checked again.  You may be asked to monitor your blood pressure at home for 1 week or longer.  If you are diagnosed with hypertension, you may have other blood or imaging tests to help your health care provider understand your overall risk for other conditions. How is this treated? This condition is treated by making healthy lifestyle changes, such as eating healthy foods, exercising more, and reducing your alcohol intake. Your   health care provider may prescribe medicine if lifestyle changes are not enough to get your blood pressure under control, and if:  Your systolic blood pressure is above 130.  Your diastolic blood pressure is above 80.  Your personal target blood pressure may vary depending on your medical conditions, your age, and other factors. Follow these  instructions at home: Eating and drinking  Eat a diet that is high in fiber and potassium, and low in sodium, added sugar, and fat. An example eating plan is called the DASH (Dietary Approaches to Stop Hypertension) diet. To eat this way: ? Eat plenty of fresh fruits and vegetables. Try to fill half of your plate at each meal with fruits and vegetables. ? Eat whole grains, such as whole wheat pasta, brown rice, or whole grain bread. Fill about one quarter of your plate with whole grains. ? Eat or drink low-fat dairy products, such as skim milk or low-fat yogurt. ? Avoid fatty cuts of meat, processed or cured meats, and poultry with skin. Fill about one quarter of your plate with lean proteins, such as fish, chicken without skin, beans, eggs, and tofu. ? Avoid premade and processed foods. These tend to be higher in sodium, added sugar, and fat.  Reduce your daily sodium intake. Most people with hypertension should eat less than 1,500 mg of sodium a day.  Limit alcohol intake to no more than 1 drink a day for nonpregnant women and 2 drinks a day for men. One drink equals 12 oz of beer, 5 oz of wine, or 1 oz of hard liquor. Lifestyle  Work with your health care provider to maintain a healthy body weight or to lose weight. Ask what an ideal weight is for you.  Get at least 30 minutes of exercise that causes your heart to beat faster (aerobic exercise) most days of the week. Activities may include walking, swimming, or biking.  Include exercise to strengthen your muscles (resistance exercise), such as pilates or lifting weights, as part of your weekly exercise routine. Try to do these types of exercises for 30 minutes at least 3 days a week.  Do not use any products that contain nicotine or tobacco, such as cigarettes and e-cigarettes. If you need help quitting, ask your health care provider.  Monitor your blood pressure at home as told by your health care provider.  Keep all follow-up visits as  told by your health care provider. This is important. Medicines  Take over-the-counter and prescription medicines only as told by your health care provider. Follow directions carefully. Blood pressure medicines must be taken as prescribed.  Do not skip doses of blood pressure medicine. Doing this puts you at risk for problems and can make the medicine less effective.  Ask your health care provider about side effects or reactions to medicines that you should watch for. Contact a health care provider if:  You think you are having a reaction to a medicine you are taking.  You have headaches that keep coming back (recurring).  You feel dizzy.  You have swelling in your ankles.  You have trouble with your vision. Get help right away if:  You develop a severe headache or confusion.  You have unusual weakness or numbness.  You feel faint.  You have severe pain in your chest or abdomen.  You vomit repeatedly.  You have trouble breathing. Summary  Hypertension is when the force of blood pumping through your arteries is too strong. If this condition is not   controlled, it may put you at risk for serious complications.  Your personal target blood pressure may vary depending on your medical conditions, your age, and other factors. For most people, a normal blood pressure is less than 120/80.  Hypertension is treated with lifestyle changes, medicines, or a combination of both. Lifestyle changes include weight loss, eating a healthy, low-sodium diet, exercising more, and limiting alcohol. This information is not intended to replace advice given to you by your health care provider. Make sure you discuss any questions you have with your health care provider. Document Released: 06/22/2005 Document Revised: 05/20/2016 Document Reviewed: 05/20/2016 Elsevier Interactive Patient Education  2018 Elsevier Inc.     IF you received an x-ray today, you will receive an invoice from [facility]. Please contact [facility] at [phone] with questions or concerns regarding your invoice.   IF you received labwork today, you will receive an invoice from [facility]. Please contact [facility] at [phone] with questions or concerns regarding your invoice.   Our billing staff will not be able to assist you with questions regarding bills from these companies.  You will be contacted with the lab results as soon as they are available. The fastest way to get your results is to activate your My Chart account. Instructions are located on the last page of this paperwork. If you have not heard from us regarding the results in 2 weeks, please contact this office.     

## 2016-12-21 ENCOUNTER — Encounter (HOSPITAL_COMMUNITY): Payer: Self-pay | Admitting: Emergency Medicine

## 2016-12-21 ENCOUNTER — Ambulatory Visit (HOSPITAL_COMMUNITY)
Admission: EM | Admit: 2016-12-21 | Discharge: 2016-12-21 | Disposition: A | Discharge status: Home / Self Care | Payer: Medicaid Other | Attending: Family Medicine | Admitting: Family Medicine

## 2016-12-21 DIAGNOSIS — B948 Sequelae of other specified infectious and parasitic diseases: Secondary | ICD-10-CM | POA: Diagnosis not present

## 2016-12-21 DIAGNOSIS — Z87440 Personal history of urinary (tract) infections: Secondary | ICD-10-CM | POA: Insufficient documentation

## 2016-12-21 DIAGNOSIS — N898 Other specified noninflammatory disorders of vagina: Secondary | ICD-10-CM | POA: Insufficient documentation

## 2016-12-21 DIAGNOSIS — G4733 Obstructive sleep apnea (adult) (pediatric): Secondary | ICD-10-CM | POA: Diagnosis not present

## 2016-12-21 DIAGNOSIS — E039 Hypothyroidism, unspecified: Secondary | ICD-10-CM | POA: Insufficient documentation

## 2016-12-21 DIAGNOSIS — Z3202 Encounter for pregnancy test, result negative: Secondary | ICD-10-CM | POA: Diagnosis not present

## 2016-12-21 DIAGNOSIS — E119 Type 2 diabetes mellitus without complications: Secondary | ICD-10-CM | POA: Diagnosis not present

## 2016-12-21 DIAGNOSIS — F4310 Post-traumatic stress disorder, unspecified: Secondary | ICD-10-CM | POA: Diagnosis not present

## 2016-12-21 DIAGNOSIS — A749 Chlamydial infection, unspecified: Secondary | ICD-10-CM | POA: Diagnosis not present

## 2016-12-21 DIAGNOSIS — I10 Essential (primary) hypertension: Secondary | ICD-10-CM | POA: Diagnosis not present

## 2016-12-21 DIAGNOSIS — Z202 Contact with and (suspected) exposure to infections with a predominantly sexual mode of transmission: Secondary | ICD-10-CM | POA: Diagnosis present

## 2016-12-21 DIAGNOSIS — N760 Acute vaginitis: Secondary | ICD-10-CM | POA: Diagnosis not present

## 2016-12-21 DIAGNOSIS — Z113 Encounter for screening for infections with a predominantly sexual mode of transmission: Secondary | ICD-10-CM | POA: Diagnosis not present

## 2016-12-21 DIAGNOSIS — Z7984 Long term (current) use of oral hypoglycemic drugs: Secondary | ICD-10-CM | POA: Insufficient documentation

## 2016-12-21 MED ORDER — AMLODIPINE BESYLATE 5 MG PO TABS: 5.0000 mg | ORAL_TABLET | Freq: Every day | ORAL | 0 refills | Status: DC

## 2016-12-21 MED ORDER — DOXYCYCLINE HYCLATE 100 MG PO CAPS: 100.0000 mg | ORAL_CAPSULE | Freq: Two times a day (BID) | ORAL | 0 refills | Status: DC

## 2016-12-21 MED ORDER — METRONIDAZOLE 500 MG PO TABS: 500.0000 mg | ORAL_TABLET | Freq: Two times a day (BID) | ORAL | 0 refills | Status: DC

## 2016-12-21 MED ORDER — CEFTRIAXONE SODIUM 250 MG IJ SOLR
INTRAMUSCULAR | Status: AC
  Filled 2016-12-21: qty 250

## 2016-12-21 MED ORDER — CEFTRIAXONE SODIUM 250 MG IJ SOLR
250.0000 mg | Freq: Once | INTRAMUSCULAR | Status: AC
  Administered 2016-12-21: 250 mg via INTRAMUSCULAR

## 2016-12-21 MED ORDER — AZITHROMYCIN 250 MG PO TABS
1000.0000 mg | ORAL_TABLET | Freq: Once | ORAL | Status: AC
  Administered 2016-12-21: 1000 mg via ORAL

## 2016-12-21 MED ORDER — AZITHROMYCIN 250 MG PO TABS
ORAL_TABLET | ORAL | Status: AC
  Filled 2016-12-21: qty 4

## 2016-12-21 MED ORDER — HYDROCHLOROTHIAZIDE 12.5 MG PO TABS: 12.5000 mg | ORAL_TABLET | Freq: Every day | ORAL | 2 refills | Status: DC

## 2016-12-21 NOTE — ED Triage Notes (Signed)
Pt here for HTN.... Reports BP today at [facility] was 194/100  BP now is 164/90  Also wants to be checked for STDs.... Reports a one night stand w/no no condoms  Sx include: vag d/c w/foul odor, vag itching, urinary freq and dysuria  A&O x4... NAD... Ambulatory

## 2016-12-21 NOTE — Discharge Instructions (Signed)
I have refilled your amlodipine, and hydrochlorothiazide. Schedule appointment with your primary care provider for further evaluation and management of your high blood pressure. If you have any headache, chest pain, shortness of breath, numbness, or blurred vision, these are all signs in need to go to the emergency room, not the urgent care.  You've been screened for gonorrhea, chlamydia, Trichomonas, BV, and yeast. You've also had blood work drawn for HIV, syphilis. He been treated in clinic today with Rocephin, and azithromycin, and I have started you on metronidazole and doxycycline. Do not drink any alcohol for the next 7 days were on metronidazole as it will make you very ill. Practice safe sex with the use of condoms, or a barrier method, and I encouraged you to have your sexual partners tested as well.

## 2016-12-21 NOTE — ED Provider Notes (Signed)
CSN: [id number]     Arrival date & time 12/21/16  1518 History   None    Chief Complaint  Patient presents with  . Hypertension  . Exposure to STD   (Consider location/radiation/quality/duration/timing/severity/associated sxs/prior Treatment) Renee Steele is a 36 y.o. female who presents to the [facility] with a chief complaint of vaginal discharge, and for hypertension. She states [facility] earlier today for testing, however no testing was done at that time, as her blood pressure was 194/100, and they recommended or go to the ER. She did not go to the ER, however she went here instead, at the time she is at the health Department she had headache, shortness of breath, and blurred vision. She states he symptoms have since resolved, she is not any pain or discomfort, no difficulty breathing, no chest pain or discomfort, no blurred vision, or headache, or other markers of any organ damage. She reports she has been out of her high blood pressure medication, amlodipine, and hydrochlorothiazide, for 1 week. She is followed by [facility], however she has not scheduled an appointment for follow-up care in some time. She is sexually active, not on birth control, does not use condoms, last menstrual period 12/08/2016.   The history is provided by the patient.    Past Medical History:  Diagnosis Date  . Bacterial vaginosis   . Chlamydia   . Depression   . Diabetes mellitus without complication ([place])   . HPV in female   . Hypertension   . Hypothyroidism   . Morbid obesity ([place])   . OSA on CPAP   . Post traumatic stress disorder (PTSD)   . Urinary tract infection    Past Surgical History:  Procedure Laterality Date  . TONSILLECTOMY    . WISDOM TOOTH EXTRACTION     Family History  Problem Relation Age of Onset  . Bipolar disorder Father   . Bipolar disorder Mother    Social History  Substance Use Topics  . Smoking status: Never  Smoker  . Smokeless tobacco: Never Used  . Alcohol use Yes   OB History    No data available     Review of Systems  Constitutional: Negative.   HENT: Negative.   Respiratory: Negative.   Cardiovascular: Negative.   Gastrointestinal: Negative.   Genitourinary: Positive for dyspareunia, vaginal discharge and vaginal pain. Negative for flank pain and pelvic pain.  Musculoskeletal: Negative.   Skin: Negative.   Neurological: Negative.     Allergies  Lisinopril and Latex  Home Medications   Prior to Admission medications   Medication Sig Start Date End Date Taking? Authorizing Provider  ARIPiprazole (ABILIFY) 10 MG tablet Take 1 tablet (10 mg total) by mouth daily. 10/15/16  Yes Kerrie Buffalo, NP  hydrOXYzine (ATARAX/VISTARIL) 25 MG tablet Take 1 tablet (25 mg total) by mouth every 6 (six) hours as needed for anxiety. 10/14/16  Yes Kerrie Buffalo, NP  levothyroxine (SYNTHROID, LEVOTHROID) 50 MCG tablet Take 1 tablet (50 mcg total) by mouth daily. 10/14/16  Yes Kerrie Buffalo, NP  pantoprazole (PROTONIX) 40 MG tablet Take 1 tablet (40 mg total) by mouth daily. 10/15/16  Yes Kerrie Buffalo, NP  venlafaxine XR (EFFEXOR-XR) 37.5 MG 24 hr capsule Take 1 capsule (37.5 mg total) by mouth daily with breakfast. 10/15/16  Yes Kerrie Buffalo, NP  amLODipine (NORVASC) 5 MG tablet Take 1 tablet (5 mg total) by mouth daily. 12/21/16   Barnet Glasgow,  NP  doxycycline (VIBRAMYCIN) 100 MG capsule Take 1 capsule (100 mg total) by mouth 2 (two) times daily. 12/21/16   Barnet Glasgow, NP  hydrochlorothiazide (HYDRODIURIL) 12.5 MG tablet Take 1 tablet (12.5 mg total) by mouth daily. 12/21/16   Barnet Glasgow, NP  metFORMIN (GLUCOPHAGE) 500 MG tablet Take 1 tablet (500 mg total) by mouth 2 (two) times daily with a meal. 10/14/16   Kerrie Buffalo, NP  metroNIDAZOLE (FLAGYL) 500 MG tablet Take 1 tablet (500 mg total) by mouth 2 (two) times daily. 12/21/16   Barnet Glasgow, NP  traZODone (DESYREL) 50  MG tablet Take 1 tablet (50 mg total) by mouth at bedtime as needed for sleep. 10/14/16   Kerrie Buffalo, NP   Meds Ordered and Administered this Visit   Medications  cefTRIAXone (ROCEPHIN) injection 250 mg (250 mg Intramuscular Given 12/21/16 1621)  azithromycin (ZITHROMAX) tablet 1,000 mg (1,000 mg Oral Given 12/21/16 1640)    BP (!) 164/90 (BP Location: Left Arm) Comment: notified cma  Pulse 98   Temp 98.7 F (37.1 C) (Oral)   Resp 16   LMP 12/08/2016   SpO2 100%  No data found.   Physical Exam  Constitutional: She is oriented to person, place, and time. She appears well-developed and well-nourished. No distress.  HENT:  Head: Normocephalic and atraumatic.  Right Ear: External ear normal.  Left Ear: External ear normal.  Eyes: Conjunctivae are normal.  Neck: Normal range of motion.  Cardiovascular: Normal rate and regular rhythm.   Pulmonary/Chest: Effort normal and breath sounds normal.  Abdominal: Hernia confirmed negative in the right inguinal area and confirmed negative in the left inguinal area.  Genitourinary: Pelvic exam was performed with patient supine. There is tenderness on the right labia. There is tenderness on the left labia. Cervix exhibits discharge. Cervix exhibits no motion tenderness and no friability. Right adnexum displays no tenderness. Left adnexum displays no tenderness. No bleeding in the vagina. No foreign body in the vagina. Vaginal discharge found.  Lymphadenopathy:       Right: No inguinal adenopathy present.       Left: No inguinal adenopathy present.  Neurological: She is alert and oriented to person, place, and time.  Skin: Skin is warm and dry. Capillary refill takes less than 2 seconds. No rash noted. She is not diaphoretic. No erythema.  Psychiatric: She has a normal mood and affect. Her behavior is normal.  Nursing note and vitals reviewed.   Urgent Care Course     Procedures (including critical care time)  Labs Review Labs Reviewed   HIV ANTIBODY (ROUTINE TESTING)  RPR  CERVICOVAGINAL ANCILLARY ONLY    Imaging Review No results found.     MDM   1. Essential hypertension   2. Screening examination for STD (sexually transmitted disease)   3. Vaginal discharge     REX MAGEE is a 36 y.o. female with a One-week history  of vaginal discharge along with hypertension.  Patient given refills of hydrochlorothiazide, amlodipine, encouraged to follow up with her primary care provider for further management of this condition.   Differential diagnosis includes but is not limited to the following; physiological, bacterial vaginosis, trichomoniasis, yeast infection, cervicitis, pelvic inflammatory disease, and others.  Pelvic exam Significant for vaginal discharge, it was thick, malodorous, and was tender.  Lab testing Pregnancy test negative, urine dipstick nonremarkable, cytology obtained, along with HIV and RPR.  Imaging Not indicated  The most likely diagnosis is PID secondary to an STD, however further  testing forgonorrhea, chlamydia, Trichomonas, BV, and yeast along with HIV, and syphilis  has been done. Patient has been treated with Rocephin, and azithromycin in clinic, discharged on metronidazole, and doxycycline recommend follow up with Health department in one week for rescreeningtient will be notified of these results in 3-5 business days if positive. Counseling provided on safe sex practices, recommend abstinence, or use of a barrier method for at least 7 days.     Barnet Glasgow, NP 12/21/16 2110

## 2016-12-22 LAB — HIV ANTIBODY (ROUTINE TESTING W REFLEX): HIV Screen 4th Generation wRfx: NONREACTIVE

## 2016-12-22 LAB — POCT URINALYSIS DIP (DEVICE)
BILIRUBIN URINE: NEGATIVE
GLUCOSE, UA: NEGATIVE mg/dL
Hgb urine dipstick: NEGATIVE
Ketones, ur: NEGATIVE mg/dL
LEUKOCYTES UA: NEGATIVE
Nitrite: NEGATIVE
Protein, ur: NEGATIVE mg/dL
Specific Gravity, Urine: 1.025 (ref 1.005–1.030)
UROBILINOGEN UA: 0.2 mg/dL (ref 0.0–1.0)
pH: 5 (ref 5.0–8.0)

## 2016-12-22 LAB — POCT PREGNANCY, URINE: Preg Test, Ur: NEGATIVE

## 2016-12-22 LAB — RPR: RPR Ser Ql: NONREACTIVE

## 2016-12-23 ENCOUNTER — Encounter: Payer: Self-pay | Admitting: Family Medicine

## 2016-12-23 ENCOUNTER — Emergency Department (HOSPITAL_COMMUNITY)
Admission: EM | Admit: 2016-12-23 | Discharge: 2016-12-23 | Disposition: A | Discharge status: Home / Self Care | Payer: Medicaid Other | Attending: Emergency Medicine | Admitting: Emergency Medicine

## 2016-12-23 ENCOUNTER — Telehealth: Payer: Self-pay | Admitting: Internal Medicine

## 2016-12-23 ENCOUNTER — Encounter (HOSPITAL_COMMUNITY): Payer: Self-pay

## 2016-12-23 DIAGNOSIS — E039 Hypothyroidism, unspecified: Secondary | ICD-10-CM | POA: Diagnosis not present

## 2016-12-23 DIAGNOSIS — Z7984 Long term (current) use of oral hypoglycemic drugs: Secondary | ICD-10-CM | POA: Insufficient documentation

## 2016-12-23 DIAGNOSIS — B373 Candidiasis of vulva and vagina: Secondary | ICD-10-CM

## 2016-12-23 DIAGNOSIS — Z79899 Other long term (current) drug therapy: Secondary | ICD-10-CM | POA: Insufficient documentation

## 2016-12-23 DIAGNOSIS — I10 Essential (primary) hypertension: Secondary | ICD-10-CM | POA: Diagnosis not present

## 2016-12-23 DIAGNOSIS — B379 Candidiasis, unspecified: Secondary | ICD-10-CM

## 2016-12-23 DIAGNOSIS — R51 Headache: Secondary | ICD-10-CM | POA: Insufficient documentation

## 2016-12-23 DIAGNOSIS — R519 Headache, unspecified: Secondary | ICD-10-CM

## 2016-12-23 DIAGNOSIS — E119 Type 2 diabetes mellitus without complications: Secondary | ICD-10-CM | POA: Insufficient documentation

## 2016-12-23 DIAGNOSIS — B3731 Acute candidiasis of vulva and vagina: Secondary | ICD-10-CM

## 2016-12-23 LAB — CERVICOVAGINAL ANCILLARY ONLY
Bacterial vaginitis: NEGATIVE
Candida vaginitis: POSITIVE — AB
Chlamydia: NEGATIVE
Neisseria Gonorrhea: NEGATIVE
TRICH (WINDOWPATH): NEGATIVE

## 2016-12-23 MED ORDER — KETOROLAC TROMETHAMINE 60 MG/2ML IM SOLN
60.0000 mg | Freq: Once | INTRAMUSCULAR | Status: AC
  Administered 2016-12-23: 60 mg via INTRAMUSCULAR
  Filled 2016-12-23: qty 2

## 2016-12-23 MED ORDER — BUTALBITAL-APAP-CAFFEINE 50-325-40 MG PO TABS: 1.0000 | ORAL_TABLET | Freq: Four times a day (QID) | ORAL | 0 refills | Status: AC | PRN

## 2016-12-23 NOTE — ED Notes (Signed)
Pt states she also believes she has a UTI d/t "lots of itching down there" and frequently urinating.

## 2016-12-23 NOTE — Telephone Encounter (Signed)
Please let patient know that test for candida (yeast) was positive.  Ok to send rx for fluconazole 150mg  po x 1 dose, repeat dose in 3d, #2 doses no refills.  Recheck or followup with primary care provider for further evaluation if symptoms are not improving.  LM

## 2016-12-23 NOTE — ED Triage Notes (Signed)
Pt reports a headache that started last night. She has a hx of headaches and states she usually has to come to the ER to get a shot of "Toradol in my butt." Pt A&OX4, speaking clear complete sentences, NAD

## 2016-12-23 NOTE — ED Notes (Signed)
ED Provider at bedside. 

## 2016-12-23 NOTE — ED Provider Notes (Signed)
[name]   CSN: [id number] Arrival date & time: 12/23/16  7829   By signing my name below, I, [name], attest that this documentation has been prepared under the direction and in the presence of [name], PA-C Electronically Signed: [name], Scribe. 12/23/16. 9:24 AM.  History   Chief Complaint Chief Complaint  Patient presents with  . Headache    HPI Comments: Renee Steele is a 36 y.o. female with a history of bipolar disorder, who presents to the Emergency Department complaining of a gradual-onset frontal headache that began two days ago. Patient states she developed a severe pressured headache, which she states "I feel as if my head is about to explode". Patient rates the severity of her pain as a 12/10. Patient states when she develops these headaches she usually has a urinary tract infection, which she has been tested for recently and started abx yesterday.  Has had similar headaches in the past. Patient states toradol helped with her headaches in the past .Patient reports associated photophobia and auditory sensitivity. No modifying factors indicated. Patient denies any fever, chills, rash, or visual disturbance.   The history is provided by the patient. No language interpreter was used.    Past Medical History:  Diagnosis Date  . Bacterial vaginosis   . Chlamydia   . Depression   . Diabetes mellitus without complication ([place])   . HPV in female   . Hypertension   . Hypothyroidism   . Morbid obesity ([place])   . OSA on CPAP   . Post traumatic stress disorder (PTSD)   . Urinary tract infection     Patient Active Problem List   Diagnosis Date Noted  . Mixed bipolar I disorder ([place])   . Depression 10/10/2016  . Morbid obesity ([place]) 02/24/2015  . Hypothyroidism 02/24/2015  . PCOS (polycystic ovarian syndrome) 02/24/2015  . Diabetes ([place]) 02/24/2015  . Bipolar 1 disorder, depressed, severe ([place]) 11/24/2014    Past Surgical  History:  Procedure Laterality Date  . TONSILLECTOMY    . WISDOM TOOTH EXTRACTION      OB History    No data available       Home Medications    Prior to Admission medications   Medication Sig Start Date End Date Taking? Authorizing Provider  amLODipine (NORVASC) 5 MG tablet Take 1 tablet (5 mg total) by mouth daily. 12/21/16   Barnet Glasgow, NP  ARIPiprazole (ABILIFY) 10 MG tablet Take 1 tablet (10 mg total) by mouth daily. 10/15/16   Kerrie Buffalo, NP  doxycycline (VIBRAMYCIN) 100 MG capsule Take 1 capsule (100 mg total) by mouth 2 (two) times daily. 12/21/16   Barnet Glasgow, NP  hydrochlorothiazide (HYDRODIURIL) 12.5 MG tablet Take 1 tablet (12.5 mg total) by mouth daily. 12/21/16   Barnet Glasgow, NP  hydrOXYzine (ATARAX/VISTARIL) 25 MG tablet Take 1 tablet (25 mg total) by mouth every 6 (six) hours as needed for anxiety. 10/14/16   Kerrie Buffalo, NP  levothyroxine (SYNTHROID, LEVOTHROID) 50 MCG tablet Take 1 tablet (50 mcg total) by mouth daily. 10/14/16   Kerrie Buffalo, NP  metFORMIN (GLUCOPHAGE) 500 MG tablet Take 1 tablet (500 mg total) by mouth 2 (two) times daily with a meal. 10/14/16   Kerrie Buffalo, NP  metroNIDAZOLE (FLAGYL) 500 MG tablet Take 1 tablet (500 mg total) by mouth 2 (two) times daily. 12/21/16   Barnet Glasgow, NP  pantoprazole (PROTONIX) 40 MG tablet Take 1 tablet (40 mg total) by mouth daily. 10/15/16  Kerrie Buffalo, NP  traZODone (DESYREL) 50 MG tablet Take 1 tablet (50 mg total) by mouth at bedtime as needed for sleep. 10/14/16   Kerrie Buffalo, NP  venlafaxine XR (EFFEXOR-XR) 37.5 MG 24 hr capsule Take 1 capsule (37.5 mg total) by mouth daily with breakfast. 10/15/16   Kerrie Buffalo, NP    Family History Family History  Problem Relation Age of Onset  . Bipolar disorder Father   . Bipolar disorder Mother     Social History Social History  Substance Use Topics  . Smoking status: Never Smoker  . Smokeless tobacco: Never Used  .  Alcohol use Yes     Allergies   Lisinopril and Latex   Review of Systems Review of Systems  Constitutional: Negative for chills and fever.  HENT:       Auditory sensitivity.  Eyes: Positive for photophobia. Negative for visual disturbance.  Skin: Negative for rash.  Neurological: Positive for headaches.  All other systems reviewed and are negative.    Physical Exam Updated Vital Signs BP 131/83 (BP Location: Left Arm)   Pulse 96   Temp 98.4 F (36.9 C) (Oral)   Resp 16   LMP 12/08/2016   SpO2 95%   Physical Exam  Constitutional: She is oriented to person, place, and time. She appears well-developed and well-nourished.  Well-appearing, nontoxic, and obese.   HENT:  Head: Normocephalic and atraumatic.  No sinus tenderness or percussion.   Eyes: EOM are normal. Pupils are equal, round, and reactive to light.  Neck: Normal range of motion. Neck supple.  No nuchal rigidity.  Cardiovascular: Normal rate and regular rhythm.   Pulmonary/Chest: Effort normal and breath sounds normal.  Musculoskeletal: Normal range of motion.  Neurological: She is alert and oriented to person, place, and time. She has normal strength. No cranial nerve deficit or sensory deficit. GCS eye subscore is 4. GCS verbal subscore is 5. GCS motor subscore is 6.  CN III-XII.  Skin: Skin is warm and dry.  Psychiatric: She has a normal mood and affect.  Nursing note and vitals reviewed.    ED Treatments / Results  DIAGNOSTIC STUDIES: Oxygen Saturation is 95% on RA, adequate by my interpretation.    COORDINATION OF CARE: 9:23 AM Discussed treatment plan with pt at bedside and pt agreed to plan, which includes Toradol.   Labs (all labs ordered are listed, but only abnormal results are displayed) Labs Reviewed - No data to display  EKG  EKG Interpretation None       Radiology No results found.  Procedures Procedures (including critical care time)  Medications Ordered in ED Medications  - No data to display   Initial Impression / Assessment and Plan / ED Course  I have reviewed the triage vital signs and the nursing notes.  Pertinent labs & imaging results that were available during my care of the patient were reviewed by me and considered in my medical decision making (see chart for details).     BP 131/83 (BP Location: Left Arm)   Pulse 96   Temp 98.4 F (36.9 C) (Oral)   Resp 16   LMP 12/08/2016   SpO2 95%    Final Clinical Impressions(s) / ED Diagnoses   Final diagnoses:  Bad headache    New Prescriptions New Prescriptions   BUTALBITAL-ACETAMINOPHEN-CAFFEINE (FIORICET, ESGIC) 50-325-40 MG TABLET    Take 1-2 tablets by mouth every 6 (six) hours as needed for headache.   I personally performed the services described in  this documentation, which was scribed in my presence. The recorded information has been reviewed and is accurate.   Headache similar to previous, no fever, neck stiffness, neuro findings or new symptoms to suggest more serious etiology.  I don't think SAH, ICH, meningitis, encephalitis, mass at this time.  No recent trauma.  I don't feel imaging necessary at this time.  Plan to control symptoms.    [name], PA-C 12/23/16 8032    Daleen Bo, MD 12/23/16 [phone]

## 2016-12-23 NOTE — ED Notes (Signed)
Pt departed in NAD.  

## 2016-12-23 NOTE — ED Notes (Signed)
Fluconazole 150 mg po x 1  No refills called in to [facility] on [address]  Per dr Billy Fischer for yeast infection,  12/23/16   22 35

## 2016-12-24 ENCOUNTER — Encounter: Payer: Self-pay | Admitting: Family Medicine

## 2016-12-24 MED ORDER — FLUCONAZOLE 200 MG PO TABS: 200.0000 mg | ORAL_TABLET | Freq: Every day | ORAL | 0 refills | Status: AC

## 2017-04-05 ENCOUNTER — Ambulatory Visit: Payer: Medicaid Other | Attending: Family Medicine | Admitting: Family Medicine

## 2017-04-05 ENCOUNTER — Other Ambulatory Visit: Payer: Self-pay

## 2017-04-05 ENCOUNTER — Encounter: Payer: Self-pay | Admitting: Family Medicine

## 2017-04-05 VITALS — BP 145/91 | HR 80 | Temp 98.0°F | Resp 18 | Ht 68.0 in | Wt >= 6400 oz

## 2017-04-05 DIAGNOSIS — E039 Hypothyroidism, unspecified: Secondary | ICD-10-CM | POA: Insufficient documentation

## 2017-04-05 DIAGNOSIS — Z7984 Long term (current) use of oral hypoglycemic drugs: Secondary | ICD-10-CM | POA: Insufficient documentation

## 2017-04-05 DIAGNOSIS — Z79899 Other long term (current) drug therapy: Secondary | ICD-10-CM | POA: Insufficient documentation

## 2017-04-05 DIAGNOSIS — Z87898 Personal history of other specified conditions: Secondary | ICD-10-CM | POA: Diagnosis not present

## 2017-04-05 DIAGNOSIS — I10 Essential (primary) hypertension: Secondary | ICD-10-CM | POA: Insufficient documentation

## 2017-04-05 DIAGNOSIS — R079 Chest pain, unspecified: Secondary | ICD-10-CM | POA: Insufficient documentation

## 2017-04-05 DIAGNOSIS — Z6841 Body Mass Index (BMI) 40.0 and over, adult: Secondary | ICD-10-CM | POA: Diagnosis not present

## 2017-04-05 DIAGNOSIS — E8881 Metabolic syndrome: Secondary | ICD-10-CM | POA: Insufficient documentation

## 2017-04-05 DIAGNOSIS — M7989 Other specified soft tissue disorders: Secondary | ICD-10-CM | POA: Diagnosis not present

## 2017-04-05 DIAGNOSIS — E282 Polycystic ovarian syndrome: Secondary | ICD-10-CM | POA: Diagnosis not present

## 2017-04-05 DIAGNOSIS — F314 Bipolar disorder, current episode depressed, severe, without psychotic features: Secondary | ICD-10-CM | POA: Diagnosis not present

## 2017-04-05 DIAGNOSIS — R9431 Abnormal electrocardiogram [ECG] [EKG]: Secondary | ICD-10-CM | POA: Diagnosis not present

## 2017-04-05 LAB — POCT GLYCOSYLATED HEMOGLOBIN (HGB A1C): Hemoglobin A1C: 5.5

## 2017-04-05 MED ORDER — VENLAFAXINE HCL ER 37.5 MG PO CP24: 37.5000 mg | ORAL_CAPSULE | Freq: Every day | ORAL | 0 refills | Status: DC

## 2017-04-05 MED ORDER — HYDROCHLOROTHIAZIDE 25 MG PO TABS: 25.0000 mg | ORAL_TABLET | Freq: Every day | ORAL | 0 refills | Status: DC

## 2017-04-05 MED ORDER — AMLODIPINE BESYLATE 10 MG PO TABS: 10.0000 mg | ORAL_TABLET | Freq: Every day | ORAL | 0 refills | Status: DC

## 2017-04-05 MED ORDER — BLOOD PRESSURE CUFF MISC: 1.0000 | Freq: Once | 0 refills | Status: AC

## 2017-04-05 MED ORDER — TRAZODONE HCL 50 MG PO TABS: 50.0000 mg | ORAL_TABLET | Freq: Every evening | ORAL | 0 refills | Status: DC | PRN

## 2017-04-05 MED ORDER — MEDICAL COMPRESSION STOCKINGS MISC: 0 refills | Status: DC

## 2017-04-05 MED ORDER — ARIPIPRAZOLE 10 MG PO TABS: 10.0000 mg | ORAL_TABLET | Freq: Every day | ORAL | 0 refills | Status: DC

## 2017-04-05 NOTE — Progress Notes (Signed)
Patient is here for HTN  

## 2017-04-05 NOTE — Progress Notes (Deleted)
Not taking consistently HTCZ/ amlodipine  2 weeks  HTCZ  Last week  Forgot to take yesterday Swelling in ankles  Sleep apena  1 to 2 years ago  History of  Therapy sessions  Off medications for a while Last time took medicaton 3 months ago  Pyschotherapeutic services    Screen [id number]  Last night chest pain Sharp  Brief   Swelling in legs  2 years ago   Screen  bariaticn

## 2017-04-06 LAB — LIPID PANEL
CHOL/HDL RATIO: 5.3 ratio — AB (ref 0.0–4.4)
CHOLESTEROL TOTAL: 212 mg/dL — AB (ref 100–199)
HDL: 40 mg/dL (ref >39)
LDL CALC: 141 mg/dL — AB (ref 0–99)
TRIGLYCERIDES: 153 mg/dL — AB (ref 0–149)
VLDL CHOLESTEROL CAL: 31 mg/dL (ref 5–40)

## 2017-04-06 LAB — CBC
HEMATOCRIT: 38.6 % (ref 34.0–46.6)
Hemoglobin: 12.7 g/dL (ref 11.1–15.9)
MCH: 25.9 pg — AB (ref 26.6–33.0)
MCHC: 32.9 g/dL (ref 31.5–35.7)
MCV: 79 fL (ref 79–97)
Platelets: 369 10*3/uL (ref 150–379)
RBC: 4.9 x10E6/uL (ref 3.77–5.28)
RDW: 13.2 % (ref 12.3–15.4)
WBC: 5.2 10*3/uL (ref 3.4–10.8)

## 2017-04-06 LAB — CMP AND LIVER
ALK PHOS: 79 IU/L (ref 39–117)
ALT: 18 IU/L (ref 0–32)
AST: 20 IU/L (ref 0–40)
Albumin: 4.4 g/dL (ref 3.5–5.5)
BILIRUBIN TOTAL: 0.6 mg/dL (ref 0.0–1.2)
BILIRUBIN, DIRECT: 0.16 mg/dL (ref 0.00–0.40)
BUN: 9 mg/dL (ref 6–20)
CHLORIDE: 104 mmol/L (ref 96–106)
CO2: 32 mmol/L — AB (ref 20–29)
Calcium: 8.9 mg/dL (ref 8.7–10.2)
Creatinine, Ser: 0.65 mg/dL (ref 0.57–1.00)
GFR calc Af Amer: 131 mL/min/{1.73_m2} (ref >59)
GFR calc non Af Amer: 114 mL/min/{1.73_m2} (ref >59)
Glucose: 100 mg/dL — ABNORMAL HIGH (ref 65–99)
Potassium: 4 mmol/L (ref 3.5–5.2)
Sodium: 146 mmol/L — ABNORMAL HIGH (ref 134–144)
Total Protein: 7 g/dL (ref 6.0–8.5)

## 2017-04-06 LAB — BRAIN NATRIURETIC PEPTIDE: BNP: <2.5 pg/mL (ref 0.0–100.0)

## 2017-04-06 LAB — TSH: TSH: 1.47 u[IU]/mL (ref 0.450–4.500)

## 2017-04-07 ENCOUNTER — Ambulatory Visit (HOSPITAL_COMMUNITY)
Admission: RE | Admit: 2017-04-07 | Discharge: 2017-04-07 | Disposition: A | Discharge status: Home / Self Care | Payer: Medicaid Other | Source: Ambulatory Visit | Attending: Family Medicine | Admitting: Family Medicine

## 2017-04-07 DIAGNOSIS — M7989 Other specified soft tissue disorders: Secondary | ICD-10-CM

## 2017-04-07 DIAGNOSIS — R9431 Abnormal electrocardiogram [ECG] [EKG]: Secondary | ICD-10-CM | POA: Diagnosis not present

## 2017-04-07 NOTE — Progress Notes (Signed)
  Echocardiogram 2D Echocardiogram has been performed.  Renee Steele R 04/07/2017, 11:13 AM

## 2017-04-09 NOTE — Progress Notes (Signed)
Subjective:  Patient ID: Renee Steele, female    DOB: 03-22-1981  Age: 36 y.o. MRN: [id number]  CC: Hypertension   HPI [name] presents for hypertension, hypothyroidism follow-up. History of hypertension. She  is not exercising and is not adherent to low salt diet. She does not check BP at home. She reports not taking blood pressure medications consistently reports restarting hydrochlorothiazide use last week prior to that reports not taking her hydrochlorothiazide and amlodipine for 2 she reports that she forgot to take her blood pressure medication yesterday. Cardiac symptoms lower extremity edema and orthopnea. She reports noticing swelling in the bilateral lower extremities that began 2 years ago. Patient denies chest pain, chest pressure/discomfort, claudication, near-syncope, palpitations and syncope.  Cardiovascular risk factors: hypertension, obesity (BMI >= 30 kg/m2) and sedentary lifestyle. Use of agents associated with hypertension: none. History of target organ damage: none.History of hypothyroidism. denies fatigue, weight changes, heat/cold intolerance, bowel/skin changes.  Previous thyroid studies include TSH. History of PCOS with insulin resistance. She reports taking metformin.  History of morbid obesity. She reports attempting to lose weight through healthy diet and lifestyle changes, with minimal improvement. History of bipolar disorder. She reports not taking her psychiatric medicatons 3 months. She reports beginning outpatient therapy sessions. Denies SI/HI.  Outpatient Medications Prior to Visit  Medication Sig Dispense Refill  . butalbital-acetaminophen-caffeine (FIORICET, ESGIC) 50-325-40 MG tablet Take 1-2 tablets by mouth every 6 (six) hours as needed for headache. 20 tablet 0  . doxycycline (VIBRAMYCIN) 100 MG capsule Take 1 capsule (100 mg total) by mouth 2 (two) times daily. 28 capsule 0  . hydrOXYzine (ATARAX/VISTARIL) 25 MG tablet Take 1 tablet (25 mg total) by  mouth every 6 (six) hours as needed for anxiety. 30 tablet 0  . levothyroxine (SYNTHROID, LEVOTHROID) 50 MCG tablet Take 1 tablet (50 mcg total) by mouth daily. 30 tablet 1  . metFORMIN (GLUCOPHAGE) 500 MG tablet Take 1 tablet (500 mg total) by mouth 2 (two) times daily with a meal. 60 tablet 2  . metroNIDAZOLE (FLAGYL) 500 MG tablet Take 1 tablet (500 mg total) by mouth 2 (two) times daily. 14 tablet 0  . pantoprazole (PROTONIX) 40 MG tablet Take 1 tablet (40 mg total) by mouth daily. 30 tablet 0  . amLODipine (NORVASC) 5 MG tablet Take 1 tablet (5 mg total) by mouth daily. 30 tablet 0  . ARIPiprazole (ABILIFY) 10 MG tablet Take 1 tablet (10 mg total) by mouth daily. 30 tablet 0  . hydrochlorothiazide (HYDRODIURIL) 12.5 MG tablet Take 1 tablet (12.5 mg total) by mouth daily. 30 tablet 2  . traZODone (DESYREL) 50 MG tablet Take 1 tablet (50 mg total) by mouth at bedtime as needed for sleep. 30 tablet 0  . venlafaxine XR (EFFEXOR-XR) 37.5 MG 24 hr capsule Take 1 capsule (37.5 mg total) by mouth daily with breakfast. 30 capsule 0   No facility-administered medications prior to visit.     ROS Review of Systems  Constitutional: Negative.   Eyes: Negative.   Respiratory: Negative.   Cardiovascular: Positive for leg swelling.  Gastrointestinal: Negative.   Endocrine: Negative.   Skin: Negative.   Psychiatric/Behavioral: Negative for suicidal ideas.       History of bipolar disorder.    Objective:  BP (!) 145/91 (BP Location: Left Arm, Patient Position: Sitting, Cuff Size: Normal)   Pulse 80   Temp 98 F (36.7 C) (Oral)   Resp 18   Ht 5\' 8"  (1.727 m)  Wt (!) 437 lb 6.4 oz (198.4 kg)   SpO2 93%   BMI 66.51 kg/m   BP/Weight 04/05/2017 12/23/2016 2/97/9892  Systolic BP 119 417 408  Diastolic BP 91 93 90  Wt. (Lbs) 437.4 - -  BMI 66.51 - -  Some encounter information is confidential and restricted. Go to Review Flowsheets activity to see all data.     Physical Exam    Constitutional: She appears well-developed and well-nourished.  Obese  Eyes: Pupils are equal, round, and reactive to light. Conjunctivae are normal.  Neck: No thyromegaly present.  Cardiovascular: Normal rate, regular rhythm, normal heart sounds and intact distal pulses.   Bilateral lower extremity edema, nonpitting.  Pulmonary/Chest: Effort normal and breath sounds normal.  Abdominal: Soft. Bowel sounds are normal. There is no tenderness.  Skin: Skin is warm and dry.  Psychiatric: Her affect is labile. Her speech is tangential. She expresses no suicidal plans and no homicidal plans.  Nursing note and vitals reviewed.  Assessment & Plan:   1. Hypothyroidism, unspecified type  - TSH  2. Bipolar 1 disorder, depressed, severe ([place])  - Ambulatory referral to Psychiatry - ARIPiprazole (ABILIFY) 10 MG tablet; Take 1 tablet (10 mg total) by mouth daily.  Dispense: 30 tablet; Refill: 0 - traZODone (DESYREL) 50 MG tablet; Take 1 tablet (50 mg total) by mouth at bedtime as needed for sleep.  Dispense: 30 tablet; Refill: 0 - venlafaxine XR (EFFEXOR-XR) 37.5 MG 24 hr capsule; Take 1 capsule (37.5 mg total) by mouth daily with breakfast.  Dispense: 30 capsule; Refill: 0  3. Morbid obesity ([place])  - Amb Referral to Bariatric Surgery - ECHOCARDIOGRAM COMPLETE; Future  4. Essential hypertension  - Lipid Panel - CMP and Liver - Blood Pressure Monitoring (BLOOD PRESSURE CUFF) MISC; 1 Device by Does not apply route once. For blood pressure monitoring.  Dispense: 1 each; Refill: 0 - amLODipine (NORVASC) 10 MG tablet; Take 1 tablet (10 mg total) by mouth daily.  Dispense: 90 tablet; Refill: 0 - hydrochlorothiazide (HYDRODIURIL) 25 MG tablet; Take 1 tablet (25 mg total) by mouth daily.  Dispense: 90 tablet; Refill: 0  5. History of chest pain  - CBC - EKG 12-Lead  6. Leg swelling  - CBC - Brain natriuretic peptide - ECHOCARDIOGRAM COMPLETE; Future - Elastic Bandages & Supports (MEDICAL  COMPRESSION STOCKINGS) MISC; APPLY ONE STOCKING TO EACH BILATERAL LOWER EXTREMITY FOR SWELLING AND SUPPORT. TO BE FITTED BY MEDICAL SUPPLY.  Dispense: 2 each; Refill: 0  7. Insulin resistance  - POCT glycosylated hemoglobin (Hb A1C)  8. Abnormal ECG  - ECHOCARDIOGRAM COMPLETE; Future   Meds ordered this encounter  Medications  . DISCONTD: hydrochlorothiazide (HYDRODIURIL) 25 MG tablet    Sig: Take 1 tablet (25 mg total) by mouth daily.    Dispense:  90 tablet    Refill:  0    Order Specific Question:   Supervising Provider    Answer:   Tresa Garter [id number]  . DISCONTD: amLODipine (NORVASC) 10 MG tablet    Sig: Take 1 tablet (10 mg total) by mouth daily.    Dispense:  90 tablet    Refill:  0    Order Specific Question:   Supervising Provider    Answer:   Tresa Garter [id number]  . DISCONTD: hydrochlorothiazide (HYDRODIURIL) 25 MG tablet    Sig: Take 1 tablet (25 mg total) by mouth daily.    Dispense:  90 tablet    Refill:  0    Order  Specific Question:   Supervising Provider    Answer:   Tresa Garter [id number]  . DISCONTD: venlafaxine XR (EFFEXOR-XR) 37.5 MG 24 hr capsule    Sig: Take 1 capsule (37.5 mg total) by mouth daily with breakfast.    Dispense:  30 capsule    Refill:  0    Order Specific Question:   Supervising Provider    Answer:   Tresa Garter [id number]  . DISCONTD: ARIPiprazole (ABILIFY) 10 MG tablet    Sig: Take 1 tablet (10 mg total) by mouth daily.    Dispense:  30 tablet    Refill:  0    Order Specific Question:   Supervising Provider    Answer:   Tresa Garter [id number]  . DISCONTD: traZODone (DESYREL) 50 MG tablet    Sig: Take 1 tablet (50 mg total) by mouth at bedtime as needed for sleep.    Dispense:  30 tablet    Refill:  0    Order Specific Question:   Supervising Provider    Answer:   Tresa Garter [id number]  . Elastic Bandages & Supports (MEDICAL COMPRESSION STOCKINGS) MISC    Sig: APPLY ONE  STOCKING TO EACH BILATERAL LOWER EXTREMITY FOR SWELLING AND SUPPORT. TO BE FITTED BY MEDICAL SUPPLY.    Dispense:  2 each    Refill:  0    Order Specific Question:   Supervising Provider    Answer:   Tresa Garter [id number]  . Blood Pressure Monitoring (BLOOD PRESSURE CUFF) MISC    Sig: 1 Device by Does not apply route once. For blood pressure monitoring.    Dispense:  1 each    Refill:  0    Order Specific Question:   Supervising Provider    Answer:   Tresa Garter [id number]  . amLODipine (NORVASC) 10 MG tablet    Sig: Take 1 tablet (10 mg total) by mouth daily.    Dispense:  90 tablet    Refill:  0  . ARIPiprazole (ABILIFY) 10 MG tablet    Sig: Take 1 tablet (10 mg total) by mouth daily.    Dispense:  30 tablet    Refill:  0  . hydrochlorothiazide (HYDRODIURIL) 25 MG tablet    Sig: Take 1 tablet (25 mg total) by mouth daily.    Dispense:  90 tablet    Refill:  0  . traZODone (DESYREL) 50 MG tablet    Sig: Take 1 tablet (50 mg total) by mouth at bedtime as needed for sleep.    Dispense:  30 tablet    Refill:  0  . venlafaxine XR (EFFEXOR-XR) 37.5 MG 24 hr capsule    Sig: Take 1 capsule (37.5 mg total) by mouth daily with breakfast.    Dispense:  30 capsule    Refill:  0    Follow-up: Return in about 2 weeks (around 04/19/2017) for Follow up/HTN.   Alfonse Spruce FNP

## 2017-04-12 ENCOUNTER — Other Ambulatory Visit: Payer: Self-pay | Admitting: Family Medicine

## 2017-04-12 DIAGNOSIS — E782 Mixed hyperlipidemia: Secondary | ICD-10-CM

## 2017-04-12 MED ORDER — ATORVASTATIN CALCIUM 20 MG PO TABS: 20.0000 mg | ORAL_TABLET | Freq: Every day | ORAL | 2 refills | Status: DC

## 2017-04-14 ENCOUNTER — Telehealth: Payer: Self-pay

## 2017-04-14 NOTE — Telephone Encounter (Signed)
-----   Message from Alfonse Spruce, [place] sent at 04/12/2017  5:06 PM EDT ----- Kidney function normal Liver function normal Thyroid function normal. When thyroid function is not normal it can cause cardiovascular symptoms. BNP is normal. This can be elevated with heart failure.  Lipid levels were elevated. This can increase your risk of heart disease overtime. You will be prescribed atorvastatin. Start eating a diet low in saturated fat. Limit your intake of fried foods, red meats, and whole milk.  Labs that evaluated your blood cells shows no signs of anemia, acute infection, or inflammation present. Recommend follow up in 3 months.

## 2017-04-14 NOTE — Telephone Encounter (Signed)
Pt came into office requesting a copy of lab results and EKG. Pt states she will come by tomorrow to pick up

## 2017-04-14 NOTE — Telephone Encounter (Signed)
-----   Message from Alfonse Spruce,  sent at 04/12/2017  6:36 PM EDT ----- -Echocardiogram which looks at your heart structure and function showed mild thickening of the left sided wall of the heart. Risk factors for this include obesity and uncontrolled hypertension. Take your medications for blood pressure. Start eating a diet lower in saturated fat. Limit your intake of fried foods, red meats, and whole milk. Increase physical activity. -No fluid around the heart. No leaking heart valves. Hearts pumping ability is normal.

## 2017-04-14 NOTE — Telephone Encounter (Signed)
CMA call regarding lab results   Patient Verify DOB   Patient was aware and understood  

## 2017-04-20 ENCOUNTER — Telehealth: Payer: Self-pay | Admitting: Family Medicine

## 2017-04-20 NOTE — Telephone Encounter (Signed)
[facility] is not taken new medicaid patients until January 2019.

## 2017-05-06 ENCOUNTER — Ambulatory Visit: Payer: Medicare HMO | Attending: Family Medicine | Admitting: Family Medicine

## 2017-05-06 ENCOUNTER — Encounter: Payer: Self-pay | Admitting: Family Medicine

## 2017-05-06 VITALS — BP 123/77 | HR 95 | Temp 98.1°F | Resp 18 | Ht 68.0 in | Wt >= 6400 oz

## 2017-05-06 DIAGNOSIS — Z79899 Other long term (current) drug therapy: Secondary | ICD-10-CM | POA: Insufficient documentation

## 2017-05-06 DIAGNOSIS — Z8669 Personal history of other diseases of the nervous system and sense organs: Secondary | ICD-10-CM

## 2017-05-06 DIAGNOSIS — Z7984 Long term (current) use of oral hypoglycemic drugs: Secondary | ICD-10-CM | POA: Insufficient documentation

## 2017-05-06 DIAGNOSIS — I10 Essential (primary) hypertension: Secondary | ICD-10-CM

## 2017-05-06 DIAGNOSIS — Z6841 Body Mass Index (BMI) 40.0 and over, adult: Secondary | ICD-10-CM | POA: Diagnosis not present

## 2017-05-06 DIAGNOSIS — E039 Hypothyroidism, unspecified: Secondary | ICD-10-CM | POA: Diagnosis not present

## 2017-05-06 DIAGNOSIS — F319 Bipolar disorder, unspecified: Secondary | ICD-10-CM | POA: Insufficient documentation

## 2017-05-06 DIAGNOSIS — G4733 Obstructive sleep apnea (adult) (pediatric): Secondary | ICD-10-CM | POA: Insufficient documentation

## 2017-05-06 NOTE — Patient Instructions (Signed)

## 2017-05-06 NOTE — Telephone Encounter (Signed)
Noted. Patient seen in office today. She reports currently receiving care from [facility].

## 2017-05-06 NOTE — Progress Notes (Signed)
Subjective:  Patient ID: Renee Steele, female    DOB: Oct 21, 1980  Age: 36 y.o. MRN: [id number]  CC: Follow-up   HPI Renee Steele presents for hypertension, hypothyroidism follow-up. History of hypertension. She  is not exercising and is adherent to low salt diet. She does not check BP at home. She reports taking blood pressure medications consistently. Patient denies chest pain, chest pressure/discomfort, claudication, near-syncope, palpitations and syncope.  Cardiovascular risk factors: hypertension, obesity (BMI >= 30 kg/m2) and sedentary lifestyle. Use of agents associated with hypertension: none. She reports eating a keto based diet to help with weight loss. She expresses interest in other weight loss alternatives besides surgery. History of bipolar disorder. She reports receiving management and therapy from [place] and is only taking Vraylar for symptoms. She reports history of OSA. She reports being without her CPAP for over a year. She reports discarding machine after finding roaches in it. She reports new habitation seen then and would like to obtain another CPAP. She reports sleep study done over 2 years ago.     Outpatient Medications Prior to Visit  Medication Sig Dispense Refill  . amLODipine (NORVASC) 10 MG tablet Take 1 tablet (10 mg total) by mouth daily. 90 tablet 0  . ARIPiprazole (ABILIFY) 10 MG tablet Take 1 tablet (10 mg total) by mouth daily. 30 tablet 0  . atorvastatin (LIPITOR) 20 MG tablet Take 1 tablet (20 mg total) by mouth daily. 30 tablet 2  . butalbital-acetaminophen-caffeine (FIORICET, ESGIC) 50-325-40 MG tablet Take 1-2 tablets by mouth every 6 (six) hours as needed for headache. 20 tablet 0  . doxycycline (VIBRAMYCIN) 100 MG capsule Take 1 capsule (100 mg total) by mouth 2 (two) times daily. 28 capsule 0  . Elastic Bandages & Supports (MEDICAL COMPRESSION STOCKINGS) MISC APPLY ONE STOCKING TO EACH BILATERAL LOWER EXTREMITY FOR SWELLING AND  SUPPORT. TO BE FITTED BY MEDICAL SUPPLY. 2 each 0  . hydrochlorothiazide (HYDRODIURIL) 25 MG tablet Take 1 tablet (25 mg total) by mouth daily. 90 tablet 0  . hydrOXYzine (ATARAX/VISTARIL) 25 MG tablet Take 1 tablet (25 mg total) by mouth every 6 (six) hours as needed for anxiety. 30 tablet 0  . levothyroxine (SYNTHROID, LEVOTHROID) 50 MCG tablet Take 1 tablet (50 mcg total) by mouth daily. 30 tablet 1  . metFORMIN (GLUCOPHAGE) 500 MG tablet Take 1 tablet (500 mg total) by mouth 2 (two) times daily with a meal. 60 tablet 2  . metroNIDAZOLE (FLAGYL) 500 MG tablet Take 1 tablet (500 mg total) by mouth 2 (two) times daily. 14 tablet 0  . pantoprazole (PROTONIX) 40 MG tablet Take 1 tablet (40 mg total) by mouth daily. 30 tablet 0  . traZODone (DESYREL) 50 MG tablet Take 1 tablet (50 mg total) by mouth at bedtime as needed for sleep. 30 tablet 0  . venlafaxine XR (EFFEXOR-XR) 37.5 MG 24 hr capsule Take 1 capsule (37.5 mg total) by mouth daily with breakfast. 30 capsule 0   No facility-administered medications prior to visit.     ROS Review of Systems  Constitutional:       Morbid obesity  Eyes: Negative.   Respiratory: Negative.   Gastrointestinal: Negative.   Endocrine: Negative.   Skin: Negative.   Psychiatric/Behavioral: Negative for suicidal ideas.       History of bipolar disorder.    Objective:  BP 131/81 (BP Location: Left Arm, Patient Position: Sitting, Cuff Size: Normal)   Pulse 95   Temp 98.1 F (36.7  C) (Oral)   Resp 18   Ht 5\' 8"  (1.727 m)   Wt (!) 437 lb 3.2 oz (198.3 kg)   SpO2 97%   BMI 66.48 kg/m   BP/Weight 05/06/2017 04/05/2017 6/96/7893  Systolic BP 810 175 102  Diastolic BP 81 91 93  Wt. (Lbs) 437.2 437.4 -  BMI 66.48 66.51 -  Some encounter information is confidential and restricted. Go to Review Flowsheets activity to see all data.     Physical Exam  Constitutional: She appears well-developed and well-nourished.  Obese  Cardiovascular: Normal rate,  regular rhythm, normal heart sounds and intact distal pulses.  Pulmonary/Chest: Effort normal and breath sounds normal.  Abdominal: Soft. Bowel sounds are normal. There is no tenderness.  Skin: Skin is warm and dry.  Psychiatric: She is slowed. She expresses no suicidal plans and no homicidal plans.  Nursing note and vitals reviewed.  Assessment & Plan:   1. Essential hypertension Cont.current BP meds.  2. Morbid obesity ([place]) Pt.not a candidate for prescription medication therapy at this time.  - Amb ref to Medical Nutrition Therapy-MNT  3. History of obstructive sleep apnea  - Nocturnal polysomnography (NPSG); Future   Follow-up: Return in about 3 months (around 08/06/2017) for HTN .   Alfonse Spruce FNP

## 2017-05-13 DIAGNOSIS — Z6841 Body Mass Index (BMI) 40.0 and over, adult: Secondary | ICD-10-CM | POA: Diagnosis not present

## 2017-05-13 DIAGNOSIS — Z124 Encounter for screening for malignant neoplasm of cervix: Secondary | ICD-10-CM | POA: Diagnosis not present

## 2017-05-13 DIAGNOSIS — N911 Secondary amenorrhea: Secondary | ICD-10-CM | POA: Diagnosis not present

## 2017-05-17 ENCOUNTER — Encounter: Payer: Self-pay | Admitting: Family Medicine

## 2017-06-01 ENCOUNTER — Emergency Department (HOSPITAL_COMMUNITY): Payer: Medicare HMO

## 2017-06-01 ENCOUNTER — Emergency Department (HOSPITAL_COMMUNITY)
Admission: EM | Admit: 2017-06-01 | Discharge: 2017-06-01 | Disposition: A | Discharge status: Home / Self Care | Payer: Medicare HMO | Attending: Emergency Medicine | Admitting: Emergency Medicine

## 2017-06-01 ENCOUNTER — Other Ambulatory Visit: Payer: Self-pay

## 2017-06-01 ENCOUNTER — Encounter (HOSPITAL_COMMUNITY): Payer: Self-pay | Admitting: *Deleted

## 2017-06-01 DIAGNOSIS — M545 Low back pain, unspecified: Secondary | ICD-10-CM

## 2017-06-01 DIAGNOSIS — E119 Type 2 diabetes mellitus without complications: Secondary | ICD-10-CM | POA: Insufficient documentation

## 2017-06-01 DIAGNOSIS — Z79899 Other long term (current) drug therapy: Secondary | ICD-10-CM | POA: Diagnosis not present

## 2017-06-01 DIAGNOSIS — E039 Hypothyroidism, unspecified: Secondary | ICD-10-CM | POA: Diagnosis not present

## 2017-06-01 DIAGNOSIS — Z91040 Latex allergy status: Secondary | ICD-10-CM | POA: Diagnosis not present

## 2017-06-01 DIAGNOSIS — I10 Essential (primary) hypertension: Secondary | ICD-10-CM | POA: Insufficient documentation

## 2017-06-01 MED ORDER — CYCLOBENZAPRINE HCL 10 MG PO TABS: 10.0000 mg | ORAL_TABLET | Freq: Two times a day (BID) | ORAL | 0 refills | Status: DC | PRN

## 2017-06-01 MED ORDER — IBUPROFEN 800 MG PO TABS: 800.0000 mg | ORAL_TABLET | Freq: Three times a day (TID) | ORAL | 0 refills | Status: DC

## 2017-06-01 MED ORDER — KETOROLAC TROMETHAMINE 30 MG/ML IJ SOLN: 30.0000 mg | Freq: Once | INTRAMUSCULAR | Status: DC

## 2017-06-01 MED ORDER — KETOROLAC TROMETHAMINE 30 MG/ML IJ SOLN
30.0000 mg | Freq: Once | INTRAMUSCULAR | Status: AC
  Administered 2017-06-01: 30 mg via INTRAMUSCULAR
  Filled 2017-06-01: qty 1

## 2017-06-01 MED ORDER — IBUPROFEN 400 MG PO TABS: 600.0000 mg | ORAL_TABLET | Freq: Once | ORAL | Status: DC
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
## 2017-06-01 NOTE — ED Notes (Signed)
Patient transported to X-ray 

## 2017-06-03 ENCOUNTER — Ambulatory Visit: Payer: Self-pay | Admitting: Registered"

## 2017-06-06 ENCOUNTER — Inpatient Hospital Stay (HOSPITAL_COMMUNITY)
Admission: AD | Admit: 2017-06-06 | Discharge: 2017-06-06 | Disposition: A | Discharge status: Home / Self Care | Payer: Medicare HMO | Source: Ambulatory Visit | Attending: Obstetrics and Gynecology | Admitting: Obstetrics and Gynecology

## 2017-06-06 ENCOUNTER — Encounter (HOSPITAL_COMMUNITY): Payer: Self-pay | Admitting: Emergency Medicine

## 2017-06-06 ENCOUNTER — Other Ambulatory Visit: Payer: Self-pay | Admitting: Obstetrics and Gynecology

## 2017-06-06 DIAGNOSIS — Z7984 Long term (current) use of oral hypoglycemic drugs: Secondary | ICD-10-CM | POA: Insufficient documentation

## 2017-06-06 DIAGNOSIS — I10 Essential (primary) hypertension: Secondary | ICD-10-CM | POA: Insufficient documentation

## 2017-06-06 DIAGNOSIS — E119 Type 2 diabetes mellitus without complications: Secondary | ICD-10-CM | POA: Insufficient documentation

## 2017-06-06 DIAGNOSIS — F4310 Post-traumatic stress disorder, unspecified: Secondary | ICD-10-CM | POA: Insufficient documentation

## 2017-06-06 DIAGNOSIS — N939 Abnormal uterine and vaginal bleeding, unspecified: Secondary | ICD-10-CM | POA: Insufficient documentation

## 2017-06-06 DIAGNOSIS — G4733 Obstructive sleep apnea (adult) (pediatric): Secondary | ICD-10-CM | POA: Diagnosis not present

## 2017-06-06 DIAGNOSIS — D649 Anemia, unspecified: Secondary | ICD-10-CM | POA: Insufficient documentation

## 2017-06-06 DIAGNOSIS — N921 Excessive and frequent menstruation with irregular cycle: Secondary | ICD-10-CM | POA: Diagnosis not present

## 2017-06-06 DIAGNOSIS — Z79899 Other long term (current) drug therapy: Secondary | ICD-10-CM | POA: Diagnosis not present

## 2017-06-06 DIAGNOSIS — N911 Secondary amenorrhea: Secondary | ICD-10-CM | POA: Diagnosis not present

## 2017-06-06 LAB — URINALYSIS, ROUTINE W REFLEX MICROSCOPIC

## 2017-06-06 LAB — CBC
HCT: 36.6 % (ref 36.0–46.0)
Hemoglobin: 11.6 g/dL — ABNORMAL LOW (ref 12.0–15.0)
MCH: 26.2 pg (ref 26.0–34.0)
MCHC: 31.7 g/dL (ref 30.0–36.0)
MCV: 82.8 fL (ref 78.0–100.0)
PLATELETS: 359 10*3/uL (ref 150–400)
RBC: 4.42 MIL/uL (ref 3.87–5.11)
RDW: 13 % (ref 11.5–15.5)
WBC: 7.8 10*3/uL (ref 4.0–10.5)

## 2017-06-06 LAB — URINALYSIS, MICROSCOPIC (REFLEX)

## 2017-06-06 LAB — POCT PREGNANCY, URINE: Preg Test, Ur: NEGATIVE

## 2017-06-06 MED ORDER — KETOROLAC TROMETHAMINE 60 MG/2ML IM SOLN
60.0000 mg | Freq: Once | INTRAMUSCULAR | Status: AC
  Administered 2017-06-06: 60 mg via INTRAMUSCULAR
  Filled 2017-06-06: qty 2

## 2017-06-06 NOTE — MAU Note (Addendum)
Patient presents with starting her period on Wednesday, was light then on Friday became heavy, clots, changing pads every 10 minutes. Cramping.  Had not had a period for about 7 months was given Provera to start it.

## 2017-06-06 NOTE — Discharge Instructions (Signed)
Your hemoglobin is not abnormally low and is expected for this amount of vaginal bleeding.  You do not need surgery or a blood transfusion. Take the ibuprofen around the clock for pain control.  Begin at 11:45 pm tonight. Call the office if the bleeding continues to be heavy this week. Return if your clots become very large or your symptoms become worse. Drink at least 8 8-oz glasses of water every day. Eat healthy meals and snacks. Follow up with your GYN and your PCP as scheduled.

## 2017-06-06 NOTE — MAU Provider Note (Signed)
History     CSN: [id number]  Arrival date and time: 06/06/17 1705   First Provider Initiated Contact with Patient 06/06/17 1755      Chief Complaint  Patient presents with  . Vaginal Bleeding   HPI Renee Steele 36 y.o. Comes to MAU today with heavy vaginal bleeding for 3 days.  Has not had a period in approx 8 months and was given a 10 days of Provera by her GYN at [place].  She begain having vaginal bleeding 4 days ago.  She Is now having heavy bleeding with quarter sized clots and uterine cramping.  Has not had any pain medication today as she did not get her ibuprofen prescription filled yet.  States she has used 2 packages of sanitary pads but they were thin pads.  Has purchased thicker pads now. Has had some dizziness and is worried that she is losing too much blood.  Has a history of HTN but did not take her blood pressure medicines today.  She sees a PCP and her next appointment is in Feb.   OB History    No data available      Past Medical History:  Diagnosis Date  . Bacterial vaginosis   . Chlamydia   . Depression   . Diabetes mellitus without complication ([place])   . HPV in female   . Hypertension   . Hypothyroidism   . Morbid obesity ([place])   . OSA on CPAP   . Post traumatic stress disorder (PTSD)   . Urinary tract infection     Past Surgical History:  Procedure Laterality Date  . MOLE REMOVAL    . TONSILLECTOMY    . WISDOM TOOTH EXTRACTION      Family History  Problem Relation Age of Onset  . Bipolar disorder Father   . Bipolar disorder Mother   . Cancer Mother     Social History   Tobacco Use  . Smoking status: Never Smoker  . Smokeless tobacco: Never Used  Substance Use Topics  . Alcohol use: Yes  . Drug use: No    Allergies:  Allergies  Allergen Reactions  . Lisinopril Shortness Of Breath and Cough  . Latex     Medications Prior to Admission  Medication Sig Dispense Refill Last Dose  . amLODipine (NORVASC) 10 MG tablet  Take 1 tablet (10 mg total) by mouth daily. 90 tablet 0 06/04/2017 at Unknown time  . atorvastatin (LIPITOR) 20 MG tablet Take 1 tablet (20 mg total) by mouth daily. 30 tablet 2 06/03/2017 at Unknown time  . hydrochlorothiazide (HYDRODIURIL) 25 MG tablet Take 1 tablet (25 mg total) by mouth daily. 90 tablet 0 06/01/2017 at Unknown time  . hydrOXYzine (ATARAX/VISTARIL) 25 MG tablet Take 1 tablet (25 mg total) by mouth every 6 (six) hours as needed for anxiety. 30 tablet 0 06/04/2017 at Unknown time  . levothyroxine (SYNTHROID, LEVOTHROID) 50 MCG tablet Take 1 tablet (50 mcg total) by mouth daily. 30 tablet 1 Past Week at Unknown time  . metFORMIN (GLUCOPHAGE) 500 MG tablet Take 1 tablet (500 mg total) by mouth 2 (two) times daily with a meal. 60 tablet 2 06/04/2017 at Unknown time  . PAZEO 0.7 % SOLN INT 1 GTT INTO OU ONCE D PRN  5 Past Month at Unknown time  . VRAYLAR 1.5 MG CAPS TK 1 C PO HS FOR MOOD.  1 06/05/2017 at Unknown time  . butalbital-acetaminophen-caffeine (FIORICET, ESGIC) 50-325-40 MG tablet Take 1-2 tablets by  mouth every 6 (six) hours as needed for headache. 20 tablet 0   . cyclobenzaprine (FLEXERIL) 10 MG tablet Take 1 tablet (10 mg total) by mouth 2 (two) times daily as needed for muscle spasms. (Patient not taking: Reported on 06/06/2017) 20 tablet 0 Not Taking at Unknown time  . doxycycline (VIBRAMYCIN) 100 MG capsule Take 1 capsule (100 mg total) by mouth 2 (two) times daily. 28 capsule 0   . Elastic Bandages & Supports (MEDICAL COMPRESSION STOCKINGS) MISC APPLY ONE STOCKING TO EACH BILATERAL LOWER EXTREMITY FOR SWELLING AND SUPPORT. TO BE FITTED BY MEDICAL SUPPLY. (Patient not taking: Reported on 06/06/2017) 2 each 0 Not Taking at Unknown time  . ibuprofen (ADVIL,MOTRIN) 800 MG tablet Take 1 tablet (800 mg total) by mouth 3 (three) times daily. (Patient not taking: Reported on 06/06/2017) 21 tablet 0 Not Taking at Unknown time  . metroNIDAZOLE (FLAGYL) 500 MG tablet Take 1 tablet (500  mg total) by mouth 2 (two) times daily. 14 tablet 0 More than a month at Unknown time  . pantoprazole (PROTONIX) 40 MG tablet Take 1 tablet (40 mg total) by mouth daily. (Patient not taking: Reported on 06/06/2017) 30 tablet 0 Not Taking at Unknown time  . traZODone (DESYREL) 50 MG tablet Take 1 tablet (50 mg total) by mouth at bedtime as needed for sleep. 30 tablet 0 06/04/2017    Review of Systems  Constitutional: Negative for fever.  Respiratory: Negative for shortness of breath.   Gastrointestinal: Positive for abdominal pain.  Genitourinary: Positive for vaginal bleeding.   Physical Exam   Blood pressure (!) 152/94, pulse 89, temperature 98.2 F (36.8 C), resp. rate 17, height 5\' 8"  (1.727 m), weight (!) 441 lb (200 kg), last menstrual period 06/02/2017.  Physical Exam  Nursing note and vitals reviewed. Constitutional: She is oriented to person, place, and time. She appears well-developed and well-nourished.  Morbidly obese  HENT:  Head: Normocephalic.  Eyes: EOM are normal.  Neck: Neck supple.  Musculoskeletal: Normal range of motion.  Neurological: She is alert and oriented to person, place, and time.  Skin: Skin is warm and dry.  Psychiatric: She has a normal mood and affect.    MAU Course  Procedures Results for orders placed or performed during the hospital encounter of 06/06/17 (from the past 24 hour(s))  Urinalysis, Routine w reflex microscopic     Status: Abnormal   Collection Time: 06/06/17  5:14 PM  Result Value Ref Range   Color, Urine RED (A) YELLOW   APPearance TURBID (A) CLEAR   Specific Gravity, Urine  1.005 - 1.030    TEST NOT REPORTED DUE TO COLOR INTERFERENCE OF URINE PIGMENT   pH  5.0 - 8.0    TEST NOT REPORTED DUE TO COLOR INTERFERENCE OF URINE PIGMENT   Glucose, UA (A) NEGATIVE mg/dL    TEST NOT REPORTED DUE TO COLOR INTERFERENCE OF URINE PIGMENT   Hgb urine dipstick (A) NEGATIVE    TEST NOT REPORTED DUE TO COLOR INTERFERENCE OF URINE PIGMENT    Bilirubin Urine (A) NEGATIVE    TEST NOT REPORTED DUE TO COLOR INTERFERENCE OF URINE PIGMENT   Ketones, ur (A) NEGATIVE mg/dL    TEST NOT REPORTED DUE TO COLOR INTERFERENCE OF URINE PIGMENT   Protein, ur (A) NEGATIVE mg/dL    TEST NOT REPORTED DUE TO COLOR INTERFERENCE OF URINE PIGMENT   Nitrite (A) NEGATIVE    TEST NOT REPORTED DUE TO COLOR INTERFERENCE OF URINE PIGMENT   Leukocytes, UA (  A) NEGATIVE    TEST NOT REPORTED DUE TO COLOR INTERFERENCE OF URINE PIGMENT  Urinalysis, Microscopic (reflex)     Status: Abnormal   Collection Time: 06/06/17  5:14 PM  Result Value Ref Range   RBC / HPF TOO NUMEROUS TO COUNT 0 - 5 RBC/hpf   WBC, UA 0-5 0 - 5 WBC/hpf   Bacteria, UA FEW (A) NONE SEEN   Squamous Epithelial / LPF 0-5 (A) NONE SEEN  Pregnancy, urine POC     Status: None   Collection Time: 06/06/17  5:36 PM  Result Value Ref Range   Preg Test, Ur NEGATIVE NEGATIVE  CBC     Status: Abnormal   Collection Time: 06/06/17  6:24 PM  Result Value Ref Range   WBC 7.8 4.0 - 10.5 K/uL   RBC 4.42 3.87 - 5.11 MIL/uL   Hemoglobin 11.6 (L) 12.0 - 15.0 g/dL   HCT 36.6 36.0 - 46.0 %   MCV 82.8 78.0 - 100.0 fL   MCH 26.2 26.0 - 34.0 pg   MCHC 31.7 30.0 - 36.0 g/dL   RDW 13.0 11.5 - 15.5 %   Platelets 359 150 - 400 K/uL    MDM Given Toradol 60 mg IM for pain. Last HGB was 12.7 two months ago. Discussed plan of care with Dr. Philis Pique.  Will discharge patient.  Assessment and Plan  Abnormal vaginal bleeding with very mild anemia - secondary to taking provera as treatment for secondary amenorrhea  Plan HGB is not abnormally low and is expected for this amount of vaginal bleeding. Take the ibuprofen around the clock. Call the office if the bleeding continues to be heavy this week. Return if your clots become very large or your symptoms become worse. Drink at least 8 8-oz glasses of water every day. Eat healthy meals and snacks. Follow up with your GYN and your PCP as scheduled.  Terri L  Burleson 06/06/2017, 6:55 PM

## 2017-06-23 ENCOUNTER — Encounter (HOSPITAL_BASED_OUTPATIENT_CLINIC_OR_DEPARTMENT_OTHER): Payer: Self-pay

## 2017-07-14 ENCOUNTER — Other Ambulatory Visit: Payer: Self-pay | Admitting: Family Medicine

## 2017-07-16 DIAGNOSIS — F411 Generalized anxiety disorder: Secondary | ICD-10-CM | POA: Diagnosis not present

## 2017-07-16 DIAGNOSIS — F3161 Bipolar disorder, current episode mixed, mild: Secondary | ICD-10-CM | POA: Diagnosis not present

## 2017-07-20 ENCOUNTER — Encounter (HOSPITAL_BASED_OUTPATIENT_CLINIC_OR_DEPARTMENT_OTHER): Payer: Self-pay

## 2017-08-03 DIAGNOSIS — F411 Generalized anxiety disorder: Secondary | ICD-10-CM | POA: Diagnosis not present

## 2017-08-03 DIAGNOSIS — F3161 Bipolar disorder, current episode mixed, mild: Secondary | ICD-10-CM | POA: Diagnosis not present

## 2017-08-06 ENCOUNTER — Ambulatory Visit: Payer: Commercial Managed Care - HMO | Attending: Family Medicine | Admitting: Family Medicine

## 2017-08-06 ENCOUNTER — Encounter: Payer: Self-pay | Admitting: Family Medicine

## 2017-08-06 VITALS — BP 135/85 | HR 96 | Temp 98.0°F | Resp 16 | Wt >= 6400 oz

## 2017-08-06 DIAGNOSIS — Z79899 Other long term (current) drug therapy: Secondary | ICD-10-CM | POA: Diagnosis not present

## 2017-08-06 DIAGNOSIS — Z7984 Long term (current) use of oral hypoglycemic drugs: Secondary | ICD-10-CM | POA: Insufficient documentation

## 2017-08-06 DIAGNOSIS — Z13220 Encounter for screening for lipoid disorders: Secondary | ICD-10-CM | POA: Insufficient documentation

## 2017-08-06 DIAGNOSIS — Z79890 Hormone replacement therapy (postmenopausal): Secondary | ICD-10-CM | POA: Diagnosis not present

## 2017-08-06 DIAGNOSIS — F3160 Bipolar disorder, current episode mixed, unspecified: Secondary | ICD-10-CM | POA: Insufficient documentation

## 2017-08-06 DIAGNOSIS — I10 Essential (primary) hypertension: Secondary | ICD-10-CM | POA: Diagnosis not present

## 2017-08-06 DIAGNOSIS — E282 Polycystic ovarian syndrome: Secondary | ICD-10-CM | POA: Insufficient documentation

## 2017-08-06 DIAGNOSIS — Z8742 Personal history of other diseases of the female genital tract: Secondary | ICD-10-CM

## 2017-08-06 DIAGNOSIS — E8881 Metabolic syndrome: Secondary | ICD-10-CM | POA: Insufficient documentation

## 2017-08-06 DIAGNOSIS — E039 Hypothyroidism, unspecified: Secondary | ICD-10-CM | POA: Diagnosis not present

## 2017-08-06 DIAGNOSIS — E119 Type 2 diabetes mellitus without complications: Secondary | ICD-10-CM | POA: Diagnosis not present

## 2017-08-06 DIAGNOSIS — Z6841 Body Mass Index (BMI) 40.0 and over, adult: Secondary | ICD-10-CM | POA: Insufficient documentation

## 2017-08-06 LAB — POCT GLYCOSYLATED HEMOGLOBIN (HGB A1C): HEMOGLOBIN A1C: 5.1

## 2017-08-06 LAB — GLUCOSE, POCT (MANUAL RESULT ENTRY): POC GLUCOSE: 117 mg/dL — AB (ref 70–99)

## 2017-08-06 NOTE — Progress Notes (Signed)
135/85 

## 2017-08-06 NOTE — Patient Instructions (Signed)
Managing Your Hypertension Hypertension is commonly called high blood pressure. This is when the force of your blood pressing against the walls of your arteries is too strong. Arteries are blood vessels that carry blood from your heart throughout your body. Hypertension forces the heart to work harder to pump blood, and may cause the arteries to become narrow or stiff. Having untreated or uncontrolled hypertension can cause heart attack, stroke, kidney disease, and other problems. What are blood pressure readings? A blood pressure reading consists of a higher number over a lower number. Ideally, your blood pressure should be below 120/80. The first ("top") number is called the systolic pressure. It is a measure of the pressure in your arteries as your heart beats. The second ("bottom") number is called the diastolic pressure. It is a measure of the pressure in your arteries as the heart relaxes. What does my blood pressure reading mean? Blood pressure is classified into four stages. Based on your blood pressure reading, your health care provider may use the following stages to determine what type of treatment you need, if any. Systolic pressure and diastolic pressure are measured in a unit called mm Hg. Normal  Systolic pressure: below 120.  Diastolic pressure: below 80. Elevated  Systolic pressure: 120-129.  Diastolic pressure: below 80. Hypertension stage 1  Systolic pressure: 130-139.  Diastolic pressure: 80-89. Hypertension stage 2  Systolic pressure: 140 or above.  Diastolic pressure: 90 or above. What health risks are associated with hypertension? Managing your hypertension is an important responsibility. Uncontrolled hypertension can lead to:  A heart attack.  A stroke.  A weakened blood vessel (aneurysm).  Heart failure.  Kidney damage.  Eye damage.  Metabolic syndrome.  Memory and concentration problems.  What changes can I make to manage my  hypertension? Hypertension can be managed by making lifestyle changes and possibly by taking medicines. Your health care provider will help you make a plan to bring your blood pressure within a normal range. Eating and drinking  Eat a diet that is high in fiber and potassium, and low in salt (sodium), added sugar, and fat. An example eating plan is called the DASH (Dietary Approaches to Stop Hypertension) diet. To eat this way: ? Eat plenty of fresh fruits and vegetables. Try to fill half of your plate at each meal with fruits and vegetables. ? Eat whole grains, such as whole wheat pasta, brown rice, or whole grain bread. Fill about one quarter of your plate with whole grains. ? Eat low-fat diary products. ? Avoid fatty cuts of meat, processed or cured meats, and poultry with skin. Fill about one quarter of your plate with lean proteins such as fish, chicken without skin, beans, eggs, and tofu. ? Avoid premade and processed foods. These tend to be higher in sodium, added sugar, and fat.  Reduce your daily sodium intake. Most people with hypertension should eat less than 1,500 mg of sodium a day.  Limit alcohol intake to no more than 1 drink a day for nonpregnant women and 2 drinks a day for men. One drink equals 12 oz of beer, 5 oz of wine, or 1 oz of hard liquor. Lifestyle  Work with your health care provider to maintain a healthy body weight, or to lose weight. Ask what an ideal weight is for you.  Get at least 30 minutes of exercise that causes your heart to beat faster (aerobic exercise) most days of the week. Activities may include walking, swimming, or biking.  Include exercise   to strengthen your muscles (resistance exercise), such as weight lifting, as part of your weekly exercise routine. Try to do these types of exercises for 30 minutes at least 3 days a week.  Do not use any products that contain nicotine or tobacco, such as cigarettes and e-cigarettes. If you need help quitting, ask  your health care provider.  Control any long-term (chronic) conditions you have, such as high cholesterol or diabetes. Monitoring  Monitor your blood pressure at home as told by your health care provider. Your personal target blood pressure may vary depending on your medical conditions, your age, and other factors.  Have your blood pressure checked regularly, as often as told by your health care provider. Working with your health care provider  Review all the medicines you take with your health care provider because there may be side effects or interactions.  Talk with your health care provider about your diet, exercise habits, and other lifestyle factors that may be contributing to hypertension.  Visit your health care provider regularly. Your health care provider can help you create and adjust your plan for managing hypertension. Will I need medicine to control my blood pressure? Your health care provider may prescribe medicine if lifestyle changes are not enough to get your blood pressure under control, and if:  Your systolic blood pressure is 130 or higher.  Your diastolic blood pressure is 80 or higher.  Take medicines only as told by your health care provider. Follow the directions carefully. Blood pressure medicines must be taken as prescribed. The medicine does not work as well when you skip doses. Skipping doses also puts you at risk for problems. Contact a health care provider if:  You think you are having a reaction to medicines you have taken.  You have repeated (recurrent) headaches.  You feel dizzy.  You have swelling in your ankles.  You have trouble with your vision. Get help right away if:  You develop a severe headache or confusion.  You have unusual weakness or numbness, or you feel faint.  You have severe pain in your chest or abdomen.  You vomit repeatedly.  You have trouble breathing. Summary  Hypertension is when the force of blood pumping through  your arteries is too strong. If this condition is not controlled, it may put you at risk for serious complications.  Your personal target blood pressure may vary depending on your medical conditions, your age, and other factors. For most people, a normal blood pressure is less than 120/80.  Hypertension is managed by lifestyle changes, medicines, or both. Lifestyle changes include weight loss, eating a healthy, low-sodium diet, exercising more, and limiting alcohol. This information is not intended to replace advice given to you by your health care provider. Make sure you discuss any questions you have with your health care provider. Document Released: 03/16/2012 Document Revised: 05/20/2016 Document Reviewed: 05/20/2016 Elsevier Interactive Patient Education  2018 Elsevier Inc.  

## 2017-08-06 NOTE — Progress Notes (Signed)
Subjective:  Patient ID: Renee Steele, female    DOB: [birth year]  Age: 37 y.o. MRN: [id number]  CC: Diabetes and Hypertension   HPI Renee Steele presents for hypertension, hypothyroidism follow-up. History of hypertension. She  is exercising and is adherent to low salt diet. She does not check BP at home. She reports taking blood pressure medications consistently. Patient denies chest pain, chest pressure/discomfort, claudication, near-syncope, palpitations and syncope.  Cardiovascular risk factors: hypertension, obesity (BMI >= 30 kg/m2) and sedentary lifestyle. Use of agents associated with hypertension: none. She reports trying keto  based diet in the past to help with weight loss. She report working with Physiological scientist twice a week. She expresses interest in bariatric surgery for gastric sleeve. History of bipolar disorder. She reports receiving management and therapy from [facility] next week and is  Vraylar for symptoms.   Outpatient Medications Prior to Visit  Medication Sig Dispense Refill  . hydrOXYzine (ATARAX/VISTARIL) 25 MG tablet Take 1 tablet (25 mg total) by mouth every 6 (six) hours as needed for anxiety. 30 tablet 0  . ibuprofen (ADVIL,MOTRIN) 800 MG tablet Take 1 tablet (800 mg total) by mouth 3 (three) times daily. 21 tablet 0  . metFORMIN (GLUCOPHAGE) 500 MG tablet Take 1 tablet (500 mg total) by mouth 2 (two) times daily with a meal. 60 tablet 2  . amLODipine (NORVASC) 10 MG tablet Take 1 tablet (10 mg total) by mouth daily. 90 tablet 0  . atorvastatin (LIPITOR) 20 MG tablet Take 1 tablet (20 mg total) by mouth daily. 30 tablet 2  . hydrochlorothiazide (HYDRODIURIL) 25 MG tablet Take 1 tablet (25 mg total) by mouth daily. 90 tablet 0  . levothyroxine (SYNTHROID, LEVOTHROID) 50 MCG tablet Take 1 tablet (50 mcg total) by mouth daily. 30 tablet 1  . butalbital-acetaminophen-caffeine (FIORICET, ESGIC) 50-325-40 MG tablet Take 1-2 tablets by mouth every 6 (six)  hours as needed for headache. (Patient not taking: Reported on 08/06/2017) 20 tablet 0  . Elastic Bandages & Supports (MEDICAL COMPRESSION STOCKINGS) MISC APPLY ONE STOCKING TO EACH BILATERAL LOWER EXTREMITY FOR SWELLING AND SUPPORT. TO BE FITTED BY MEDICAL SUPPLY. (Patient not taking: Reported on 06/06/2017) 2 each 0  . metroNIDAZOLE (FLAGYL) 500 MG tablet Take 1 tablet (500 mg total) by mouth 2 (two) times daily. (Patient not taking: Reported on 08/06/2017) 14 tablet 0  . pantoprazole (PROTONIX) 40 MG tablet Take 1 tablet (40 mg total) by mouth daily. (Patient not taking: Reported on 06/06/2017) 30 tablet 0  . PAZEO 0.7 % SOLN INT 1 GTT INTO OU ONCE D PRN  5  . traZODone (DESYREL) 50 MG tablet Take 1 tablet (50 mg total) by mouth at bedtime as needed for sleep. (Patient not taking: Reported on 08/06/2017) 30 tablet 0  . VRAYLAR 1.5 MG CAPS TK 1 C PO HS FOR MOOD.  1   No facility-administered medications prior to visit.     ROS Review of Systems  Constitutional: Negative.   Eyes: Negative for visual disturbance.  Respiratory: Negative.   Cardiovascular: Negative.   Gastrointestinal: Negative.   Skin: Negative.   Psychiatric/Behavioral: Negative for suicidal ideas.       History of bipolar disorder   Objective:  BP 135/85   Pulse 96   Temp 98 F (36.7 C) (Oral)   Resp 16   Wt (!) 439 lb 12.8 oz (199.5 kg)   SpO2 95%   BMI 66.87 kg/m   BP/Weight 08/06/2017 06/06/2017 36/64/4034  Systolic  BP 737 106 269  Diastolic BP 85 62 88  Wt. (Lbs) 439.8 441 -  BMI 66.87 67.05 -  Some encounter information is confidential and restricted. Go to Review Flowsheets activity to see all data.     Physical Exam  Constitutional: She appears well-developed and well-nourished.  obese  Eyes: Conjunctivae are normal. Pupils are equal, round, and reactive to light.  Cardiovascular: Normal rate, regular rhythm, normal heart sounds and intact distal pulses.  Pulmonary/Chest: Effort normal and breath sounds  normal.  Abdominal: Soft. Bowel sounds are normal.  Skin: Skin is warm and dry.  Psychiatric: Her affect is blunt. She expresses no homicidal and no suicidal ideation. She expresses no suicidal plans and no homicidal plans. She is communicative. She is attentive.  Nursing note and vitals reviewed.   Assessment & Plan:   1. Essential hypertension -Continue current medications.  2. Insulin resistance  - Glucose (CBG) - HgB A1c  3. History of PCOS  - Glucose (CBG) - HgB A1c  4. Hypothyroidism, unspecified type  - TSH  5. Mixed bipolar I disorder ([place]) Continue follow up with specialist.  6. Screening cholesterol level  - Lipid Panel  7. Morbid obesity ([place])  - Amb Referral to Bariatric Surgery    Follow-up: Return in about 3 months (around 11/03/2017) for HTN .   Alfonse Spruce FNP

## 2017-08-07 LAB — LIPID PANEL
Chol/HDL Ratio: 3.4 ratio (ref 0.0–4.4)
Cholesterol, Total: 139 mg/dL (ref 100–199)
HDL: 41 mg/dL
LDL Calculated: 81 mg/dL (ref 0–99)
Triglycerides: 87 mg/dL (ref 0–149)
VLDL Cholesterol Cal: 17 mg/dL (ref 5–40)

## 2017-08-07 LAB — TSH: TSH: 1.63 u[IU]/mL (ref 0.450–4.500)

## 2017-08-09 ENCOUNTER — Encounter: Payer: Self-pay | Admitting: Family Medicine

## 2017-08-09 ENCOUNTER — Telehealth: Payer: Self-pay | Admitting: Family Medicine

## 2017-08-09 ENCOUNTER — Telehealth: Payer: Self-pay

## 2017-08-09 ENCOUNTER — Other Ambulatory Visit: Payer: Self-pay | Admitting: Family Medicine

## 2017-08-09 DIAGNOSIS — E782 Mixed hyperlipidemia: Secondary | ICD-10-CM

## 2017-08-09 DIAGNOSIS — I10 Essential (primary) hypertension: Secondary | ICD-10-CM

## 2017-08-09 DIAGNOSIS — E039 Hypothyroidism, unspecified: Secondary | ICD-10-CM

## 2017-08-09 MED ORDER — ATORVASTATIN CALCIUM 20 MG PO TABS: 20.0000 mg | ORAL_TABLET | Freq: Every day | ORAL | 5 refills | Status: DC

## 2017-08-09 MED ORDER — LEVOTHYROXINE SODIUM 50 MCG PO TABS: 50.0000 ug | ORAL_TABLET | Freq: Every day | ORAL | 5 refills | Status: AC

## 2017-08-09 MED ORDER — AMLODIPINE BESYLATE 10 MG PO TABS: 10.0000 mg | ORAL_TABLET | Freq: Every day | ORAL | 5 refills | Status: DC

## 2017-08-09 MED ORDER — HYDROCHLOROTHIAZIDE 25 MG PO TABS: 25.0000 mg | ORAL_TABLET | Freq: Every day | ORAL | 5 refills | Status: DC

## 2017-08-09 NOTE — Telephone Encounter (Signed)
Pt name and DOB verified. Pt is  aware of message and result note.

## 2017-08-09 NOTE — Telephone Encounter (Signed)
CMA called patient on lab result and PCP advising.    Patient understood.

## 2017-08-09 NOTE — Telephone Encounter (Signed)
Pt name and DOB verified. Patient aware of result and result note.

## 2017-08-09 NOTE — Telephone Encounter (Signed)
Pt called to get the lab result, please call her back

## 2017-08-09 NOTE — Telephone Encounter (Signed)
-----   Message from Alfonse Spruce, [place] sent at 08/09/2017  1:54 PM EST ----- Cholesterol levels are normal on current atorvastatin dosage.  Thyroid function normal Recommend follow up in 6 months.

## 2017-08-10 DIAGNOSIS — F3161 Bipolar disorder, current episode mixed, mild: Secondary | ICD-10-CM | POA: Diagnosis not present

## 2017-08-10 DIAGNOSIS — F411 Generalized anxiety disorder: Secondary | ICD-10-CM | POA: Diagnosis not present

## 2017-08-13 DIAGNOSIS — F411 Generalized anxiety disorder: Secondary | ICD-10-CM | POA: Diagnosis not present

## 2017-08-13 DIAGNOSIS — F3161 Bipolar disorder, current episode mixed, mild: Secondary | ICD-10-CM | POA: Diagnosis not present

## 2017-08-23 ENCOUNTER — Other Ambulatory Visit: Payer: Self-pay | Admitting: Physician Assistant

## 2017-08-27 ENCOUNTER — Other Ambulatory Visit: Payer: Self-pay | Admitting: Physician Assistant

## 2017-09-10 ENCOUNTER — Other Ambulatory Visit: Payer: Self-pay | Admitting: Physician Assistant

## 2017-09-13 DIAGNOSIS — F3161 Bipolar disorder, current episode mixed, mild: Secondary | ICD-10-CM | POA: Diagnosis not present

## 2017-09-13 DIAGNOSIS — F411 Generalized anxiety disorder: Secondary | ICD-10-CM | POA: Diagnosis not present

## 2017-09-28 DIAGNOSIS — E039 Hypothyroidism, unspecified: Secondary | ICD-10-CM

## 2017-10-01 ENCOUNTER — Encounter: Payer: Self-pay | Admitting: Family Medicine

## 2017-10-01 ENCOUNTER — Encounter: Payer: Self-pay | Admitting: Urgent Care

## 2017-10-01 ENCOUNTER — Telehealth: Payer: Self-pay | Admitting: Family Medicine

## 2017-10-01 NOTE — Telephone Encounter (Signed)
Please preauthorize patients sleep study at your earliest convenience. Give kia a call back once preauthorization is complete. Kia [phone]

## 2017-10-15 ENCOUNTER — Encounter: Payer: Self-pay | Admitting: Family Medicine

## 2017-10-15 ENCOUNTER — Encounter: Payer: Self-pay | Admitting: Urgent Care

## 2017-10-15 ENCOUNTER — Telehealth: Payer: Self-pay | Admitting: Family Medicine

## 2017-10-15 NOTE — Telephone Encounter (Signed)
This patient was referred for a sleep study by [facility], could you please perform the prior authorization? Could you also provide her with information regarding her Bariatric surgery referral? Thanks.

## 2017-10-15 NOTE — Telephone Encounter (Signed)
Renee Steele is taking care of it. There is a problem with her insurance card so we can not initiate the authorization   Patient is already aware of the situation

## 2017-10-25 ENCOUNTER — Encounter: Payer: Self-pay | Admitting: Family Medicine

## 2017-10-26 NOTE — Telephone Encounter (Signed)
Patient called requesting that she hear from [place] about her sleep study. Spoke with [place] and she had me inform the patient that she would My Chart message her at lunch or after.

## 2017-11-02 DIAGNOSIS — G4730 Sleep apnea, unspecified: Secondary | ICD-10-CM | POA: Diagnosis present

## 2017-11-02 DIAGNOSIS — I10 Essential (primary) hypertension: Secondary | ICD-10-CM | POA: Diagnosis present

## 2017-11-02 DIAGNOSIS — E785 Hyperlipidemia, unspecified: Secondary | ICD-10-CM | POA: Diagnosis present

## 2017-11-02 DIAGNOSIS — Z6841 Body Mass Index (BMI) 40.0 and over, adult: Secondary | ICD-10-CM | POA: Diagnosis not present

## 2017-11-03 ENCOUNTER — Ambulatory Visit: Payer: Medicare HMO | Attending: Family Medicine | Admitting: Family Medicine

## 2017-11-03 ENCOUNTER — Encounter: Payer: Self-pay | Admitting: Family Medicine

## 2017-11-03 VITALS — BP 125/93 | HR 98 | Temp 98.7°F | Resp 16

## 2017-11-03 DIAGNOSIS — E119 Type 2 diabetes mellitus without complications: Secondary | ICD-10-CM | POA: Diagnosis not present

## 2017-11-03 DIAGNOSIS — F314 Bipolar disorder, current episode depressed, severe, without psychotic features: Secondary | ICD-10-CM

## 2017-11-03 DIAGNOSIS — E282 Polycystic ovarian syndrome: Secondary | ICD-10-CM | POA: Diagnosis not present

## 2017-11-03 DIAGNOSIS — G4733 Obstructive sleep apnea (adult) (pediatric): Secondary | ICD-10-CM | POA: Diagnosis not present

## 2017-11-03 DIAGNOSIS — F319 Bipolar disorder, unspecified: Secondary | ICD-10-CM | POA: Diagnosis not present

## 2017-11-03 DIAGNOSIS — I10 Essential (primary) hypertension: Secondary | ICD-10-CM

## 2017-11-03 DIAGNOSIS — E039 Hypothyroidism, unspecified: Secondary | ICD-10-CM | POA: Diagnosis not present

## 2017-11-03 DIAGNOSIS — G4739 Other sleep apnea: Secondary | ICD-10-CM | POA: Diagnosis not present

## 2017-11-03 DIAGNOSIS — Z7984 Long term (current) use of oral hypoglycemic drugs: Secondary | ICD-10-CM | POA: Insufficient documentation

## 2017-11-03 DIAGNOSIS — Z6841 Body Mass Index (BMI) 40.0 and over, adult: Secondary | ICD-10-CM | POA: Diagnosis not present

## 2017-11-03 DIAGNOSIS — Z888 Allergy status to other drugs, medicaments and biological substances status: Secondary | ICD-10-CM | POA: Insufficient documentation

## 2017-11-03 DIAGNOSIS — Z79899 Other long term (current) drug therapy: Secondary | ICD-10-CM | POA: Insufficient documentation

## 2017-11-03 DIAGNOSIS — F4310 Post-traumatic stress disorder, unspecified: Secondary | ICD-10-CM | POA: Insufficient documentation

## 2017-11-03 MED ORDER — METFORMIN HCL 500 MG PO TABS: 500.0000 mg | ORAL_TABLET | Freq: Two times a day (BID) | ORAL | 3 refills | Status: DC

## 2017-11-03 NOTE — Progress Notes (Signed)
Subjective:  Patient ID: Renee Steele, female    DOB: 1981/05/30  Age: 37 y.o. MRN: [id number]  CC: Establish Care and Referral   HPI Renee Steele is a 37 year old female with a history of morbid obesity, PCOS, hypertension, bipolar disorder, hypothyroidism who presents today to establish care with me. She is doing well on her antihypertensive and denies any adverse effects and also tolerating her levothyroxine which she takes for hypothyroidism. For her bipolar disorder she remains on Vraylar is requesting a referral back to [facility] where she receives mental care as her insurance requests a new referral.  She does have a history of sleep apnea and complains of snoring at night, insomnia, daytime somnolence, headaches, fatigue and would like to be referred for sleep study.  She currently does not have a CPAP machine.  For her PCOS she remains on metformin; her A1c is 5.1. She was recently seen by bariatric surgeon and is being worked up for surgery. Denies chest pains, shortness of breath  Past Medical History:  Diagnosis Date  . Bacterial vaginosis   . Chlamydia   . Depression   . Diabetes mellitus without complication ([place])   . HPV in female   . Hypertension   . Hypothyroidism   . Morbid obesity ([place])   . OSA on CPAP   . Post traumatic stress disorder (PTSD)   . Urinary tract infection     Past Surgical History:  Procedure Laterality Date  . MOLE REMOVAL    . TONSILLECTOMY    . WISDOM TOOTH EXTRACTION      Allergies  Allergen Reactions  . Lisinopril Shortness Of Breath and Cough  . Latex      Outpatient Medications Prior to Visit  Medication Sig Dispense Refill  . amLODipine (NORVASC) 10 MG tablet Take 1 tablet (10 mg total) by mouth daily. 30 tablet 5  . hydrochlorothiazide (HYDRODIURIL) 25 MG tablet Take 1 tablet (25 mg total) by mouth daily. 30 tablet 5  . hydrOXYzine (ATARAX/VISTARIL) 25 MG tablet Take 1 tablet (25 mg total) by mouth every 6 (six)  hours as needed for anxiety. 30 tablet 0  . ibuprofen (ADVIL,MOTRIN) 800 MG tablet Take 1 tablet (800 mg total) by mouth 3 (three) times daily. 21 tablet 0  . levothyroxine (SYNTHROID, LEVOTHROID) 50 MCG tablet Take 1 tablet (50 mcg total) by mouth daily. 30 tablet 5  . PAZEO 0.7 % SOLN INT 1 GTT INTO OU ONCE D PRN  5  . VRAYLAR 1.5 MG CAPS TK 1 C PO HS FOR MOOD.  1  . metFORMIN (GLUCOPHAGE) 500 MG tablet Take 1 tablet (500 mg total) by mouth 2 (two) times daily with a meal. 60 tablet 2  . atorvastatin (LIPITOR) 20 MG tablet Take 1 tablet (20 mg total) by mouth daily. (Patient not taking: Reported on 11/03/2017) 30 tablet 5  . butalbital-acetaminophen-caffeine (FIORICET, ESGIC) 50-325-40 MG tablet Take 1-2 tablets by mouth every 6 (six) hours as needed for headache. (Patient not taking: Reported on 08/06/2017) 20 tablet 0  . Elastic Bandages & Supports (MEDICAL COMPRESSION STOCKINGS) MISC APPLY ONE STOCKING TO EACH BILATERAL LOWER EXTREMITY FOR SWELLING AND SUPPORT. TO BE FITTED BY MEDICAL SUPPLY. (Patient not taking: Reported on 06/06/2017) 2 each 0  . metroNIDAZOLE (FLAGYL) 500 MG tablet Take 1 tablet (500 mg total) by mouth 2 (two) times daily. (Patient not taking: Reported on 08/06/2017) 14 tablet 0  . pantoprazole (PROTONIX) 40 MG tablet Take 1 tablet (40 mg total)  by mouth daily. (Patient not taking: Reported on 06/06/2017) 30 tablet 0  . traZODone (DESYREL) 50 MG tablet Take 1 tablet (50 mg total) by mouth at bedtime as needed for sleep. (Patient not taking: Reported on 08/06/2017) 30 tablet 0   No facility-administered medications prior to visit.     ROS Review of Systems  Constitutional: Positive for fatigue. Negative for activity change and appetite change.  HENT: Negative for congestion, sinus pressure and sore throat.   Eyes: Negative for visual disturbance.  Respiratory: Negative for cough, chest tightness, shortness of breath and wheezing.   Cardiovascular: Negative for chest pain and  palpitations.  Gastrointestinal: Negative for abdominal distention, abdominal pain and constipation.  Endocrine: Negative for polydipsia.  Genitourinary: Negative for dysuria and frequency.  Musculoskeletal: Negative for arthralgias and back pain.  Skin: Negative for rash.  Neurological: Positive for headaches. Negative for tremors, light-headedness and numbness.  Hematological: Does not bruise/bleed easily.  Psychiatric/Behavioral: Positive for sleep disturbance. Negative for agitation and behavioral problems.    Objective:  BP (!) 125/93 (BP Location: Right Arm, Patient Position: Sitting, Cuff Size: Large)   Pulse 98   Temp 98.7 F (37.1 C) (Oral)   Resp 16   SpO2 93%   BP/Weight 11/03/2017 08/06/2017 67/07/2456  Systolic BP 099 833 825  Diastolic BP 93 85 62  Wt. (Lbs) - 439.8 441  BMI - 66.87 67.05  Some encounter information is confidential and restricted. Go to Review Flowsheets activity to see all data.      Physical Exam  Constitutional: She is oriented to person, place, and time. She appears well-developed and well-nourished.  Morbidly obese  Cardiovascular: Normal rate, normal heart sounds and intact distal pulses.  No murmur heard. Pulmonary/Chest: Effort normal and breath sounds normal. She has no wheezes. She has no rales. She exhibits no tenderness.  Abdominal: Soft. Bowel sounds are normal. She exhibits no distension and no mass. There is no tenderness.  Musculoskeletal: Normal range of motion.  Neurological: She is alert and oriented to person, place, and time.  Skin: Skin is warm and dry.     Assessment & Plan:   1. PCOS (polycystic ovarian syndrome) - metFORMIN (GLUCOPHAGE) 500 MG tablet; Take 1 tablet (500 mg total) by mouth 2 (two) times daily with a meal.  Dispense: 60 tablet; Refill: 3  2. Hypothyroidism, unspecified type Controlled Continue medications  3. Bipolar 1 disorder, depressed, severe ([place]) Continue Vraylar Currently being managed by  [facility] We will place referral - Ambulatory referral to Psychiatry  4. Other sleep apnea Symptoms point towards sleep apnea will order a sleep study after which CPAP order will be written - Split night study; Future  5. Essential hypertension Slight diastolic elevation No regimen change Continue lifestyle modifications, low sodium, DASH diet She is being worked up for bariatric procedure.   Meds ordered this encounter  Medications  . metFORMIN (GLUCOPHAGE) 500 MG tablet    Sig: Take 1 tablet (500 mg total) by mouth 2 (two) times daily with a meal.    Dispense:  60 tablet    Refill:  3    Follow-up: Return in about 3 months (around 02/03/2018) for follow up of chronic medical conditions.   Charlott Rakes MD

## 2017-11-03 NOTE — Patient Instructions (Signed)

## 2017-11-03 NOTE — Progress Notes (Signed)
Pt. Is here to establish care for hypertension. Pt. Stated she would like to talk about psychiatrist referrals with PCP.

## 2017-11-08 ENCOUNTER — Telehealth: Payer: Self-pay | Admitting: Family Medicine

## 2017-11-08 NOTE — Telephone Encounter (Signed)
Terry from the sleep center called to let PCP know that the order that was put in for the sleep study needs prior auth and since the patient has [employer] the PCP needs to start the authorization. Please follow up.

## 2017-11-12 NOTE — Telephone Encounter (Signed)
Prior Auth been made.  Authorization number: [id number] Spoke to Isabell Jarvis and inform her Authorization number

## 2017-11-15 NOTE — Telephone Encounter (Signed)
Patient called stating that the referral for Psychiatry was sent to the wrong location. Patient would like to be referred to [facility] [address], [place], [place] [address] [phone]  Please f/u

## 2017-11-15 NOTE — Telephone Encounter (Signed)
Hi Alinda Sierras, could you please address this? Thanks.

## 2017-11-16 NOTE — Telephone Encounter (Signed)
Noted  

## 2017-11-25 ENCOUNTER — Encounter: Payer: Self-pay | Admitting: Family Medicine

## 2017-12-05 ENCOUNTER — Encounter: Payer: Self-pay | Admitting: Family Medicine

## 2017-12-13 ENCOUNTER — Telehealth: Payer: Self-pay | Admitting: Family Medicine

## 2017-12-13 DIAGNOSIS — F411 Generalized anxiety disorder: Secondary | ICD-10-CM | POA: Diagnosis not present

## 2017-12-13 DIAGNOSIS — F3161 Bipolar disorder, current episode mixed, mild: Secondary | ICD-10-CM | POA: Diagnosis not present

## 2017-12-13 NOTE — Telephone Encounter (Signed)
Patient called requesting that you send the referral to [facility] [address], [place], [place] [address] We cannot see these referrals because they go straight there when you place them.

## 2017-12-13 NOTE — Telephone Encounter (Signed)
You might have to fax over the referral manually along with office notes  as per her request as [facility] does not provide the option for private psychiatrist.  There are only options for [facility] practices.

## 2017-12-14 NOTE — Telephone Encounter (Signed)
I just faxed the referral to [facility] [address], [place], [place] [address] at [phone] once they get her schedule I will let you know.

## 2017-12-17 ENCOUNTER — Other Ambulatory Visit: Payer: Self-pay

## 2017-12-17 DIAGNOSIS — E782 Mixed hyperlipidemia: Secondary | ICD-10-CM

## 2017-12-17 MED ORDER — ATORVASTATIN CALCIUM 20 MG PO TABS: 20.0000 mg | ORAL_TABLET | Freq: Every day | ORAL | 3 refills | Status: AC

## 2017-12-20 ENCOUNTER — Encounter (HOSPITAL_BASED_OUTPATIENT_CLINIC_OR_DEPARTMENT_OTHER): Payer: Self-pay

## 2017-12-27 ENCOUNTER — Telehealth: Payer: Self-pay | Admitting: Family Medicine

## 2017-12-27 NOTE — Telephone Encounter (Signed)
Patient was called and informed that medication was sent to [facility] on [address].

## 2017-12-27 NOTE — Telephone Encounter (Signed)
Medication refill requested atorvastatin (LIPITOR) 20 MG tablet [[id number]]

## 2018-01-11 ENCOUNTER — Encounter (HOSPITAL_BASED_OUTPATIENT_CLINIC_OR_DEPARTMENT_OTHER): Payer: Self-pay

## 2018-03-02 DIAGNOSIS — F411 Generalized anxiety disorder: Secondary | ICD-10-CM | POA: Diagnosis not present

## 2018-03-02 DIAGNOSIS — F3161 Bipolar disorder, current episode mixed, mild: Secondary | ICD-10-CM | POA: Diagnosis not present

## 2018-03-03 ENCOUNTER — Other Ambulatory Visit: Payer: Self-pay | Admitting: Physician Assistant

## 2018-03-03 DIAGNOSIS — I10 Essential (primary) hypertension: Secondary | ICD-10-CM

## 2018-03-03 MED ORDER — AMLODIPINE BESYLATE 10 MG PO TABS: 10.0000 mg | ORAL_TABLET | Freq: Every day | ORAL | 0 refills | Status: AC

## 2018-03-09 DIAGNOSIS — Z6841 Body Mass Index (BMI) 40.0 and over, adult: Secondary | ICD-10-CM | POA: Diagnosis not present

## 2018-03-09 DIAGNOSIS — L292 Pruritus vulvae: Secondary | ICD-10-CM | POA: Diagnosis not present

## 2018-03-09 DIAGNOSIS — Z113 Encounter for screening for infections with a predominantly sexual mode of transmission: Secondary | ICD-10-CM | POA: Diagnosis not present

## 2018-03-10 DIAGNOSIS — F411 Generalized anxiety disorder: Secondary | ICD-10-CM | POA: Diagnosis not present

## 2018-03-10 DIAGNOSIS — F3161 Bipolar disorder, current episode mixed, mild: Secondary | ICD-10-CM | POA: Diagnosis not present

## 2018-03-24 DIAGNOSIS — F3161 Bipolar disorder, current episode mixed, mild: Secondary | ICD-10-CM | POA: Diagnosis not present

## 2018-03-24 DIAGNOSIS — F411 Generalized anxiety disorder: Secondary | ICD-10-CM | POA: Diagnosis not present

## 2018-03-28 LAB — HM DIABETES EYE EXAM

## 2018-04-01 DIAGNOSIS — F3161 Bipolar disorder, current episode mixed, mild: Secondary | ICD-10-CM | POA: Diagnosis not present

## 2018-04-01 DIAGNOSIS — F411 Generalized anxiety disorder: Secondary | ICD-10-CM | POA: Diagnosis not present

## 2018-04-04 LAB — HM DIABETES EYE EXAM

## 2018-04-06 DIAGNOSIS — F3161 Bipolar disorder, current episode mixed, mild: Secondary | ICD-10-CM | POA: Diagnosis not present

## 2018-04-13 ENCOUNTER — Ambulatory Visit: Payer: Self-pay | Admitting: Family Medicine

## 2018-04-21 DIAGNOSIS — I10 Essential (primary) hypertension: Secondary | ICD-10-CM | POA: Diagnosis not present

## 2018-04-21 DIAGNOSIS — G4733 Obstructive sleep apnea (adult) (pediatric): Secondary | ICD-10-CM | POA: Diagnosis not present

## 2018-04-21 DIAGNOSIS — Z79899 Other long term (current) drug therapy: Secondary | ICD-10-CM | POA: Diagnosis not present

## 2018-04-21 DIAGNOSIS — R7303 Prediabetes: Secondary | ICD-10-CM | POA: Diagnosis not present

## 2018-04-21 DIAGNOSIS — Z008 Encounter for other general examination: Secondary | ICD-10-CM | POA: Diagnosis not present

## 2018-04-21 DIAGNOSIS — Z0000 Encounter for general adult medical examination without abnormal findings: Secondary | ICD-10-CM | POA: Diagnosis not present

## 2018-04-21 DIAGNOSIS — Z6841 Body Mass Index (BMI) 40.0 and over, adult: Secondary | ICD-10-CM | POA: Diagnosis not present

## 2018-04-21 DIAGNOSIS — F319 Bipolar disorder, unspecified: Secondary | ICD-10-CM | POA: Diagnosis not present

## 2018-04-25 DIAGNOSIS — R7303 Prediabetes: Secondary | ICD-10-CM | POA: Diagnosis not present

## 2018-04-29 DIAGNOSIS — F3161 Bipolar disorder, current episode mixed, mild: Secondary | ICD-10-CM | POA: Diagnosis not present

## 2018-04-29 DIAGNOSIS — F411 Generalized anxiety disorder: Secondary | ICD-10-CM | POA: Diagnosis not present

## 2018-04-30 ENCOUNTER — Other Ambulatory Visit: Payer: Self-pay | Admitting: Family Medicine

## 2018-04-30 DIAGNOSIS — I10 Essential (primary) hypertension: Secondary | ICD-10-CM

## 2018-06-07 ENCOUNTER — Other Ambulatory Visit: Payer: Self-pay

## 2019-02-14 ENCOUNTER — Other Ambulatory Visit: Payer: Self-pay

## 2019-02-14 ENCOUNTER — Ambulatory Visit
Admission: RE | Admit: 2019-02-14 | Discharge: 2019-02-14 | Disposition: A | Discharge status: Home / Self Care | Payer: Medicare HMO | Source: Ambulatory Visit | Attending: Family | Admitting: Family

## 2019-02-14 ENCOUNTER — Other Ambulatory Visit: Payer: Self-pay | Admitting: Family Medicine

## 2019-02-14 ENCOUNTER — Encounter (HOSPITAL_COMMUNITY): Payer: Self-pay

## 2019-02-14 ENCOUNTER — Ambulatory Visit (HOSPITAL_COMMUNITY)
Admission: EM | Admit: 2019-02-14 | Discharge: 2019-02-14 | Disposition: A | Discharge status: Home / Self Care | Payer: Medicare HMO | Attending: Urgent Care | Admitting: Urgent Care

## 2019-02-14 DIAGNOSIS — Z23 Encounter for immunization: Secondary | ICD-10-CM

## 2019-02-14 DIAGNOSIS — S40811A Abrasion of right upper arm, initial encounter: Secondary | ICD-10-CM

## 2019-02-14 DIAGNOSIS — M25562 Pain in left knee: Secondary | ICD-10-CM

## 2019-02-14 DIAGNOSIS — M25561 Pain in right knee: Secondary | ICD-10-CM

## 2019-02-14 DIAGNOSIS — W268XXA Contact with other sharp object(s), not elsewhere classified, initial encounter: Secondary | ICD-10-CM

## 2019-02-14 MED ORDER — TETANUS-DIPHTH-ACELL PERTUSSIS 5-2.5-18.5 LF-MCG/0.5 IM SUSP
INTRAMUSCULAR | Status: AC
  Filled 2019-02-14: qty 0.5

## 2019-02-14 MED ORDER — TETANUS-DIPHTH-ACELL PERTUSSIS 5-2.5-18.5 LF-MCG/0.5 IM SUSP
0.5000 mL | Freq: Once | INTRAMUSCULAR | Status: AC
  Administered 2019-02-14: 0.5 mL via INTRAMUSCULAR

## 2019-02-14 NOTE — ED Triage Notes (Signed)
Pt scratched her right arm on a rusty nail at a store today and presents to get area checked and get a tetanus.

## 2019-02-14 NOTE — Discharge Instructions (Signed)
Change your dressing twice daily for the next 2 to 4 days.  Once your wound scabs over, there is no need to continue to cover.  If you develop redness, drainage of pus or worsening pain then please return to the clinic for recheck.  Otherwise he can use Tylenol and ibuprofen for pain and inflammation.

## 2019-02-14 NOTE — ED Provider Notes (Addendum)
MRN: [id number] DOB: 08-Oct-1980  Subjective:   Renee Steele is a 38 y.o. female presenting for suffering a right arm injury today.  Patient states that she accidentally made contact with a rusty nail.  She has developed acute sharp pain over the area that has been improving.  She cannot recall her last Tdap, states that it may be close to the 10 years.  No current facility-administered medications for this encounter.   Current Outpatient Medications:  .  amLODipine (NORVASC) 10 MG tablet, Take 1 tablet (10 mg total) by mouth daily. MUST MAKE APPT FOR FURTHER REFILLS, Disp: 30 tablet, Rfl: 0 .  atorvastatin (LIPITOR) 20 MG tablet, Take 1 tablet (20 mg total) by mouth daily., Disp: 30 tablet, Rfl: 3 .  Elastic Bandages & Supports (MEDICAL COMPRESSION STOCKINGS) MISC, APPLY ONE STOCKING TO EACH BILATERAL LOWER EXTREMITY FOR SWELLING AND SUPPORT. TO BE FITTED BY MEDICAL SUPPLY. (Patient not taking: Reported on 06/06/2017), Disp: 2 each, Rfl: 0 .  hydrochlorothiazide (HYDRODIURIL) 25 MG tablet, Take 1 tablet (25 mg total) by mouth daily. MUST MAKE APPT FOR FURTHER REFILLS, Disp: 30 tablet, Rfl: 0 .  hydrOXYzine (ATARAX/VISTARIL) 25 MG tablet, Take 1 tablet (25 mg total) by mouth every 6 (six) hours as needed for anxiety., Disp: 30 tablet, Rfl: 0 .  ibuprofen (ADVIL,MOTRIN) 800 MG tablet, Take 1 tablet (800 mg total) by mouth 3 (three) times daily., Disp: 21 tablet, Rfl: 0 .  levothyroxine (SYNTHROID, LEVOTHROID) 50 MCG tablet, Take 1 tablet (50 mcg total) by mouth daily., Disp: 30 tablet, Rfl: 5 .  metFORMIN (GLUCOPHAGE) 500 MG tablet, Take 1 tablet (500 mg total) by mouth 2 (two) times daily with a meal., Disp: 60 tablet, Rfl: 3 .  PAZEO 0.7 % SOLN, INT 1 GTT INTO OU ONCE D PRN, Disp: , Rfl: 5 .  VRAYLAR 1.5 MG CAPS, TK 1 C PO HS FOR MOOD., Disp: , Rfl: 1   Allergies  Allergen Reactions  . Lisinopril Shortness Of Breath and Cough  . Latex     Past Medical History:  Diagnosis Date  .  Bacterial vaginosis   . Chlamydia   . Depression   . Diabetes mellitus without complication ([place])   . HPV in female   . Hypertension   . Hypothyroidism   . Morbid obesity ([place])   . OSA on CPAP   . Post traumatic stress disorder (PTSD)   . Urinary tract infection      Past Surgical History:  Procedure Laterality Date  . MOLE REMOVAL    . TONSILLECTOMY    . WISDOM TOOTH EXTRACTION      ROS  Objective:   Vitals: BP (!) 152/90 (BP Location: Left Arm)   Pulse (!) 120   Temp 98.9 F (37.2 C) (Oral)   Resp 20   SpO2 97%   Pulse was 104 on recheck by PA-Charde Macfarlane.   Physical Exam Constitutional:      General: She is not in acute distress.    Appearance: She is well-developed. She is obese. She is not toxic-appearing or diaphoretic.  HENT:     Head: Normocephalic and atraumatic.     Nose: Nose normal.     Mouth/Throat:     Mouth: Mucous membranes are moist.     Pharynx: Oropharynx is clear.  Eyes:     General: No scleral icterus.    Extraocular Movements: Extraocular movements intact.     Pupils: Pupils are equal, round, and reactive to light.  Cardiovascular:  Rate and Rhythm: Tachycardia present.  Pulmonary:     Effort: Pulmonary effort is normal.  Musculoskeletal:       Arms:  Skin:    General: Skin is warm and dry.  Neurological:     General: No focal deficit present.     Mental Status: She is alert and oriented to person, place, and time.  Psychiatric:        Mood and Affect: Mood normal.        Behavior: Behavior normal.     Assessment and Plan :   1. Abrasion of right upper arm, initial encounter   2. Need for Tdap vaccination     Wound care provided including cleaning the wound and application of a nonadherent dressing.  Counseled patient on wound care at home.  Recommend that she take Tylenol and ibuprofen for pain and inflammation.  Tdap updated today. Counseled patient on potential for adverse effects with medications prescribed/recommended  today, ER and return-to-clinic precautions discussed, patient verbalized understanding.    Jaynee Eagles, PA-C 02/14/19 1603    Jaynee Eagles, PA-C 02/14/19 [phone]

## 2019-04-06 ENCOUNTER — Encounter (INDEPENDENT_AMBULATORY_CARE_PROVIDER_SITE_OTHER): Payer: Medicare HMO | Admitting: Diagnostic Neuroimaging

## 2019-04-06 ENCOUNTER — Other Ambulatory Visit: Payer: Self-pay

## 2019-04-06 ENCOUNTER — Ambulatory Visit (INDEPENDENT_AMBULATORY_CARE_PROVIDER_SITE_OTHER): Payer: Medicare HMO | Admitting: Diagnostic Neuroimaging

## 2019-04-06 DIAGNOSIS — R200 Anesthesia of skin: Secondary | ICD-10-CM | POA: Diagnosis not present

## 2019-04-06 DIAGNOSIS — Z0289 Encounter for other administrative examinations: Secondary | ICD-10-CM

## 2019-04-06 NOTE — Procedures (Signed)
[facility]  NCS (NERVE CONDUCTION STUDY) WITH EMG (ELECTROMYOGRAPHY) REPORT   STUDY DATE: 04/06/19 PATIENT NAME: Renee Steele DOB: 1981-03-23 MRN: [id number]  ORDERING CLINICIAN: Dustin Folks, NP  TECHNOLOGIST: Sherre Scarlet ELECTROMYOGRAPHER: Earlean Polka. Penumalli, MD  CLINICAL INFORMATION: 38 year old female with right > left hand numbness x 6 months.  Evaluate for bilateral carpal tunnel syndrome.  FINDINGS: NERVE CONDUCTION STUDY: Bilateral median and left ulnar motor responses are normal.  Bilateral median and bilateral ulnar sensory responses are normal.  Bilateral ulnar F-wave latencies are normal.  Right ulnar motor responses normal distal latency, normal amplitude, slow conduction velocity with stimulation above the elbow (40 m/s) and normal conduction velocity with stimulation below the elbow (57 m/s).   NEEDLE ELECTROMYOGRAPHY:  Needle examination of right upper extremity deltoid, biceps, triceps, flexor carpi radialis, flexor carpi ulnaris, first dorsal interosseous is normal.    IMPRESSION:   This study demonstrates mild slowing of right ulnar motor response with stimulation above the elbow, with otherwise normal electrodiagnostic study results.  Findings could represent mild, early ulnar neuropathy at the elbow, versus technical artifact due to body habitus.  No evidence of bilateral carpal tunnel syndrome.     INTERPRETING PHYSICIAN:  Penni Bombard, MD Certified in Neurology, Neurophysiology and Neuroimaging  [facility] [address], [place], [place] [address] [phone]  [facility]    Nerve / Sites Muscle Latency Ref. Amplitude Ref. Rel Amp Segments Distance Velocity Ref. Area    ms ms mV mV %  cm m/s m/s mVms  R Median - APB     Wrist APB 3.3 ?4.4 8.9 ?4.0 100 Wrist - APB 7   30.5     Upper arm APB 7.0  8.1  91.5 Upper arm - Wrist 23 63 ?49 27.9  L Median - APB     Wrist APB 3.2 ?4.4 10.3 ?4.0 100 Wrist  - APB 7   34.1     Upper arm APB 6.9  8.9  86.7 Upper arm - Wrist 21 58 ?49 29.9  R Ulnar - ADM     Wrist ADM 2.1 ?3.3 8.3 ?6.0 100 Wrist - ADM 7   25.7     B.Elbow ADM 5.5  7.9  95.1 B.Elbow - Wrist 19 57 ?49 24.5     A.Elbow ADM 8.0  7.2  91.2 A.Elbow - B.Elbow 10 40 ?49 23.5         A.Elbow - Wrist      L Ulnar - ADM     Wrist ADM 2.1 ?3.3 9.1 ?6.0 100 Wrist - ADM 7   25.7     B.Elbow ADM 5.4  7.5  81.8 B.Elbow - Wrist 19 58 ?49 21.3     A.Elbow ADM 7.1  7.9  106 A.Elbow - B.Elbow 10 58 ?49 22.8         A.Elbow - Wrist                 SNC    Nerve / Sites Rec. Site Peak Lat Ref.  Amp Ref. Segments Distance    ms ms V V  cm  R Median - Orthodromic (Dig II, Mid palm)     Dig II Wrist 2.6 ?3.4 12 ?10 Dig II - Wrist 13  L Median - Orthodromic (Dig II, Mid palm)     Dig II Wrist 2.5 ?3.4 14 ?10 Dig II - Wrist 13  R Ulnar - Orthodromic, (Dig V, Mid  palm)     Dig V Wrist 2.5 ?3.1 6 ?5 Dig V - Wrist 11  L Ulnar - Orthodromic, (Dig V, Mid palm)     Dig V Wrist 2.4 ?3.1 8 ?5 Dig V - Wrist 33              F  Wave    Nerve F Lat Ref.   ms ms  R Ulnar - ADM 28.5 ?32.0  L Ulnar - ADM 29.1 ?32.0         EMG full       EMG Summary Table    Spontaneous MUAP Recruitment  Muscle IA Fib PSW Fasc Other Amp Dur. Poly Pattern  R. Deltoid Normal None None None _______ Normal Normal Normal Normal  R. Biceps brachii Normal None None None _______ Normal Normal Normal Normal  R. Triceps brachii Normal None None None _______ Normal Normal Normal Normal  R. Flexor carpi radialis Normal None None None _______ Normal Normal Normal Normal  R. Flexor carpi ulnaris Normal None None None _______ Normal Normal Normal Normal  R. First dorsal interosseous Normal None None None _______ Normal Normal Normal Normal

## 2019-04-20 ENCOUNTER — Encounter: Payer: Medicare HMO | Admitting: Diagnostic Neuroimaging

## 2019-07-07 HISTORY — PX: LAPAROSCOPIC GASTRIC SLEEVE RESECTION: SHX5895

## 2019-08-25 ENCOUNTER — Telehealth: Payer: Self-pay

## 2019-08-25 NOTE — Telephone Encounter (Signed)
NOTES ON FILE FROM [facility] [phone], SENT REFERRAL TO SCHEDULING 

## 2019-09-12 ENCOUNTER — Other Ambulatory Visit: Payer: Self-pay

## 2019-09-12 ENCOUNTER — Ambulatory Visit (INDEPENDENT_AMBULATORY_CARE_PROVIDER_SITE_OTHER): Payer: Medicare HMO | Admitting: Interventional Cardiology

## 2019-09-12 ENCOUNTER — Encounter: Payer: Self-pay | Admitting: Interventional Cardiology

## 2019-09-12 VITALS — BP 144/87 | HR 75 | Ht 68.0 in | Wt 398.4 lb

## 2019-09-12 DIAGNOSIS — R0602 Shortness of breath: Secondary | ICD-10-CM | POA: Diagnosis not present

## 2019-09-12 DIAGNOSIS — Z01810 Encounter for preprocedural cardiovascular examination: Secondary | ICD-10-CM

## 2019-09-12 DIAGNOSIS — I10 Essential (primary) hypertension: Secondary | ICD-10-CM | POA: Diagnosis not present

## 2019-09-12 NOTE — Progress Notes (Addendum)
Cardiology Office Note   Date:  09/12/2019   ID:  Renee Steele, DOB 07-Jan-1981, MRN [id number]  PCP:  Sonia Side., FNP    No chief complaint on file.  preop eval  Wt Readings from Last 3 Encounters:  09/12/19 (!) 398 lb 6.4 oz (180.7 kg)  08/06/17 (!) 439 lb 12.8 oz (199.5 kg)  06/06/17 (!) 441 lb (200 kg)       History of Present Illness: Renee Steele is a 39 y.o. female who is being seen today for the evaluation of heart failre at the request of Sonia Side., FNP.  She had an echo in 2018.  Normal LVEF and normal valve function.   She was supposed to have weight loss surgery in Jan 2021 in [place] but she states she: "chickened out."  She was told she had heart failure by her primary care NP.  I do not have all the lab work at this time.  It is somewhat unclear how that diagnosis was made.  She did have diastolic dysfunction noted in 2018.  She reports some dyspnea on exertion.  She has had some aching in her chest at random times.  Is not associated with exertion.  She can notice a tenderness in her chest when she pushes on the left sternal border.    Denies : Exertional chest pain. Dizziness. Leg edema. Nitroglycerin use. Orthopnea. Palpitations. Paroxysmal nocturnal dyspnea. Syncope.   Past Medical History:  Diagnosis Date  . Bacterial vaginosis   . Chlamydia   . Depression   . Diabetes mellitus without complication ([place])   . HPV in female   . Hypertension   . Hypothyroidism   . Morbid obesity ([place])   . OSA on CPAP   . Post traumatic stress disorder (PTSD)   . Urinary tract infection     Past Surgical History:  Procedure Laterality Date  . MOLE REMOVAL    . TONSILLECTOMY    . WISDOM TOOTH EXTRACTION       Current Outpatient Medications  Medication Sig Dispense Refill  . amLODipine (NORVASC) 10 MG tablet Take 1 tablet (10 mg total) by mouth daily. MUST MAKE APPT FOR FURTHER REFILLS 30 tablet 0  . atorvastatin (LIPITOR) 20 MG tablet Take 1  tablet (20 mg total) by mouth daily. 30 tablet 3  . Elastic Bandages & Supports (MEDICAL COMPRESSION STOCKINGS) MISC APPLY ONE STOCKING TO EACH BILATERAL LOWER EXTREMITY FOR SWELLING AND SUPPORT. TO BE FITTED BY MEDICAL SUPPLY. 2 each 0  . hydrochlorothiazide (HYDRODIURIL) 25 MG tablet Take 1 tablet (25 mg total) by mouth daily. MUST MAKE APPT FOR FURTHER REFILLS 30 tablet 0  . hydrOXYzine (ATARAX/VISTARIL) 25 MG tablet Take 1 tablet (25 mg total) by mouth every 6 (six) hours as needed for anxiety. 30 tablet 0  . ibuprofen (ADVIL,MOTRIN) 800 MG tablet Take 1 tablet (800 mg total) by mouth 3 (three) times daily. 21 tablet 0  . levothyroxine (SYNTHROID, LEVOTHROID) 50 MCG tablet Take 1 tablet (50 mcg total) by mouth daily. 30 tablet 5  . losartan (COZAAR) 25 MG tablet     . metFORMIN (GLUCOPHAGE) 500 MG tablet Take 1 tablet (500 mg total) by mouth 2 (two) times daily with a meal. 60 tablet 3  . PAZEO 0.7 % SOLN INT 1 GTT INTO OU ONCE D PRN  5   No current facility-administered medications for this visit.    Allergies:   Lisinopril and Latex  Social History:  The patient  reports that she has never smoked. She has never used smokeless tobacco. She reports current alcohol use. She reports that she does not use drugs.   Family History:  The patient's family history includes Bipolar disorder in her father and mother; Cancer in her mother.    ROS:  Please see the history of present illness.   Otherwise, review of systems are positive for intentional weight loss.   All other systems are reviewed and negative.    PHYSICAL EXAM: VS:  BP (!) 144/87   Pulse 75   Ht 5\' 8"  (1.727 m)   Wt (!) 398 lb 6.4 oz (180.7 kg)   SpO2 98%   BMI 60.58 kg/m  , BMI Body mass index is 60.58 kg/m. GEN: Well nourished, well developed, in no acute distress  HEENT: normal  Neck: no JVD, carotid bruits, or masses Cardiac: RRR; no murmurs, rubs, or gallops,no edema  Respiratory:  clear to auscultation  bilaterally, normal work of breathing GI: soft, nontender, nondistended, + BS, obese MS: no deformity or atrophy  Skin: warm and dry, no rash Neuro:  Strength and sensation are intact Psych: euthymic mood, full affect   EKG:   The ekg ordered today demonstrates normal sinus rhythm, nonspecific ST segment changes, no change from 2018 ECG   Recent Labs: No results found for requested labs within last 8760 hours.   Lipid Panel    Component Value Date/Time   CHOL 139 08/06/2017 1122   TRIG 87 08/06/2017 1122   HDL 41 08/06/2017 1122   CHOLHDL 3.4 08/06/2017 1122   CHOLHDL 5.1 10/12/2016 0609   VLDL 50 (H) 10/12/2016 0609   LDLCALC 81 08/06/2017 1122     Other studies Reviewed: Additional studies/ records that were reviewed today with results demonstrating: 2018 echocardiogram was reviewed.  Normal creatinine in October 2018   ASSESSMENT AND PLAN:  1. Preoperative cardiovascular examination: We will plan for echocardiogram.  I doubt any systolic heart failure.  It is likely that she does have some chronic diastolic heart failure.  She appears euvolemic.  I suspect her shortness of breath is more weight related and related to deconditioning.  She should be treated with blood pressure control.  If her ejection fraction and valvular function are normal on echocardiogram, no further cardiac testing needed before weight loss surgery.  This is to be done in [place]. 2. Hypertension: Continue current medications.  Borderline control today.  Should improve with weight loss. 3. Morbid obesity: She has lost significant weight in the last 12 months.  She thinks this is partly due to to a medication change.  I congratulated her and encouraged her to continue trying to eat healthy and encouraged her of the benefits of weight loss surgery. 4. Await labs from her primary care doctor.  If there are some labs we need, we will get them at the time of her echocardiogram.  Addendum: I received her  labs.  Normal renal function.  No BNP noted.   Current medicines are reviewed at length with the patient today.  The patient concerns regarding her medicines were addressed.  The following changes have been made:  No change  Labs/ tests ordered today include: echo No orders of the defined types were placed in this encounter.   Recommend 150 minutes/week of aerobic exercise Low fat, low carb, high fiber diet recommended  Disposition:   FU based on results   Signed, Larae Grooms, MD  09/12/2019 10:18 AM  [facility] [place], [place], [place]  [address] Phone: [phone]; Fax: [phone]

## 2019-09-12 NOTE — Patient Instructions (Signed)
Medication Instructions:  Your physician recommends that you continue on your current medications as directed. Please refer to the Current Medication list given to you today.  *If you need a refill on your cardiac medications before your next appointment, please call your pharmacy*   Lab Work: None ordered  If you have labs (blood work) drawn today and your tests are completely normal, you will receive your results only by: Marland Kitchen MyChart Message (if you have MyChart) OR . A paper copy in the mail If you have any lab test that is abnormal or we need to change your treatment, we will call you to review the results.   Testing/Procedures: Your physician has requested that you have an echocardiogram. Echocardiography is a painless test that uses sound waves to create images of your heart. It provides your doctor with information about the size and shape of your heart and how well your heart's chambers and valves are working. This procedure takes approximately one hour. There are no restrictions for this procedure.  Follow-Up: Based on test results  Other Instructions

## 2019-09-18 ENCOUNTER — Ambulatory Visit: Payer: Medicare HMO | Admitting: Registered"

## 2019-09-28 ENCOUNTER — Ambulatory Visit (HOSPITAL_COMMUNITY): Payer: Medicare HMO | Attending: Cardiology

## 2019-09-28 ENCOUNTER — Other Ambulatory Visit: Payer: Self-pay

## 2019-09-28 ENCOUNTER — Telehealth: Payer: Self-pay

## 2019-09-28 DIAGNOSIS — R0602 Shortness of breath: Secondary | ICD-10-CM

## 2019-09-28 NOTE — Telephone Encounter (Signed)
-----   Message from Jettie Booze, MD sent at 09/28/2019  4:28 PM EDT ----- Normal LV and valvular function.

## 2019-09-28 NOTE — Telephone Encounter (Signed)
The patient has been notified of the Echo result and verbalized understanding.  All questions (if any) were answered. Frederik Schmidt, RN 09/28/2019 4:37 PM

## 2019-10-13 ENCOUNTER — Telehealth: Payer: Self-pay | Admitting: Interventional Cardiology

## 2019-10-13 NOTE — Telephone Encounter (Signed)
Follow Up:     Pt would like for somebody to go over her Echo results please.

## 2019-10-13 NOTE — Telephone Encounter (Signed)
I spoke with patient and went over echo results with her.  She was told previously she had CHF and she is asking if Dr Irish Lack thinks she has this and if so what is the severity.

## 2019-10-16 NOTE — Telephone Encounter (Signed)
LMTCB

## 2019-10-16 NOTE — Telephone Encounter (Signed)
Pt advised Dr. Hassell Done review of her Echo and she verbalized understanding. Will call back if she develops any further questions.

## 2019-10-16 NOTE — Telephone Encounter (Signed)
No evidence of a weak heart muscle, so no evidence of heart failure.   Heart muscle is thickened, likely from high BP which can lead to heart failure, but this should improve with weight loss.   JV

## 2019-10-17 ENCOUNTER — Encounter: Payer: Self-pay | Admitting: Registered"

## 2019-10-17 ENCOUNTER — Other Ambulatory Visit: Payer: Self-pay

## 2019-10-17 ENCOUNTER — Encounter: Payer: Medicare HMO | Attending: Family | Admitting: Registered"

## 2019-10-17 DIAGNOSIS — Z713 Dietary counseling and surveillance: Secondary | ICD-10-CM

## 2019-10-17 NOTE — Progress Notes (Signed)
  Medical Nutrition Therapy:  Appt start time: 11:03 end time:  12:03.   Assessment:  Primary concerns today: Pt states she has been going to [facility] for about 2 years; sees therapist every few weeks.   Pt expectations: wants to discuss meal ideas   States she is thinking about having bariatric surgery. Pt reports pros and cons of having gastric sleeve surgery: pros - take pressure off heart (sttates she is unsure if she is in heart failure), eat better, eat less, feel better, and have better quality of life; cons - uncertainty of side effects, a lot of loose skin; thinks it will be trump being overweight, and will not be able to eat the way she used to. Pt states she is an emotional eater. Reports sometimes she will eat too much due to depression, not daily. States she will eat at night out of boredom although not hungry.   States she has been depressed lately; hurts when walking. States she went walking with a friend over the weekend. Unable to walk much but glad she went.   Reports trying to drink more water as well. States she is trying gluten-free items for awhile. Reports history of dieting: weight watchers, food addicts anonymous, [employer], and [place] weight loss.     Preferred Learning Style:   No preference indicated   Learning Readiness:   Ready  Change in progress   MEDICATIONS: See list   DIETARY INTAKE:  Usual eating pattern includes 2 meals and 0-3 snacks per day.  Everyday foods include sandwich .  Avoided foods include red meat, pork, bread.    24-hr recall:  B ( AM): typically skips  Snk ( AM):   L ( PM): [place] and cheese sandwich + sour cream and cheddar chips or lasagna Snk ( PM): sometimes nuts or celery + cream cheese or fruit snacks or protein shakes D ( PM): spaghetti (with ground [place]) + gluten-free noodles or lasagna  Snk ( PM):  Beverages: Lipton green tea, water (36 oz)  Usual physical activity: sometimes walking  Estimated  energy needs: 1800 calories 200 g carbohydrates 135 g protein 50 g fat  Progress Towards Goal(s):  In progress.   Nutritional Diagnosis:  NB-1.1 Food and nutrition-related knowledge deficit As related to balanced meals.  As evidenced by dietary recall.    Intervention:  Nutrition education and counseling. Pt was educated and counseled on eating to nourish the body, having balanced meals, components of balanced meals, benefits of increasing fiber intake, ways to increase fiber intake, and benefits of physical activity. Discussed Pt was in agreement with goals listed.  Goals: - Check out [employer] peer support group on the last Tues of each month. Next meeting is 4/27 at 8 pm. [email or web address]  - Check out [email or web address]  on Instgram - Aim to eat 3 meals daily.  - Breakfast can consist of oatmeal + eggs or cereal + milk or toast + sausage.  - Aim to add vegetables to lunch and dinner.  - Add fruit and vegetables to lunch such as lettuce, tomatoes, onions, cucumber to sandwich and fruit as side item.  - Keep sodium intake at 2300 mg or less.   Teaching Method Utilized:  Visual Auditory Hands on  Handouts given during visit include:  none  Barriers to learning/adherence to lifestyle change: contemplative stage of change  Demonstrated degree of understanding via:  Teach Back   Monitoring/Evaluation:  Dietary intake, exercise, and body weight in 1 month(s).

## 2019-10-17 NOTE — Patient Instructions (Addendum)
-   Check out [employer] peer support group on the last Tues of each month. Next meeting is 4/27 at 8 pm. [email or web address]  - Check out [email or web address]  on [employer]  - Aim to eat 3 meals daily.   - Breakfast can consist of oatmeal + eggs or cereal + milk or toast + sausage.    - Aim to add vegetables to lunch and dinner.   - Add fruit and vegetables to lunch such as lettuce, tomatoes, onions, cucumber to sandwich and fruit as side item.   - Keep sodium intake at 2300 mg or less.

## 2019-11-21 ENCOUNTER — Other Ambulatory Visit: Payer: Self-pay

## 2019-11-21 ENCOUNTER — Encounter: Payer: Medicare HMO | Attending: Family | Admitting: Registered"

## 2019-11-21 ENCOUNTER — Encounter: Payer: Self-pay | Admitting: Registered"

## 2019-11-21 DIAGNOSIS — Z713 Dietary counseling and surveillance: Secondary | ICD-10-CM | POA: Diagnosis present

## 2019-11-21 NOTE — Progress Notes (Signed)
  Medical Nutrition Therapy:  Appt start time: 11:38 end time: 12:06.   Assessment:  Primary concerns today: Pt states she has been going to [facility] for about 2 years; sees therapist every few weeks.   Pt expectations: wants to discuss meal ideas   States she wants to be more consistent with eating. States sometimes she doesn't have breakfast, but has started adding to her regimen most days. States she likes roasted brussels sprouts, asparagus, carrots, and kale chips as vegetable options. States she thought last night's dinner was too much because she had 4 different options with her meal. Reports she is on the pathway to have bariatric surgery with [facility].    Preferred Learning Style:   No preference indicated   Learning Readiness:   Ready  Change in progress   MEDICATIONS: See list   DIETARY INTAKE:  Usual eating pattern includes 2-3 meals and 1 snacks per day.  Everyday foods include sandwich.  Avoided foods include red meat, pork, bread.    24-hr recall:  B ( AM): oatmeal or vanilla protein shake (with berries, flaxseed) Snk ( AM):   L ( PM): [place], lettuce, and cheese sandwich + carrots Snk ( PM): ind. bag of chips D ( PM): sauteed curry chicken with olive oil + rice + green beans + carrots  Snk ( PM):  Beverages: Snapple, water (36 oz)  Usual physical activity: sometimes walking  Estimated energy needs: 1800 calories 200 g carbohydrates 135 g protein 50 g fat  Progress Towards Goal(s):  In progress.   Nutritional Diagnosis:  NB-1.1 Food and nutrition-related knowledge deficit As related to balanced meals.  As evidenced by dietary recall.    Intervention:  Nutrition education and counseling. Pt was encouraged with positive changes made related to adding breakfast to daily regimen and increasing vegetable intake. Discussed ways to increase water intake. Pt was in agreement with goals listed.  Goals: - Aim to add protein to  breakfast such as eggs, bacon, sausage, etc.  - Aim to add flavor to water to increase water consumption - flavor packs, lemon juice, lime juice, etc.  - Continue to eat 3 meals a day.   Teaching Method Utilized:  Visual Auditory Hands on  Handouts given during visit include:  none  Barriers to learning/adherence to lifestyle change: contemplative stage of change  Demonstrated degree of understanding via:  Teach Back   Monitoring/Evaluation:  Dietary intake, exercise, and body weight prn.

## 2019-11-21 NOTE — Patient Instructions (Signed)
-   Aim to add protein to breakfast such as eggs, bacon, sausage, etc.   - Aim to add flavor to water to increase water consumption - flavor packs, lemon juice, lime juice, etc.   - Continue to eat 3 meals a day.

## 2020-02-27 DIAGNOSIS — Z9884 Bariatric surgery status: Secondary | ICD-10-CM | POA: Insufficient documentation

## 2020-04-02 ENCOUNTER — Other Ambulatory Visit: Payer: Self-pay | Admitting: Student

## 2020-04-02 DIAGNOSIS — R0600 Dyspnea, unspecified: Secondary | ICD-10-CM

## 2020-04-02 DIAGNOSIS — M79604 Pain in right leg: Secondary | ICD-10-CM

## 2020-04-04 ENCOUNTER — Ambulatory Visit
Admission: RE | Admit: 2020-04-04 | Discharge: 2020-04-04 | Disposition: A | Discharge status: Home / Self Care | Payer: Medicare HMO | Source: Ambulatory Visit | Attending: Student | Admitting: Student

## 2020-04-04 DIAGNOSIS — M79604 Pain in right leg: Secondary | ICD-10-CM

## 2020-04-04 DIAGNOSIS — R0600 Dyspnea, unspecified: Secondary | ICD-10-CM

## 2020-04-05 ENCOUNTER — Ambulatory Visit (HOSPITAL_COMMUNITY)
Admission: EM | Admit: 2020-04-05 | Discharge: 2020-04-05 | Disposition: A | Discharge status: Home / Self Care | Payer: Medicare HMO | Attending: Psychiatry | Admitting: Psychiatry

## 2020-04-05 ENCOUNTER — Other Ambulatory Visit: Payer: Self-pay

## 2020-04-05 ENCOUNTER — Ambulatory Visit: Payer: Medicare HMO | Admitting: Podiatry

## 2020-04-05 DIAGNOSIS — F3160 Bipolar disorder, current episode mixed, unspecified: Secondary | ICD-10-CM | POA: Insufficient documentation

## 2020-04-05 NOTE — BH Assessment (Signed)
Comprehensive Clinical Assessment (CCA) Screening, Triage and Referral Note  04/05/2020 Renee Steele [id number] Patient presents this date requesting resources for ongoing gambling issues. Patient is denying any S/I, H/I or AVH. Patient is currently receiving OP services for depression from [facility] who assists with medication management. Patient reports medication compliance and denies any current symptoms. Patient is requesting information in reference to any assistance for her gambling that has resulted in patient not being able to pay her rent. Patient denies any prior attempts or gestures at self harm. Patient states she has recently started counseling at [facility] also although has only met with that provider once. Patient states she will continue seeing that counselor although is interested this date in obtaining information for group counseling and financial planning. Hall Busing NP writes:     Patient presents voluntarily to [facility] behavioral health center for walk-in assessment.  Patient reports she primarily came in to seek financial assistance as she has spent her entire monthly income on gambling.  Patient specifically seeking resources for rent payment assistance.  Patient assessed by nurse practitioner.  Patient alert and oriented, answers appropriately.  Patient pleasant cooperative with assessment.  Patient insightful at times suggesting that she was aware she should not gamble with her money and will not participate in gambling again.  Patient reports she is diagnosed with bipolar disorder and PTSD.  Patient denies any difficulty sleeping at this time.  Patient conversation is appropriate and goal-directed.  Patient does not exhibit symptoms of mania or hypomania during this assessment.  Patient is seen by outpatient psychiatrist at [facility].  Patient reports she has been compliant with medications including Abilify daily and Vistaril as needed.   Patient reports she is seen by outpatient counselor at [facility] every other week.  Patient denies suicidal ideations.  Patient denies any history of self-harm behaviors or suicide attempts.  Patient denies homicidal ideations.  Patient denies both auditory and visual hallucinations.  There is no evidence of delusional thought content and patient does not appear to be responding to internal stimuli.  Patient resides in [place].  Patient denies access to weapons.  Patient reports she receives Social Security related to her mental health diagnoses.  Patient denies alcohol and substance use.  Patient offered support and encouragement.  Patient is interested in following up with group therapy to address gambling behaviors. Patient gives verbal consent for this writer to speak with her friend, [name] phone number [phone]. Attempted to telephone [name]. HIPAA compliant voicemail left. Hall Busing NP recommended patient be provided with OP resources and discharged this date.  Visit Diagnosis: Mixed bipolar I disorder (per notes)     ICD-10-CM   1. Mixed bipolar I disorder ([place])  F31.60     Patient Reported Information How did you hear about [place]? Self   Referral name: No data recorded  Referral phone number: No data recorded Whom do you see for routine medical problems? I don't have a doctor   Practice/Facility Name: No data recorded  Practice/Facility Phone Number: No data recorded  Name of Contact: No data recorded  Contact Number: No data recorded  Contact Fax Number: No data recorded  Prescriber Name: No data recorded  Prescriber Address (if known): No data recorded What Is the Reason for Your Visit/Call Today? Pt is requesting gambling resources  How Long Has This Been Causing You Problems? 1-6 months  Have You Recently Been in Any Inpatient Treatment (Hospital/Detox/Crisis Center/28-Day Program)? No   Name/Location of Program/Hospital:No  data recorded  How Long Were You  There? No data recorded  When Were You Discharged? No data recorded Have You Ever Received Services From [facility] Before? No   Who Do You See at [facility]? No data recorded Have You Recently Had Any Thoughts About Hurting Yourself? No   Are You Planning to Commit Suicide/Harm Yourself At This time?  No  Have you Recently Had Thoughts About [place]? No   Explanation: No data recorded Have You Used Any Alcohol or Drugs in the Past 24 Hours? No   How Long Ago Did You Use Drugs or Alcohol?  No data recorded  What Did You Use and How Much? No data recorded What Do You Feel Would Help You the Most Today? Therapy  Do You Currently Have a Therapist/Psychiatrist? No   Name of Therapist/Psychiatrist: No data recorded  Have You Been Recently Discharged From Any Office Practice or Programs? No   Explanation of Discharge From Practice/Program:  No data recorded    CCA Screening Triage Referral Assessment Type of Contact: Face-to-Face   Is this Initial or Reassessment? No data recorded  Date Telepsych consult ordered in [facility]:  No data recorded  Time Telepsych consult ordered in [facility]:  No data recorded Patient Reported Information Reviewed? Yes   Patient Left Without Being Seen? No data recorded  Reason for Not Completing Assessment: No data recorded Collateral Involvement: No data recorded Does Patient Have a [place]? No data recorded  Name and Contact of Legal Guardian:  No data recorded If Minor and Not Living with Parent(s), Who has Custody? No data recorded Is CPS involved or ever been involved? Never  Is APS involved or ever been involved? Never  Patient Determined To Be At Risk for Harm To Self or Others Based on Review of Patient Reported Information or Presenting Complaint? No   Method: No data recorded  Availability of Means: No data recorded  Intent: No data recorded  Notification Required: No data recorded  Additional Information  for Danger to Others Potential:  No data recorded  Additional Comments for Danger to Others Potential:  No data recorded  Are There Guns or Other Weapons in Your Home?  No data recorded   Types of Guns/Weapons: No data recorded   Are These Weapons Safely Secured?                              No data recorded   Who Could Verify You Are Able To Have These Secured:    No data recorded Do You Have any Outstanding Charges, Pending Court Dates, Parole/Probation? No data recorded Contacted To Inform of Risk of Harm To Self or Others: Other: Comment (NA)  Location of Assessment: [facility] Assessment Services  Does Patient Present under Involuntary Commitment? No   IVC Papers Initial File Date: No data recorded  [place] of Residence: [place]  Patient Currently Receiving the Following Services: Medication Management   Determination of Need: No data recorded  Options For Referral: Outpatient Therapy   Mamie Nick, LCAS

## 2020-04-05 NOTE — Discharge Instructions (Addendum)

## 2020-04-05 NOTE — ED Notes (Signed)
Tech providing pt with snack and water

## 2020-04-05 NOTE — ED Notes (Signed)
Locker #[phone]

## 2020-04-05 NOTE — ED Provider Notes (Addendum)
Behavioral Health Urgent Care Medical Screening Exam  Patient Name: Renee Steele MRN: [id number] Date of Evaluation: 04/05/20 Chief Complaint:   Diagnosis:  Final diagnoses:  Mixed bipolar I disorder ([place])    History of Present illness: Renee Steele is a 39 y.o. female.  Patient presents voluntarily to [facility] behavioral health center for walk-in assessment.  Patient reports she primarily came in to seek financial assistance as she has spent her entire monthly income on gambling.  Patient specifically seeking resources for rent payment assistance.  Patient assessed by nurse practitioner.  Patient alert and oriented, answers appropriately.  Patient pleasant cooperative with assessment.  Patient insightful at times suggesting that she was aware she should not gamble with her money and will not participate in gambling again.  Patient reports she is diagnosed with bipolar disorder and PTSD.  Patient denies any difficulty sleeping at this time.  Patient conversation is appropriate and goal-directed.  Patient does not exhibit symptoms of mania or hypomania during this assessment.  Patient is seen by outpatient psychiatrist at [facility].  Patient reports she has been compliant with medications including Abilify daily and Vistaril as needed.  Patient reports she is seen by outpatient counselor at [facility] every other week.  Patient denies suicidal ideations.  Patient denies any history of self-harm behaviors or suicide attempts.  Patient denies homicidal ideations.  Patient denies both auditory and visual hallucinations.  There is no evidence of delusional thought content and patient does not appear to be responding to internal stimuli.  Patient resides in [place].  Patient denies access to weapons.  Patient reports she receives Social Security related to her mental health diagnoses.  Patient denies alcohol and substance use.  Patient offered support and  encouragement.  Patient is interested in following up with group therapy to address gambling behaviors.   Patient gives verbal consent for this writer to speak with her friend, [name] phone number [phone].  Attempted to telephone [name].  HIPAA compliant voicemail left.    Psychiatric Specialty Exam  Presentation  General Appearance:Appropriate for Environment;Casual  Eye Contact:Good  Speech:Clear and Coherent;Normal Rate  Speech Volume:Normal  Handedness:Right   Mood and Affect  Mood:Euthymic  Affect:Congruent   Thought Process  Thought Processes:Coherent;Goal Directed  Descriptions of Associations:Intact  Orientation:Full (Time, Place and Person)  Thought Content:Logical;WDL  Hallucinations:None  Ideas of Reference:None  Suicidal Thoughts:No  Homicidal Thoughts:No   Sensorium  Memory:Immediate Fair;Recent Fair;Remote Fair  Judgment:Fair  Insight:Good   Executive Functions  Concentration:Good  Attention Span:Good  Recall:Good  Fund of Knowledge:Good  Language:Good   Psychomotor Activity  Psychomotor Activity:Normal   Assets  Assets:Communication Skills;Desire for Improvement;Financial Resources/Insurance;Housing;Physical Health;Resilience;Social Support   Sleep  Sleep:Fair  Number of hours: No data recorded  Physical Exam: Physical Exam Vitals and nursing note reviewed.  Constitutional:      Appearance: She is well-developed.  HENT:     Head: Normocephalic.  Cardiovascular:     Rate and Rhythm: Normal rate.  Pulmonary:     Effort: Pulmonary effort is normal.  Neurological:     Mental Status: She is alert and oriented to person, place, and time.  Psychiatric:        Attention and Perception: Attention and perception normal.        Mood and Affect: Mood and affect normal.        Speech: Speech normal.        Behavior: Behavior normal. Behavior is cooperative.  Thought Content: Thought content normal.         Cognition and Memory: Cognition and memory normal.        Judgment: Judgment normal.    Review of Systems  Constitutional: Negative.   HENT: Negative.   Eyes: Negative.   Respiratory: Negative.   Cardiovascular: Negative.   Gastrointestinal: Negative.   Genitourinary: Negative.   Musculoskeletal: Negative.   Skin: Negative.   Neurological: Negative.   Endo/Heme/Allergies: Negative.   Psychiatric/Behavioral: Negative.    Blood pressure 129/81, pulse 62, temperature (!) 97.5 F (36.4 C), temperature source Temporal, resp. rate 16, height 5\' 8"  (1.727 m), weight (!) 376 lb (170.6 kg), SpO2 100 %. Body mass index is 57.17 kg/m.  Musculoskeletal: Strength & Muscle Tone: within normal limits Gait & Station: normal Patient leans: N/A   [place] MSE Discharge Disposition for Follow up and Recommendations: Based on my evaluation the patient does not appear to have an emergency medical condition and can be discharged with resources and follow up care in outpatient services for Medication Management and Individual Therapy   Patient verbalizes understanding of plan to follow-up with established outpatient psychiatrist and counselor.  Patient reviewed with Dr. Hampton Abbot.   Emmaline Kluver, FNP 04/05/2020, 5:41 PM

## 2020-04-25 ENCOUNTER — Other Ambulatory Visit: Payer: Self-pay

## 2020-04-25 ENCOUNTER — Encounter: Payer: Self-pay | Admitting: Interventional Cardiology

## 2020-04-25 ENCOUNTER — Ambulatory Visit (INDEPENDENT_AMBULATORY_CARE_PROVIDER_SITE_OTHER): Payer: Medicare HMO | Admitting: Interventional Cardiology

## 2020-04-25 VITALS — BP 124/72 | HR 100 | Ht 68.0 in | Wt 370.0 lb

## 2020-04-25 DIAGNOSIS — I10 Essential (primary) hypertension: Secondary | ICD-10-CM | POA: Diagnosis not present

## 2020-04-25 DIAGNOSIS — I517 Cardiomegaly: Secondary | ICD-10-CM | POA: Diagnosis not present

## 2020-04-25 NOTE — Patient Instructions (Signed)

## 2020-04-25 NOTE — Progress Notes (Signed)
Cardiology Office Note   Date:  04/25/2020   ID:  Quantia, Grullon [birth year], MRN [id number]  PCP:  Sonia Side., FNP    No chief complaint on file.  LVH  Wt Readings from Last 3 Encounters:  04/25/20 (!) 370 lb (167.8 kg)  09/12/19 (!) 398 lb 6.4 oz (180.7 kg)  08/06/17 (!) 439 lb 12.8 oz (199.5 kg)       History of Present Illness: Renee Steele is a 39 y.o. female  had an echo in 2018.  Normal LVEF and normal valve function.   She was supposed to have weight loss surgery in Jan 2021 in [place] but she states she: "chickened out."  She was told she had heart failure by her primary care NP.  I do not have all the lab work at this time.  It is somewhat unclear how that diagnosis was made.  She did have diastolic dysfunction noted in 2018.  She reports some dyspnea on exertion.  She has had some aching in her chest at random times.  Is not associated with exertion.  She can notice a tenderness in her chest when she pushes on the left sternal border.    Since the last visit, she had weight loss surgery.  She lost 35 lbs in the past 2 months.  She has had some depression.  Still concerned about her echo result and heart failure diagnosis.   Denies : Chest pain. Dizziness. Leg edema. Nitroglycerin use. Orthopnea. Palpitations. Paroxysmal nocturnal dyspnea. Shortness of breath. Syncope.    Past Medical History:  Diagnosis Date  . Bacterial vaginosis   . Chlamydia   . Depression   . Diabetes mellitus without complication ([place])   . HPV in female   . Hypertension   . Hypothyroidism   . Morbid obesity ([place])   . OSA on CPAP   . Post traumatic stress disorder (PTSD)   . Urinary tract infection     Past Surgical History:  Procedure Laterality Date  . MOLE REMOVAL    . TONSILLECTOMY    . WISDOM TOOTH EXTRACTION       Current Outpatient Medications  Medication Sig Dispense Refill  . amLODipine (NORVASC) 10 MG tablet Take 1 tablet (10 mg total) by mouth  daily. MUST MAKE APPT FOR FURTHER REFILLS 30 tablet 0  . ARIPiprazole (ABILIFY) 10 MG tablet Take 10 mg by mouth daily.    Marland Kitchen atorvastatin (LIPITOR) 20 MG tablet Take 1 tablet (20 mg total) by mouth daily. 30 tablet 3  . cetirizine (ZYRTEC) 10 MG tablet Take 10 mg by mouth daily.    Water engineer Bandages & Supports (MEDICAL COMPRESSION STOCKINGS) MISC APPLY ONE STOCKING TO EACH BILATERAL LOWER EXTREMITY FOR SWELLING AND SUPPORT. TO BE FITTED BY MEDICAL SUPPLY. 2 each 0  . hydrOXYzine (ATARAX/VISTARIL) 25 MG tablet Take 1 tablet (25 mg total) by mouth every 6 (six) hours as needed for anxiety. 30 tablet 0  . ibuprofen (ADVIL,MOTRIN) 800 MG tablet Take 1 tablet (800 mg total) by mouth 3 (three) times daily. 21 tablet 0  . levothyroxine (SYNTHROID, LEVOTHROID) 50 MCG tablet Take 1 tablet (50 mcg total) by mouth daily. 30 tablet 5  . losartan (COZAAR) 25 MG tablet     . omeprazole (PRILOSEC) 10 MG capsule Take 20 mg by mouth daily.     Marland Kitchen PAZEO 0.7 % SOLN INT 1 GTT INTO OU ONCE D PRN  5  . ursodiol (ACTIGALL) 250 MG  tablet Take 250 mg by mouth 3 (three) times daily.     No current facility-administered medications for this visit.    Allergies:   Lisinopril and Latex    Social History:  The patient  reports that she has never smoked. She has never used smokeless tobacco. She reports current alcohol use. She reports that she does not use drugs.   Family History:  The patient's family history includes Bipolar disorder in her father and mother; Cancer in her mother.    ROS:  Please see the history of present illness.   Otherwise, review of systems are positive for depression.   All other systems are reviewed and negative.    PHYSICAL EXAM: VS:  BP 124/72   Pulse 100   Ht 5\' 8"  (1.727 m)   Wt (!) 370 lb (167.8 kg)   SpO2 96%   BMI 56.26 kg/m  , BMI Body mass index is 56.26 kg/m. GEN: Well nourished, well developed, in no acute distress  HEENT: normal  Neck: no JVD, carotid bruits, or  masses Cardiac: RRR; no murmurs, rubs, or gallops,no edema  Respiratory:  clear to auscultation bilaterally, normal work of breathing GI: soft, nontender, nondistended, + BS, obesity MS: no deformity or atrophy  Skin: warm and dry, no rash Neuro:  Strength and sensation are intact Psych: euthymic mood, full affect    Recent Labs: No results found for requested labs within last 8760 hours.   Lipid Panel    Component Value Date/Time   CHOL 139 08/06/2017 1122   TRIG 87 08/06/2017 1122   HDL 41 08/06/2017 1122   CHOLHDL 3.4 08/06/2017 1122   CHOLHDL 5.1 10/12/2016 0609   VLDL 50 (H) 10/12/2016 0609   LDLCALC 81 08/06/2017 1122     Other studies Reviewed: Additional studies/ records that were reviewed today with results demonstrating: echo results and lab results reviewed.  LDL 81 in 2019   ASSESSMENT AND PLAN:  1. Hypertension: Blood pressure well controlled.  HCTZ was stopped after her recent weight loss, post weight loss surgery.  I encouraged her to continue sticking to the diet and trying to lose weight.  She may be able to stop her other medications. 2. Morbid obesity: Improving after surgery. 3. LVH: Noted on echocardiogram.  I think this has a chance of resolving as her blood pressure improvements.  She is very concerned about a diagnosis of "heart failure".  She really has no volume overload that can be seen on exam.  I would not consider her in heart failure. 4. Hyperlipidemia: Continue atorvastatin.  She will need more recent lipids.  They may have been checked elsewhere. 5. Overall doing very well after weight loss surgery.  I congratulated her. I did ask her to check with her surgeons if eating a more plant-based diet would be allowable given her surgery.  This would be better for her heart.  We talked about high-fiber foods like vegetables, beans, oatmeal.  She thinks that they are recommended but she will double check.   Current medicines are reviewed at length with  the patient today.  The patient concerns regarding her medicines were addressed.  The following changes have been made:  No change  Labs/ tests ordered today include:  No orders of the defined types were placed in this encounter.   Recommend 150 minutes/week of aerobic exercise Low fat, low carb, high fiber diet recommended  Disposition:   FU in 1 year   Signed, Larae Grooms, MD  04/25/2020 4:49 PM    [facility] [address], [place], [place]  [address] Phone: [phone]; Fax: [phone]

## 2020-07-19 ENCOUNTER — Other Ambulatory Visit: Payer: Self-pay | Admitting: Student

## 2020-07-19 ENCOUNTER — Ambulatory Visit
Admission: RE | Admit: 2020-07-19 | Discharge: 2020-07-19 | Disposition: A | Discharge status: Home / Self Care | Payer: Medicare HMO | Source: Ambulatory Visit | Attending: Student | Admitting: Student

## 2020-07-19 DIAGNOSIS — M25511 Pain in right shoulder: Secondary | ICD-10-CM

## 2020-07-19 DIAGNOSIS — M2560 Stiffness of unspecified joint, not elsewhere classified: Secondary | ICD-10-CM

## 2021-01-12 ENCOUNTER — Emergency Department (HOSPITAL_COMMUNITY)
Admission: AD | Admit: 2021-01-12 | Discharge: 2021-01-13 | Disposition: A | Discharge status: Home / Self Care | Payer: Medicare HMO | Attending: Family Medicine | Admitting: Family Medicine

## 2021-01-12 ENCOUNTER — Other Ambulatory Visit: Payer: Self-pay

## 2021-01-12 ENCOUNTER — Encounter (HOSPITAL_COMMUNITY): Payer: Self-pay | Admitting: Family Medicine

## 2021-01-12 DIAGNOSIS — R1030 Lower abdominal pain, unspecified: Secondary | ICD-10-CM | POA: Insufficient documentation

## 2021-01-12 DIAGNOSIS — Z5321 Procedure and treatment not carried out due to patient leaving prior to being seen by health care provider: Secondary | ICD-10-CM | POA: Diagnosis not present

## 2021-01-12 DIAGNOSIS — N939 Abnormal uterine and vaginal bleeding, unspecified: Secondary | ICD-10-CM | POA: Diagnosis present

## 2021-01-12 NOTE — ED Triage Notes (Signed)
Pt sent from MAU.  States she had not had menstrual cycle in over 1 year and Dr gave her progesterone to start period.  States she has had heavy vaginal bleeding since Wednesday with lower abd pain.

## 2021-01-12 NOTE — MAU Provider Note (Signed)
Event Date/Time   First Provider Initiated Contact with Patient 01/12/21 1710    S Renee Steele is a 40 y.o. No obstetric history on file. patient who presents to MAU today with complaint of vaginal bleeding. She reports she has not a period in a year and her GYN prescribed her medication to induce a period. She reports she started bleeding on Wednesday and is concerned it is too much bleeding.  O BP (!) 152/81 (BP Location: Right Arm)   Pulse 95   Temp 98.5 F (36.9 C) (Oral)   Resp 16   LMP  (LMP Unknown)   SpO2 99%  Physical Exam Vitals and nursing note reviewed.  Constitutional:      General: She is not in acute distress.    Appearance: She is well-developed.  HENT:     Head: Normocephalic.  Eyes:     Pupils: Pupils are equal, round, and reactive to light.  Cardiovascular:     Rate and Rhythm: Normal rate.  Pulmonary:     Effort: Pulmonary effort is normal. No respiratory distress.  Abdominal:     General: There is no distension.     Palpations: Abdomen is soft.     Tenderness: There is no abdominal tenderness.  Skin:    General: Skin is warm and dry.  Neurological:     Mental Status: She is alert and oriented to person, place, and time.  Psychiatric:        Mood and Affect: Mood normal.        Behavior: Behavior normal.        Thought Content: Thought content normal.        Judgment: Judgment normal.    A Medical screening exam complete 1. Abnormal uterine bleeding (AUB)    Discussed with patient normalcy of heavy bleeding after no period for an extended amount of time and known thickened endometrium. Encouraged patient to follow up with GYN tomorrow but patient desires to be evaluated tonight.   Report called to [place] [facility] in ED who accepts transfer.   P Discharge from MAU in stable condition Patient given the option of transfer to [facility] for further evaluation or seek care in outpatient facility of choice  List of options for follow-up given  Warning  signs for worsening condition that would warrant emergency follow-up discussed Patient may return to MAU as needed   Wende Mott, [place] 01/12/2021 5:10 PM

## 2021-01-12 NOTE — MAU Note (Signed)
Pt reports to mau with c/o heavy vag bleeding since wed after being started on a medication by her GYN.  Pt denies pregnancy, reports she wants the bleeding checked out.  Reports some lower abd cramping.

## 2021-01-13 NOTE — ED Notes (Signed)
Pt left AMA °

## 2021-02-27 ENCOUNTER — Ambulatory Visit: Payer: Medicare HMO | Admitting: Podiatry

## 2021-03-05 ENCOUNTER — Other Ambulatory Visit: Payer: Self-pay

## 2021-03-05 ENCOUNTER — Ambulatory Visit (INDEPENDENT_AMBULATORY_CARE_PROVIDER_SITE_OTHER): Payer: Medicare HMO | Admitting: Podiatry

## 2021-03-05 DIAGNOSIS — L989 Disorder of the skin and subcutaneous tissue, unspecified: Secondary | ICD-10-CM | POA: Diagnosis not present

## 2021-03-06 ENCOUNTER — Encounter: Payer: Self-pay | Admitting: Podiatry

## 2021-03-06 NOTE — Progress Notes (Signed)
Subjective:  Patient ID: Renee Steele, female    DOB: [birth year],  MRN: [id number]  Chief Complaint  Patient presents with   Foot Pain    Right foot  PT is concerned about the discolored spots on the bottom of her foot     40 y.o. female presents with the above complaint.  Patient presents with complaint of multiple lesions to the bottom of the foot.  Patient states that they have been present for quite some time and does not seem to have gotten bigger but she has been becoming more noticeable.  She wanted to get evaluated make sure that there is nothing malignant going on.  She does not have any history of cancer or family history of cancer.He is a  Diabetic with last A1c of 5.1.  She denies seeing anyone else prior to seeing me.  She denies getting a biopsy done of the lesions.  She denies any other acute complaints   Review of Systems: Negative except as noted in the HPI. Denies N/V/F/Ch.  Past Medical History:  Diagnosis Date   Bacterial vaginosis    Chlamydia    Depression    Diabetes mellitus without complication ([place])    HPV in female    Hypertension    Hypothyroidism    Morbid obesity ([place])    OSA on CPAP    Post traumatic stress disorder (PTSD)    Urinary tract infection     Current Outpatient Medications:    amLODipine (NORVASC) 10 MG tablet, Take 1 tablet (10 mg total) by mouth daily. MUST MAKE APPT FOR FURTHER REFILLS, Disp: 30 tablet, Rfl: 0   ARIPiprazole (ABILIFY) 10 MG tablet, Take 10 mg by mouth daily., Disp: , Rfl:    atorvastatin (LIPITOR) 20 MG tablet, Take 1 tablet (20 mg total) by mouth daily., Disp: 30 tablet, Rfl: 3   cetirizine (ZYRTEC) 10 MG tablet, Take 10 mg by mouth daily., Disp: , Rfl:    Elastic Bandages & Supports ([place]) MISC, APPLY ONE STOCKING TO EACH BILATERAL LOWER EXTREMITY FOR SWELLING AND SUPPORT. TO BE FITTED BY MEDICAL SUPPLY., Disp: 2 each, Rfl: 0   hydrOXYzine (ATARAX/VISTARIL) 25 MG tablet, Take 1 tablet (25  mg total) by mouth every 6 (six) hours as needed for anxiety., Disp: 30 tablet, Rfl: 0   ibuprofen (ADVIL,MOTRIN) 800 MG tablet, Take 1 tablet (800 mg total) by mouth 3 (three) times daily., Disp: 21 tablet, Rfl: 0   levothyroxine (SYNTHROID, LEVOTHROID) 50 MCG tablet, Take 1 tablet (50 mcg total) by mouth daily., Disp: 30 tablet, Rfl: 5   losartan (COZAAR) 25 MG tablet, , Disp: , Rfl:    omeprazole (PRILOSEC) 10 MG capsule, Take 20 mg by mouth daily. , Disp: , Rfl:    PAZEO 0.7 % SOLN, INT 1 GTT INTO OU ONCE D PRN, Disp: , Rfl: 5   ursodiol (ACTIGALL) 250 MG tablet, Take 250 mg by mouth 3 (three) times daily., Disp: , Rfl:   Social History   Tobacco Use  Smoking Status Never  Smokeless Tobacco Never    Allergies  Allergen Reactions   Lisinopril Shortness Of Breath and Cough   Latex    Objective:  There were no vitals filed for this visit. There is no height or weight on file to calculate BMI. Constitutional Well developed. Well nourished.  Vascular Dorsalis pedis pulses palpable bilaterally. Posterior tibial pulses palpable bilaterally. Capillary refill normal to all digits.  No cyanosis or clubbing noted. Pedal hair growth  normal.  Neurologic Normal speech. Oriented to person, place, and time. Epicritic sensation to light touch grossly present bilaterally.  Dermatologic Multiple benign skin lesions noted on the plantar aspect of right foot.  Most notably submetatarsal 5.  The lesions appear to be in the dermal layer.  No asymmetry noted.  Borders circular.  Uniform color noted.  Orthopedic: Normal joint ROM without pain or crepitus bilaterally. No visible deformities. No bony tenderness.   Radiographs: None Assessment:   1. Benign skin lesion    Plan:  Patient was evaluated and treated and all questions answered.  Right submetatarsal 5 benign skin lesion -I explained to the patient the etiology of skin lesion versus treatment options were discussed.  Given that this  is more of a newer onset that may have had then recently.  Given the growth of it and the nature of it I believe she would benefit from a biopsy of the lesion.  I discussed with her she will benefit from a punch biopsy.  Procedure Punch biopsy -Skin was prepped with Betadine gauze.  The skin and the lesion around the skin was infiltrated with 3 cc of 1% lidocaine plain in V-block manner to provide anesthesia.  A 3 mm punch biopsy was taken in standard technique.  The soft tissue biopsy was sent to pathology in a standard technique and sterile technique.  The foot was wrapped with 4 x 4 gauze Kerlix Ace bandage.  I encouraged her to use a Band-Aid and triple antibiotic for the next few weeks.  She states understanding  No follow-ups on file.

## 2021-03-13 ENCOUNTER — Telehealth: Payer: Self-pay | Admitting: *Deleted

## 2021-03-13 NOTE — Telephone Encounter (Signed)
Renee Steele w/ [facility] is requesting skin sample information that was sent in.The requisition list the dorsal foot at base of 5th toe, also mid distal plantar , right foot,which is correct? How big or large is the lesion? Is the lesion changing or growing? Was a photo taken and any other description. Please call: [phone].

## 2021-03-18 NOTE — Telephone Encounter (Signed)
Patient is calling for the lab results from her visit on 03/05/21. They are in Mychart but unable to interpret. Please advise.

## 2021-03-19 ENCOUNTER — Other Ambulatory Visit: Payer: Self-pay | Admitting: Podiatry

## 2021-03-19 DIAGNOSIS — D229 Melanocytic nevi, unspecified: Secondary | ICD-10-CM

## 2021-03-19 NOTE — Progress Notes (Signed)
I spoke with Ms. Renee Steele at length about her findings for the punch biopsy.  I discussed with her that she would benefit from a second opinion.  I referred her to dermatology.  I discussed with her that this is very benign in nature however there is some form that could be turn into a malignant form.  Patient agrees and would like to follow-up with dermatology for second opinion.

## 2021-03-24 ENCOUNTER — Other Ambulatory Visit: Payer: Self-pay | Admitting: *Deleted

## 2021-03-25 NOTE — Telephone Encounter (Signed)
Faxed a referral to [facility], Attn: Brayton Layman . They have scheduled patient for 03/26/21 @ 12:20. The patient has been notified and given their number if she needs to reschedule the appointment.

## 2021-04-16 ENCOUNTER — Ambulatory Visit: Payer: Medicare HMO | Admitting: Podiatry

## 2021-05-13 ENCOUNTER — Ambulatory Visit (INDEPENDENT_AMBULATORY_CARE_PROVIDER_SITE_OTHER): Payer: Medicare HMO | Admitting: Interventional Cardiology

## 2021-05-13 ENCOUNTER — Other Ambulatory Visit: Payer: Self-pay

## 2021-05-13 ENCOUNTER — Encounter: Payer: Self-pay | Admitting: Interventional Cardiology

## 2021-05-13 VITALS — BP 138/88 | HR 80 | Ht 68.0 in | Wt 353.2 lb

## 2021-05-13 DIAGNOSIS — I10 Essential (primary) hypertension: Secondary | ICD-10-CM

## 2021-05-13 DIAGNOSIS — I517 Cardiomegaly: Secondary | ICD-10-CM

## 2021-05-13 DIAGNOSIS — E782 Mixed hyperlipidemia: Secondary | ICD-10-CM | POA: Diagnosis not present

## 2021-05-13 DIAGNOSIS — R002 Palpitations: Secondary | ICD-10-CM

## 2021-05-13 NOTE — Progress Notes (Signed)
Cardiology Office Note   Date:  05/13/2021   ID:  Renee Steele, DOB 06-10-1981, MRN [id number]  PCP:  Cipriano Mile, NP    No chief complaint on file.  LVH  Wt Readings from Last 3 Encounters:  05/13/21 (!) 353 lb 3.2 oz (160.2 kg)  04/25/20 (!) 370 lb (167.8 kg)  09/12/19 (!) 398 lb 6.4 oz (180.7 kg)       History of Present Illness: Renee Steele is a 40 y.o. female  who had an echo in 2018.  Normal LVEF and normal valve function.    She was supposed to have weight loss surgery in Jan 2021 in [place] but she states she: "chickened out."   She was told she had heart failure by her primary care NP.  I do not have all the lab work at this time.  It is somewhat unclear how that diagnosis was made.  She did have diastolic dysfunction noted in 2018.   In the past, it was noted: "She reports some dyspnea on exertion.  She has had some aching in her chest at random times.  Is not associated with exertion.  She can notice a tenderness in her chest when she pushes on the left sternal border."   In 2021, she had weight loss surgery.  She lost 35 lbs in the past 2 months.  She had some depression and was concerned about her echo result and heart failure diagnosis.   She walks regularly, 30 minutes/day.  Eating more plant based diet but She does eat a lot of lamb, chicken, fish, salmon, shrimp, beef.  Denies : Chest pain. Dizziness. Leg edema. Nitroglycerin use. Orthopnea. Paroxysmal nocturnal dyspnea.  Syncope.    Rare palpitations.  Last just seconds.   Past Medical History:  Diagnosis Date   Bacterial vaginosis    Chlamydia    Depression    Diabetes mellitus without complication ([place])    HPV in female    Hypertension    Hypothyroidism    Morbid obesity ([place])    OSA on CPAP    Post traumatic stress disorder (PTSD)    Urinary tract infection     Past Surgical History:  Procedure Laterality Date   MOLE REMOVAL     TONSILLECTOMY     WISDOM TOOTH EXTRACTION        Current Outpatient Medications  Medication Sig Dispense Refill   amLODipine (NORVASC) 10 MG tablet Take 1 tablet (10 mg total) by mouth daily. MUST MAKE APPT FOR FURTHER REFILLS 30 tablet 0   ARIPiprazole (ABILIFY) 10 MG tablet Take 10 mg by mouth daily.     atorvastatin (LIPITOR) 20 MG tablet Take 1 tablet (20 mg total) by mouth daily. 30 tablet 3   cetirizine (ZYRTEC) 10 MG tablet Take 10 mg by mouth daily.     Elastic Bandages & Supports (MEDICAL COMPRESSION STOCKINGS) MISC APPLY ONE STOCKING TO EACH BILATERAL LOWER EXTREMITY FOR SWELLING AND SUPPORT. TO BE FITTED BY MEDICAL SUPPLY. 2 each 0   hydrOXYzine (ATARAX/VISTARIL) 25 MG tablet Take 1 tablet (25 mg total) by mouth every 6 (six) hours as needed for anxiety. 30 tablet 0   ibuprofen (ADVIL,MOTRIN) 800 MG tablet Take 1 tablet (800 mg total) by mouth 3 (three) times daily. 21 tablet 0   levothyroxine (SYNTHROID, LEVOTHROID) 50 MCG tablet Take 1 tablet (50 mcg total) by mouth daily. 30 tablet 5   losartan (COZAAR) 25 MG tablet      omeprazole (PRILOSEC)  10 MG capsule Take 20 mg by mouth daily.      PAZEO 0.7 % SOLN INT 1 GTT INTO OU ONCE D PRN  5   ursodiol (ACTIGALL) 250 MG tablet Take 250 mg by mouth 3 (three) times daily.     No current facility-administered medications for this visit.    Allergies:   Lisinopril and Latex    Social History:  The patient  reports that she has never smoked. She has never used smokeless tobacco. She reports current alcohol use. She reports that she does not use drugs.   Family History:  The patient's family history includes Bipolar disorder in her father and mother; Cancer in her mother.    ROS:  Please see the history of present illness.   Otherwise, review of systems are positive for palpitations, intentional weight loss.   All other systems are reviewed and negative.    PHYSICAL EXAM: VS:  BP 138/88   Pulse 80   Ht 5\' 8"  (1.727 m)   Wt (!) 353 lb 3.2 oz (160.2 kg)   SpO2 97%   BMI  53.70 kg/m  , BMI Body mass index is 53.7 kg/m. GEN: Well nourished, well developed, in no acute distress HEENT: normal Neck: no JVD, carotid bruits, or masses Cardiac: RRR; no murmurs, rubs, or gallops,no edema  Respiratory:  clear to auscultation bilaterally, normal work of breathing GI: soft, nontender, nondistended, + BS MS: no deformity or atrophy Skin: warm and dry, no rash Neuro:  Strength and sensation are intact Psych: euthymic mood, full affect   EKG:   The ekg ordered today demonstrates NSR, Nonspecific ST changes   Recent Labs: No results found for requested labs within last 8760 hours.   Lipid Panel    Component Value Date/Time   CHOL 139 08/06/2017 1122   TRIG 87 08/06/2017 1122   HDL 41 08/06/2017 1122   CHOLHDL 3.4 08/06/2017 1122   CHOLHDL 5.1 10/12/2016 0609   VLDL 50 (H) 10/12/2016 0609   LDLCALC 81 08/06/2017 1122     Other studies Reviewed: Additional studies/ records that were reviewed today with results demonstrating: prior ECG reviewed- will obtain recent labs from [facility].   ASSESSMENT AND PLAN:  HTN: The current medical regimen is effective;  continue present plan and medications. Controlled at MD visits. Morbid obesity: Steadily losing weight.  Trying to eat more protein.  LVH: Chronic diastolic heart failure.  Appears euvolemic.  Hyperlipidemia: Followed by [facility], her PMD.  Plant based diet recommended.  Plant sources of protein discussed as well.  Continue atorvastatin. Palpitations: Very infrequent.  Monitoring would be low yield.  She is also had atypical chest pains on and off for years.  I think it is unlikely that her symptoms are coming from obstructive coronary disease.    Current medicines are reviewed at length with the patient today.  The patient concerns regarding her medicines were addressed.  The following changes have been made:  No change  Labs/ tests ordered today include:  No orders of the defined types  were placed in this encounter.   Recommend 150 minutes/week of aerobic exercise Low fat, low carb, high fiber diet recommended  Disposition:   FU in 1 year with me or PA.    SignedLarae Grooms, MD  05/13/2021 11:42 AM    [facility] [place], [place], [place]  [address] Phone: [phone]; Fax: [phone]

## 2021-05-13 NOTE — Patient Instructions (Signed)
Medication Instructions:  Your physician recommends that you continue on your current medications as directed. Please refer to the Current Medication list given to you today.  *If you need a refill on your cardiac medications before your next appointment, please call your pharmacy*   Lab Work: none If you have labs (blood work) drawn today and your tests are completely normal, you will receive your results only by: [place] (if you have MyChart) OR A paper copy in the mail If you have any lab test that is abnormal or we need to change your treatment, we will call you to review the results.   Testing/Procedures: none   Follow-Up: At [facility], you and your health needs are our priority.  As part of our continuing mission to provide you with exceptional heart care, we have created designated Provider Care Teams.  These Care Teams include your primary Cardiologist (physician) and Advanced Practice Providers (APPs -  Physician Assistants and Nurse Practitioners) who all work together to provide you with the care you need, when you need it.  We recommend signing up for the patient portal called "MyChart".  Sign up information is provided on this After Visit Summary.  MyChart is used to connect with patients for Virtual Visits (Telemedicine).  Patients are able to view lab/test results, encounter notes, upcoming appointments, etc.  Non-urgent messages can be sent to your provider as well.   To learn more about what you can do with MyChart, go to [email or web address].    Your next appointment:   12 month(s)  The format for your next appointment:   In Person  Provider:   Larae Grooms, MD  or   Other Instructions

## 2021-09-20 IMAGING — CR DG SHOULDER 2+V*R*
4 series · 4 of 4 positions shown · non-contrast
Comparison: None.

CLINICAL DATA: Right shoulder pain and decreased range of motion

EXAM:
RIGHT SHOULDER - 2+ VIEW

[w shoulder ap internal righ *]
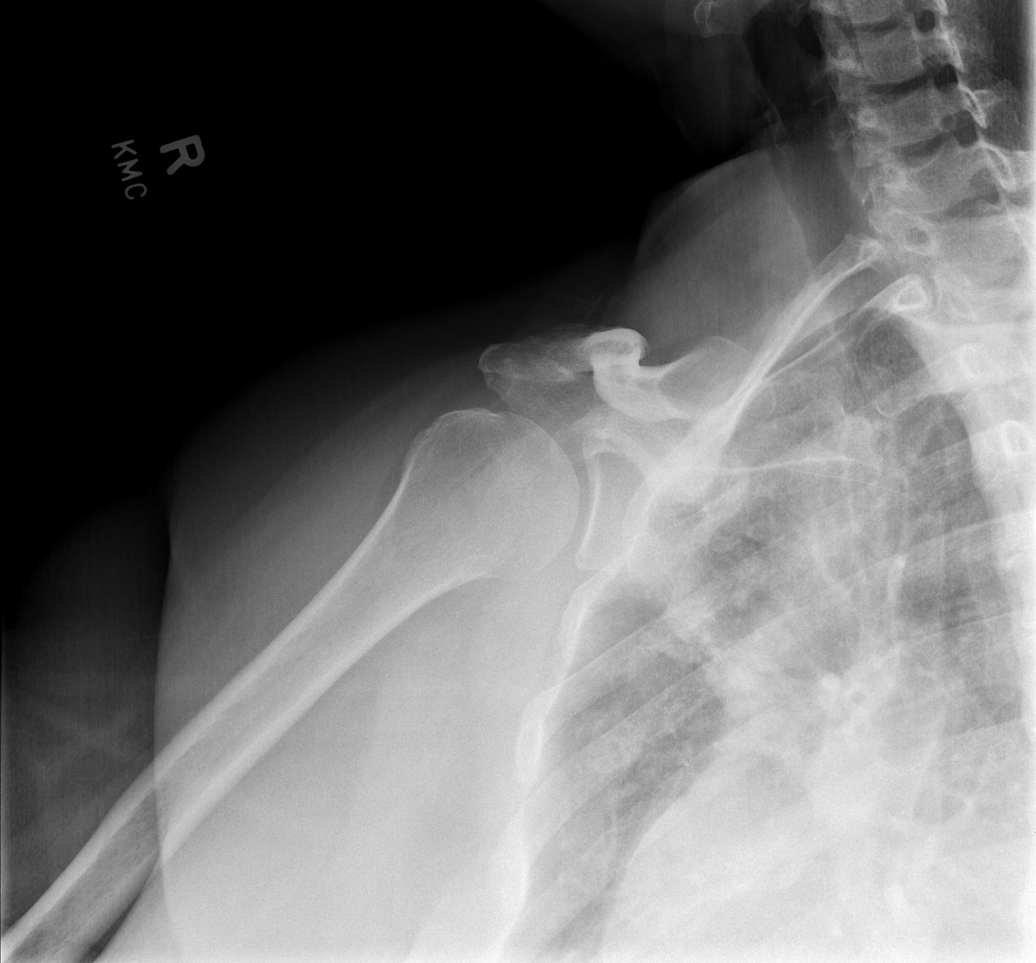

[w shoulder ap external righ *]
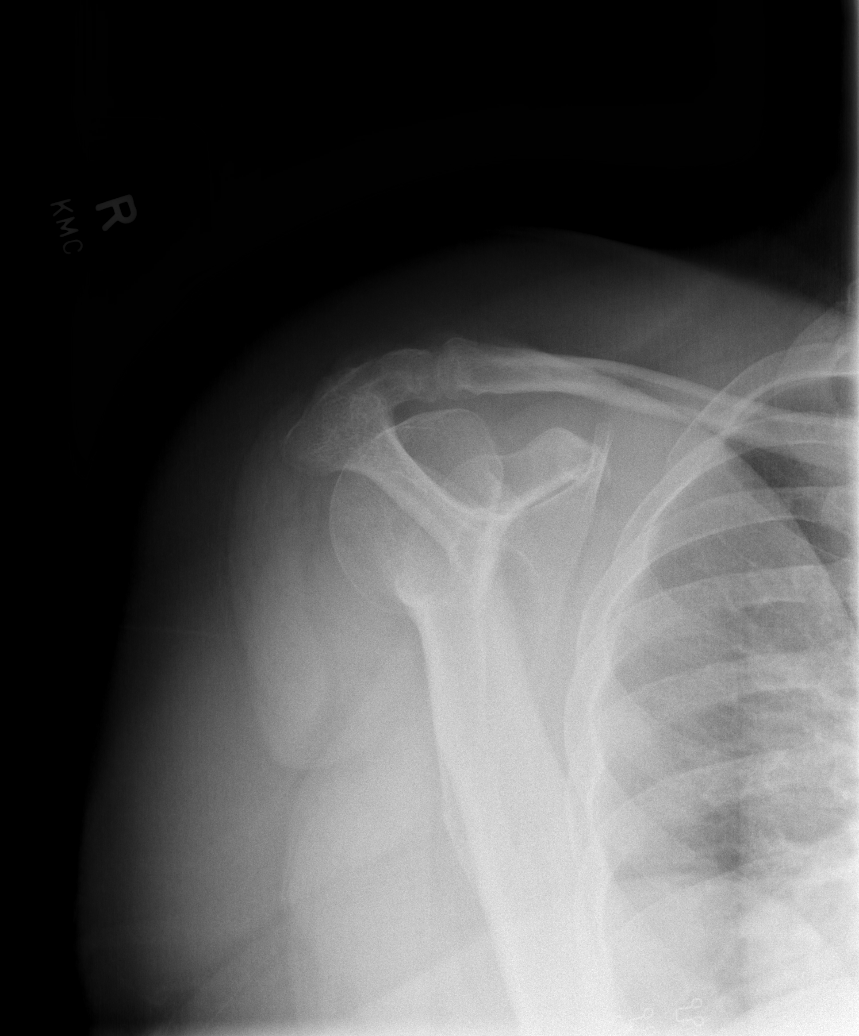

[x shoulder axillary right * (1 of 2)]
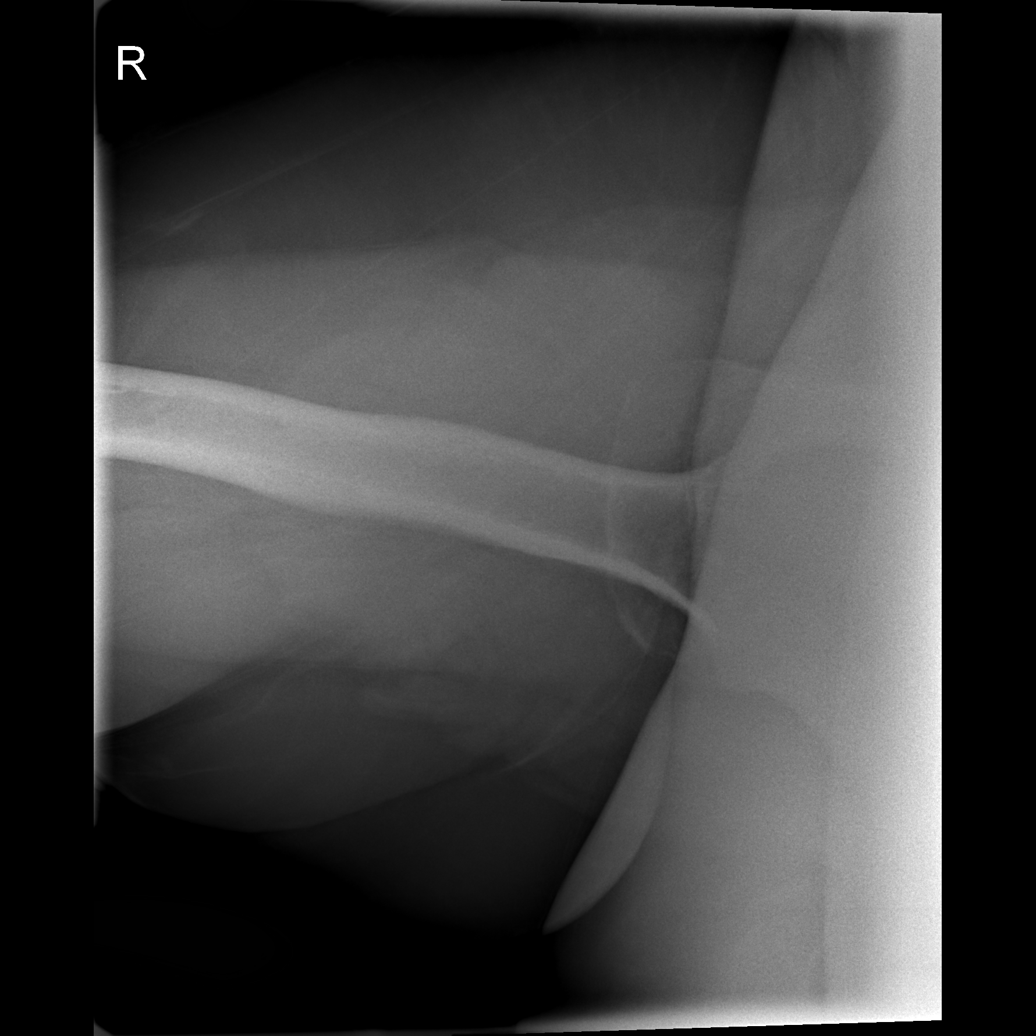

[x shoulder axillary right * (2 of 2)]
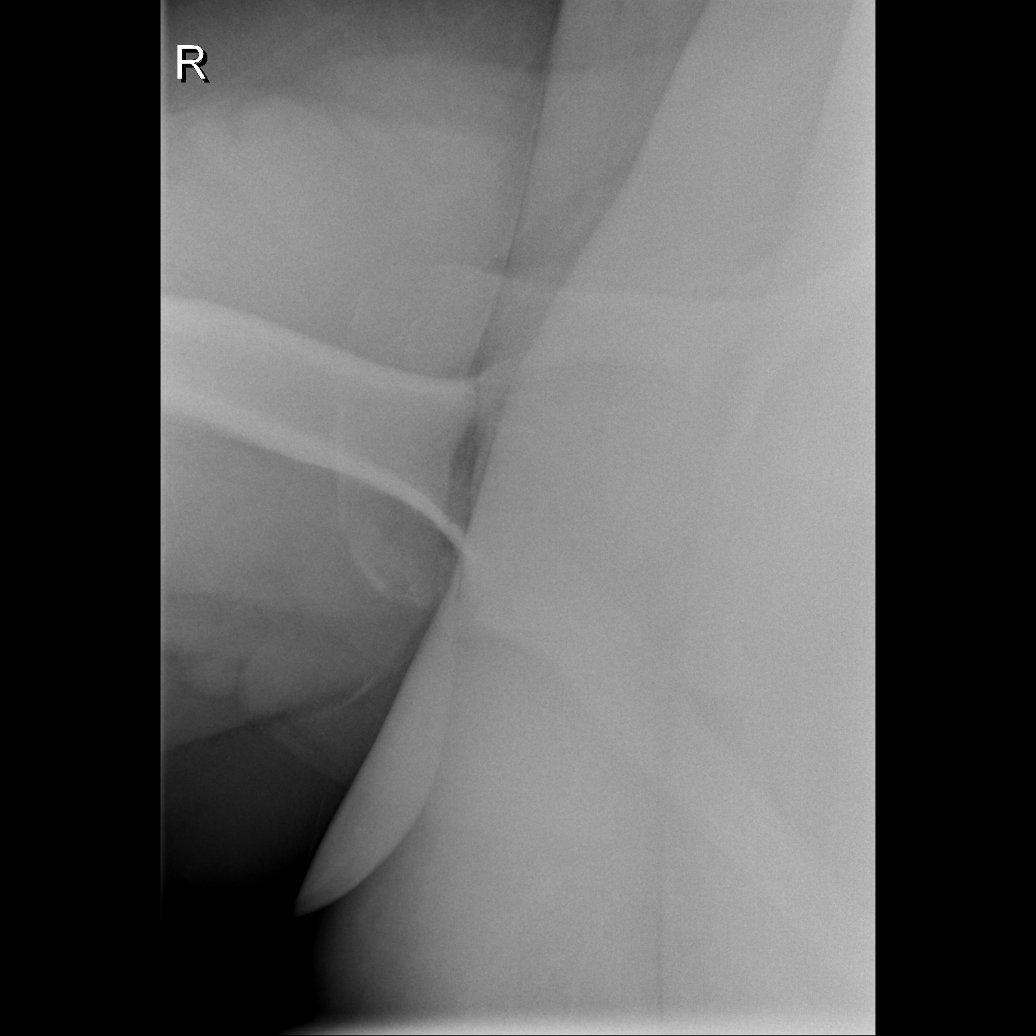

[4 of 4 positions shown; findings below may reference images not displayed]

FINDINGS: No fracture or dislocation. Mild joint space loss and spurring of
the acromioclavicular joint. Visualized soft tissues are
unremarkable. Limited axillary view.
IMPRESSION: No acute abnormality of the right shoulder.

## 2021-09-23 ENCOUNTER — Ambulatory Visit: Payer: Medicare HMO | Admitting: Physician Assistant

## 2022-02-11 ENCOUNTER — Ambulatory Visit (HOSPITAL_COMMUNITY)
Admission: EM | Admit: 2022-02-11 | Discharge: 2022-02-12 | Disposition: A | Discharge status: Other Acute Inpatient Hospital | Payer: Medicare HMO | Attending: Family | Admitting: Family

## 2022-02-11 DIAGNOSIS — F314 Bipolar disorder, current episode depressed, severe, without psychotic features: Secondary | ICD-10-CM | POA: Diagnosis not present

## 2022-02-11 DIAGNOSIS — Z9151 Personal history of suicidal behavior: Secondary | ICD-10-CM | POA: Insufficient documentation

## 2022-02-11 DIAGNOSIS — R45851 Suicidal ideations: Secondary | ICD-10-CM | POA: Diagnosis not present

## 2022-02-11 DIAGNOSIS — R5383 Other fatigue: Secondary | ICD-10-CM | POA: Insufficient documentation

## 2022-02-11 DIAGNOSIS — Z20822 Contact with and (suspected) exposure to covid-19: Secondary | ICD-10-CM | POA: Diagnosis not present

## 2022-02-11 DIAGNOSIS — F332 Major depressive disorder, recurrent severe without psychotic features: Secondary | ICD-10-CM | POA: Diagnosis present

## 2022-02-11 DIAGNOSIS — F630 Pathological gambling: Secondary | ICD-10-CM | POA: Insufficient documentation

## 2022-02-11 DIAGNOSIS — E119 Type 2 diabetes mellitus without complications: Secondary | ICD-10-CM | POA: Insufficient documentation

## 2022-02-11 DIAGNOSIS — R632 Polyphagia: Secondary | ICD-10-CM | POA: Insufficient documentation

## 2022-02-11 DIAGNOSIS — Z79899 Other long term (current) drug therapy: Secondary | ICD-10-CM | POA: Insufficient documentation

## 2022-02-11 LAB — URINALYSIS, ROUTINE W REFLEX MICROSCOPIC
Bilirubin Urine: NEGATIVE
Glucose, UA: NEGATIVE mg/dL
Ketones, ur: NEGATIVE mg/dL
Nitrite: NEGATIVE
Protein, ur: NEGATIVE mg/dL
Specific Gravity, Urine: 1.019 (ref 1.005–1.030)
pH: 5 (ref 5.0–8.0)

## 2022-02-11 LAB — COMPREHENSIVE METABOLIC PANEL
ALT: 13 U/L (ref 0–44)
AST: 18 U/L (ref 15–41)
Albumin: 3.9 g/dL (ref 3.5–5.0)
Alkaline Phosphatase: 68 U/L (ref 38–126)
Anion gap: 12 (ref 5–15)
BUN: 11 mg/dL (ref 6–20)
CO2: 19 mmol/L — ABNORMAL LOW (ref 22–32)
Calcium: 9.2 mg/dL (ref 8.9–10.3)
Chloride: 110 mmol/L (ref 98–111)
Creatinine, Ser: 0.69 mg/dL (ref 0.44–1.00)
GFR, Estimated: >60 mL/min (ref >60)
Glucose, Bld: 114 mg/dL — ABNORMAL HIGH (ref 70–99)
Potassium: 4 mmol/L (ref 3.5–5.1)
Sodium: 141 mmol/L (ref 135–145)
Total Bilirubin: 0.7 mg/dL (ref 0.3–1.2)
Total Protein: 7.1 g/dL (ref 6.5–8.1)

## 2022-02-11 LAB — LIPID PANEL
Cholesterol: 194 mg/dL (ref 0–200)
HDL: 52 mg/dL (ref >40)
LDL Cholesterol: 105 mg/dL — ABNORMAL HIGH (ref 0–99)
Total CHOL/HDL Ratio: 3.7 RATIO
Triglycerides: 183 mg/dL — ABNORMAL HIGH (ref <150)
VLDL: 37 mg/dL (ref 0–40)

## 2022-02-11 LAB — TSH: TSH: 1.429 u[IU]/mL (ref 0.350–4.500)

## 2022-02-11 LAB — CBC WITH DIFFERENTIAL/PLATELET
Abs Immature Granulocytes: 0.04 10*3/uL (ref 0.00–0.07)
Basophils Absolute: 0.1 10*3/uL (ref 0.0–0.1)
Basophils Relative: 1 %
Eosinophils Absolute: 0 10*3/uL (ref 0.0–0.5)
Eosinophils Relative: 1 %
HCT: 42.8 % (ref 36.0–46.0)
Hemoglobin: 13.7 g/dL (ref 12.0–15.0)
Immature Granulocytes: 1 %
Lymphocytes Relative: 33 %
Lymphs Abs: 2.9 10*3/uL (ref 0.7–4.0)
MCH: 26.8 pg (ref 26.0–34.0)
MCHC: 32 g/dL (ref 30.0–36.0)
MCV: 83.8 fL (ref 80.0–100.0)
Monocytes Absolute: 0.9 10*3/uL (ref 0.1–1.0)
Monocytes Relative: 10 %
Neutro Abs: 4.9 10*3/uL (ref 1.7–7.7)
Neutrophils Relative %: 54 %
Platelets: 401 10*3/uL — ABNORMAL HIGH (ref 150–400)
RBC: 5.11 MIL/uL (ref 3.87–5.11)
RDW: 12.2 % (ref 11.5–15.5)
WBC: 8.7 10*3/uL (ref 4.0–10.5)
nRBC: 0 % (ref 0.0–0.2)

## 2022-02-11 LAB — POCT URINE DRUG SCREEN - MANUAL ENTRY (I-SCREEN)
POC Amphetamine UR: NOT DETECTED
POC Buprenorphine (BUP): NOT DETECTED
POC Cocaine UR: NOT DETECTED
POC Marijuana UR: NOT DETECTED
POC Methadone UR: NOT DETECTED
POC Methamphetamine UR: NOT DETECTED
POC Morphine: NOT DETECTED
POC Oxazepam (BZO): NOT DETECTED
POC Oxycodone UR: NOT DETECTED
POC Secobarbital (BAR): NOT DETECTED

## 2022-02-11 LAB — PREGNANCY, URINE: Preg Test, Ur: NEGATIVE

## 2022-02-11 LAB — ETHANOL: Alcohol, Ethyl (B): <10 mg/dL (ref <10)

## 2022-02-11 LAB — RESP PANEL BY RT-PCR (FLU A&B, COVID) ARPGX2
Influenza A by PCR: NEGATIVE
Influenza B by PCR: NEGATIVE
SARS Coronavirus 2 by RT PCR: NEGATIVE

## 2022-02-11 LAB — POC SARS CORONAVIRUS 2 AG: SARSCOV2ONAVIRUS 2 AG: NEGATIVE

## 2022-02-11 LAB — HEMOGLOBIN A1C
Hgb A1c MFr Bld: 4.9 % (ref 4.8–5.6)
Mean Plasma Glucose: 93.93 mg/dL

## 2022-02-11 LAB — MAGNESIUM: Magnesium: 2 mg/dL (ref 1.7–2.4)

## 2022-02-11 MED ORDER — ALUM & MAG HYDROXIDE-SIMETH 200-200-20 MG/5ML PO SUSP: 30.0000 mL | ORAL | Status: DC | PRN

## 2022-02-11 MED ORDER — CARIPRAZINE HCL 1.5 MG PO CAPS
1.5000 mg | ORAL_CAPSULE | Freq: Every day | ORAL | Status: DC
  Administered 2022-02-11 – 2022-02-12 (×2): 1.5 mg via ORAL
  Filled 2022-02-11 (×2): qty 1

## 2022-02-11 MED ORDER — MELATONIN 3 MG PO TABS
3.0000 mg | ORAL_TABLET | Freq: Every day | ORAL | Status: DC
  Administered 2022-02-11: 3 mg via ORAL
  Filled 2022-02-11: qty 1

## 2022-02-11 MED ORDER — ADULT MULTIVITAMIN W/MINERALS CH
1.0000 | ORAL_TABLET | Freq: Once | ORAL | Status: AC
  Administered 2022-02-11: 1 via ORAL
  Filled 2022-02-11: qty 1

## 2022-02-11 MED ORDER — MAGNESIUM HYDROXIDE 400 MG/5ML PO SUSP: 30.0000 mL | Freq: Every day | ORAL | Status: DC | PRN

## 2022-02-11 MED ORDER — LEVOTHYROXINE SODIUM 25 MCG PO TABS
50.0000 ug | ORAL_TABLET | Freq: Every day | ORAL | Status: DC
  Administered 2022-02-12: 50 ug via ORAL
  Filled 2022-02-11: qty 2

## 2022-02-11 MED ORDER — HYDROXYZINE HCL 25 MG PO TABS
25.0000 mg | ORAL_TABLET | Freq: Three times a day (TID) | ORAL | Status: DC | PRN
  Administered 2022-02-12: 25 mg via ORAL
  Filled 2022-02-11: qty 1

## 2022-02-11 MED ORDER — ACETAMINOPHEN 325 MG PO TABS
650.0000 mg | ORAL_TABLET | Freq: Four times a day (QID) | ORAL | Status: DC | PRN
  Administered 2022-02-11 – 2022-02-12 (×2): 650 mg via ORAL
  Filled 2022-02-11 (×2): qty 2

## 2022-02-11 MED ORDER — AMLODIPINE BESYLATE 10 MG PO TABS
10.0000 mg | ORAL_TABLET | Freq: Every day | ORAL | Status: DC
  Administered 2022-02-12: 10 mg via ORAL
  Filled 2022-02-11: qty 1

## 2022-02-11 MED ORDER — ATORVASTATIN CALCIUM 10 MG PO TABS
20.0000 mg | ORAL_TABLET | Freq: Every day | ORAL | Status: DC
  Administered 2022-02-11 – 2022-02-12 (×2): 20 mg via ORAL
  Filled 2022-02-11 (×2): qty 2

## 2022-02-11 MED ORDER — LOSARTAN POTASSIUM 50 MG PO TABS
25.0000 mg | ORAL_TABLET | Freq: Every day | ORAL | Status: DC
  Administered 2022-02-12: 25 mg via ORAL
  Filled 2022-02-11: qty 1

## 2022-02-11 NOTE — ED Notes (Signed)
Patient admitted to [facility] endorsing SI, and depression.  Patient was cooperative during the admission assessment. Skin assessment complete. Belongings inventoried. Patient oriented to unit and unit rules. Meal and drinks offered to patient. Patient verbalized agreement to treatment plans. Patient cannot verbally contract for safety during hospitalization. Will continue to monitor for safety.

## 2022-02-11 NOTE — ED Notes (Signed)
Pt sleeping@this time. Breathing even and unlabored. Will continue to monitor for safety 
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
## 2022-02-11 NOTE — Progress Notes (Signed)
   02/11/22 1500  [place] (Walk-ins at [facility] only)  What Is the Reason for Your Visit/Call Today? Patient presents reporting worsening depression and SI stating she is struggling financially and worries she may lose her apartment.  Another stressor she was hesitant to share, given her concerns she could lose SSD income, is her gambling addiction.  She reports she is spending much of her income on gambling at the [place] [place].  Patient states she has been researching various plans for "quick, painless" suicide options.  She does not discuss a specific suicide plan, however reports she has considered several.  Patient was prescribed medications by Jobie Quaker, NP of [facility], howver she discontinued Abilify several months ago.  She did not feel the medication was working, and she "lost interest in taking care of myself."  She reports she is overeating and expresses concern she may be diabetic.  She also admits to having unprotected sex, stating she just doesn't care about herself.  Patient denies HI, AVH or SA hx.  She is unable to contract for safety at this time.  How Long Has This Been Causing You Problems? > than 6 months  Have You Recently Had Any Thoughts About Hurting Yourself? Yes  How long ago did you have thoughts about hurting yourself? today  Are You Planning to [place] At This time? Yes  Have you Recently Had Thoughts About Hurting Someone [name]? No  Are You Planning To Harm Someone At This Time? No  Are you currently experiencing any auditory, visual or other hallucinations? No  Have You Used Any Alcohol or Drugs in the Past 24 Hours? No  Do you have any current medical co-morbidities that require immediate attention? No  Clinician description of patient physical appearance/behavior: Patient is calm, cooperative AAOx5  What Do You Feel Would Help You the Most Today? Treatment for Depression or other mood problem  If access to [facility] was not available, would you have sought care in the Emergency Department? No  Determination of Need Urgent (48 hours)  Options For Referral Inpatient Hospitalization;Facility-Based Crisis;BH Urgent Care

## 2022-02-11 NOTE — ED Notes (Signed)
Pt is awake calm cn cooperative. She has c/o body pain (see Mar) will continue to monitor or safety
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
## 2022-02-11 NOTE — ED Notes (Signed)
Patient states she has had a gastric bypass. Patient request a multivitamin...states she usually takes at night.

## 2022-02-12 ENCOUNTER — Encounter (HOSPITAL_COMMUNITY): Payer: Self-pay | Admitting: Family

## 2022-02-12 ENCOUNTER — Inpatient Hospital Stay (HOSPITAL_COMMUNITY)
Admission: AD | Admit: 2022-02-12 | Discharge: 2022-02-18 | DRG: 885 | Disposition: A | Discharge status: Home / Self Care | Payer: Medicare HMO | Source: Intra-hospital | Attending: Psychiatry | Admitting: Psychiatry

## 2022-02-12 ENCOUNTER — Other Ambulatory Visit: Payer: Self-pay

## 2022-02-12 DIAGNOSIS — E785 Hyperlipidemia, unspecified: Secondary | ICD-10-CM | POA: Diagnosis present

## 2022-02-12 DIAGNOSIS — F411 Generalized anxiety disorder: Secondary | ICD-10-CM | POA: Diagnosis present

## 2022-02-12 DIAGNOSIS — F3113 Bipolar disorder, current episode manic without psychotic features, severe: Secondary | ICD-10-CM | POA: Diagnosis present

## 2022-02-12 DIAGNOSIS — Z79899 Other long term (current) drug therapy: Secondary | ICD-10-CM | POA: Diagnosis not present

## 2022-02-12 DIAGNOSIS — F630 Pathological gambling: Secondary | ICD-10-CM | POA: Diagnosis present

## 2022-02-12 DIAGNOSIS — R45851 Suicidal ideations: Secondary | ICD-10-CM | POA: Diagnosis present

## 2022-02-12 DIAGNOSIS — Z9151 Personal history of suicidal behavior: Secondary | ICD-10-CM

## 2022-02-12 DIAGNOSIS — G43909 Migraine, unspecified, not intractable, without status migrainosus: Secondary | ICD-10-CM | POA: Diagnosis present

## 2022-02-12 DIAGNOSIS — Z818 Family history of other mental and behavioral disorders: Secondary | ICD-10-CM

## 2022-02-12 DIAGNOSIS — E282 Polycystic ovarian syndrome: Secondary | ICD-10-CM | POA: Diagnosis present

## 2022-02-12 DIAGNOSIS — G4733 Obstructive sleep apnea (adult) (pediatric): Secondary | ICD-10-CM | POA: Diagnosis present

## 2022-02-12 DIAGNOSIS — Z9884 Bariatric surgery status: Secondary | ICD-10-CM | POA: Diagnosis not present

## 2022-02-12 DIAGNOSIS — G4700 Insomnia, unspecified: Secondary | ICD-10-CM | POA: Diagnosis present

## 2022-02-12 DIAGNOSIS — N390 Urinary tract infection, site not specified: Secondary | ICD-10-CM | POA: Diagnosis present

## 2022-02-12 DIAGNOSIS — Z20822 Contact with and (suspected) exposure to covid-19: Secondary | ICD-10-CM | POA: Diagnosis present

## 2022-02-12 DIAGNOSIS — G4730 Sleep apnea, unspecified: Secondary | ICD-10-CM | POA: Diagnosis present

## 2022-02-12 DIAGNOSIS — I10 Essential (primary) hypertension: Secondary | ICD-10-CM | POA: Diagnosis present

## 2022-02-12 DIAGNOSIS — F314 Bipolar disorder, current episode depressed, severe, without psychotic features: Secondary | ICD-10-CM | POA: Diagnosis present

## 2022-02-12 DIAGNOSIS — F332 Major depressive disorder, recurrent severe without psychotic features: Secondary | ICD-10-CM | POA: Insufficient documentation

## 2022-02-12 DIAGNOSIS — F4310 Post-traumatic stress disorder, unspecified: Secondary | ICD-10-CM | POA: Diagnosis present

## 2022-02-12 LAB — HEPATITIS PANEL, ACUTE
HCV Ab: NONREACTIVE
Hep A IgM: NONREACTIVE
Hep B C IgM: NONREACTIVE
Hepatitis B Surface Ag: NONREACTIVE

## 2022-02-12 LAB — GLUCOSE, CAPILLARY: Glucose-Capillary: 101 mg/dL — ABNORMAL HIGH (ref 70–99)

## 2022-02-12 LAB — RPR: RPR Ser Ql: NONREACTIVE

## 2022-02-12 MED ORDER — MAGNESIUM HYDROXIDE 400 MG/5ML PO SUSP: 30.0000 mL | Freq: Every day | ORAL | Status: DC | PRN

## 2022-02-12 MED ORDER — AMLODIPINE BESYLATE 10 MG PO TABS
10.0000 mg | ORAL_TABLET | Freq: Every day | ORAL | Status: DC
  Administered 2022-02-13 – 2022-02-18 (×6): 10 mg via ORAL
  Filled 2022-02-12 (×7): qty 1

## 2022-02-12 MED ORDER — HYDROXYZINE HCL 25 MG PO TABS
25.0000 mg | ORAL_TABLET | Freq: Three times a day (TID) | ORAL | Status: DC | PRN
  Administered 2022-02-12 – 2022-02-14 (×3): 25 mg via ORAL
  Filled 2022-02-12 (×4): qty 1

## 2022-02-12 MED ORDER — ADULT MULTIVITAMIN W/MINERALS CH
1.0000 | ORAL_TABLET | Freq: Every day | ORAL | Status: DC
  Administered 2022-02-13 – 2022-02-18 (×6): 1 via ORAL
  Filled 2022-02-12 (×8): qty 1

## 2022-02-12 MED ORDER — ATORVASTATIN CALCIUM 20 MG PO TABS
20.0000 mg | ORAL_TABLET | Freq: Every day | ORAL | Status: DC
  Administered 2022-02-13 – 2022-02-18 (×6): 20 mg via ORAL
  Filled 2022-02-12 (×7): qty 1

## 2022-02-12 MED ORDER — LOSARTAN POTASSIUM 25 MG PO TABS
25.0000 mg | ORAL_TABLET | Freq: Every day | ORAL | Status: DC
  Administered 2022-02-13 – 2022-02-18 (×6): 25 mg via ORAL
  Filled 2022-02-12 (×7): qty 1

## 2022-02-12 MED ORDER — CARIPRAZINE HCL 1.5 MG PO CAPS
1.5000 mg | ORAL_CAPSULE | Freq: Every day | ORAL | Status: DC
  Administered 2022-02-13 – 2022-02-15 (×3): 1.5 mg via ORAL
  Filled 2022-02-12 (×5): qty 1

## 2022-02-12 MED ORDER — ACETAMINOPHEN 325 MG PO TABS
650.0000 mg | ORAL_TABLET | Freq: Four times a day (QID) | ORAL | Status: DC | PRN
  Administered 2022-02-12 – 2022-02-13 (×2): 650 mg via ORAL
  Filled 2022-02-12 (×2): qty 2

## 2022-02-12 MED ORDER — MELATONIN 3 MG PO TABS
3.0000 mg | ORAL_TABLET | Freq: Every day | ORAL | Status: DC
  Administered 2022-02-12 – 2022-02-17 (×6): 3 mg via ORAL
  Filled 2022-02-12 (×8): qty 1

## 2022-02-12 MED ORDER — ALUM & MAG HYDROXIDE-SIMETH 200-200-20 MG/5ML PO SUSP: 30.0000 mL | ORAL | Status: DC | PRN

## 2022-02-12 MED ORDER — LEVOTHYROXINE SODIUM 50 MCG PO TABS
50.0000 ug | ORAL_TABLET | Freq: Every day | ORAL | Status: DC
  Administered 2022-02-13 – 2022-02-18 (×6): 50 ug via ORAL
  Filled 2022-02-12 (×7): qty 1
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
## 2022-02-12 NOTE — ED Notes (Signed)
Report called to Abby RN at [facility]. Voluntary consent form previously faxed and confirmation received.

## 2022-02-12 NOTE — ED Notes (Signed)
Voluntary consent form faxed to [facility]. Confirmation received.

## 2022-02-12 NOTE — ED Notes (Signed)
[facility] called for transportation services to [facility].

## 2022-02-12 NOTE — Tx Team (Signed)
Initial Treatment Plan 02/12/2022 5:32 PM [name] [id number]    PATIENT STRESSORS: Financial difficulties   Health problems     PATIENT STRENGTHS: Ability for insight  Religious Affiliation    PATIENT IDENTIFIED PROBLEMS:                      DISCHARGE CRITERIA:  Ability to meet basic life and health needs Adequate post-discharge living arrangements  PRELIMINARY DISCHARGE PLAN: Outpatient therapy Return to previous living arrangement Return to previous work or school arrangements  PATIENT/FAMILY INVOLVEMENT: This treatment plan has been presented to and reviewed with the patient, Renee Steele, and/or family member, .  The patient and family have been given the opportunity to ask questions and make suggestions.  Johann Capers, RN 02/12/2022, 5:32 PM

## 2022-02-12 NOTE — Plan of Care (Signed)
  Problem: Education: Goal: Knowledge of [place] [facility] Education information/materials will improve Outcome: Progressing Goal: Emotional status will improve Outcome: Progressing Goal: Mental status will improve Outcome: Progressing Goal: Verbalization of understanding the information provided will improve Outcome: Progressing   Problem: Activity: Goal: Interest or engagement in activities will improve Outcome: Progressing Goal: Sleeping patterns will improve Outcome: Progressing   Problem: Coping: Goal: Ability to verbalize frustrations and anger appropriately will improve Outcome: Progressing Goal: Ability to demonstrate self-control will improve Outcome: Progressing   Problem: Health Behavior/Discharge Planning: Goal: Identification of resources available to assist in meeting health care needs will improve Outcome: Progressing Goal: Compliance with treatment plan for underlying cause of condition will improve Outcome: Progressing   Problem: Physical Regulation: Goal: Ability to maintain clinical measurements within normal limits will improve Outcome: Progressing   Problem: Safety: Goal: Periods of time without injury will increase Outcome: Progressing   

## 2022-02-12 NOTE — ED Notes (Signed)
Pt sleeping@this time. Breathing even and unlabored. Will continue to monitor for safety 

## 2022-02-12 NOTE — Progress Notes (Addendum)
Pt was accepted to [facility] today 02/12/22; Bed Assignment 305-1  DX: MDD  Pt meets inpatient criteria per Beatriz Stallion, FNP  Attending Physician will be Nadir Winfred Leeds, MD  Report can be called to: Adult unit: [phone]  Pt's bed is physically not empty this is a DISCHARGING BED.   Care Team Notified: Beatriz Stallion, FNP, [facility] Leonia Reader, RN, Rikki Spearing, RN, Leota Jacobsen, LPN, Lissa Merlin.  Nadara Mode, LCSWA 02/12/2022 @ 10:32 AM

## 2022-02-12 NOTE — ED Notes (Signed)
Pt stated she was having SI without a plan. Denied HI/AVH. Stated that she knew she had bad dreams during the night because she kept waking up. Reassured Pt that she would be going to [facility] later on today and that we were glad she came into the facility to receive help. Pt converses with other patients and staff well. Received scheduled morning meds w/o difficulty. Safety maintained and will continue to monitor.

## 2022-02-12 NOTE — ED Notes (Signed)
Pt c/o pain to the right wrist and increased anxiety. Admin PRN Atarax and PRN Tylenol per request.

## 2022-02-12 NOTE — ED Notes (Signed)
Discharge instructions provided and Pt stated understanding. Pt alert, orient and ambulatory prior to d/c from facility. Personal belongings returned from locker and dryer. Report previously called. Copy of voluntary consent and emtala placed in envelope and given to [employer] driver. Safe transport called for transportation services. Pt escorted to the sally port. Safety maintained.

## 2022-02-12 NOTE — BHH Group Notes (Signed)
The focus of this group is to help patients review their daily goal of treatment and discuss progress on daily workbooks. Pt was attentive and appropriate during tonight's wrap up group. Pt shared that she didn't feel seen at last facility and hopes things are different here. Pt stated that she is learning to cooperate with the process and not to rush the process. Pt shared that she is learning patience and overall day continue to get better and the day go on.

## 2022-02-13 ENCOUNTER — Encounter (HOSPITAL_COMMUNITY): Payer: Self-pay | Admitting: Family

## 2022-02-13 ENCOUNTER — Encounter (HOSPITAL_COMMUNITY): Payer: Self-pay

## 2022-02-13 DIAGNOSIS — Z9884 Bariatric surgery status: Secondary | ICD-10-CM | POA: Insufficient documentation

## 2022-02-13 DIAGNOSIS — F3113 Bipolar disorder, current episode manic without psychotic features, severe: Secondary | ICD-10-CM

## 2022-02-13 DIAGNOSIS — F630 Pathological gambling: Secondary | ICD-10-CM | POA: Diagnosis present

## 2022-02-13 DIAGNOSIS — N390 Urinary tract infection, site not specified: Secondary | ICD-10-CM | POA: Diagnosis present

## 2022-02-13 LAB — PROLACTIN: Prolactin: 8.8 ng/mL (ref 4.8–23.3)

## 2022-02-13 MED ORDER — TOPIRAMATE 25 MG PO TABS
25.0000 mg | ORAL_TABLET | Freq: Two times a day (BID) | ORAL | Status: DC
  Administered 2022-02-13 – 2022-02-18 (×10): 25 mg via ORAL
  Filled 2022-02-13 (×12): qty 1

## 2022-02-13 MED ORDER — LORAZEPAM 0.5 MG PO TABS
0.5000 mg | ORAL_TABLET | Freq: Two times a day (BID) | ORAL | Status: AC
Start: 2022-02-13 — End: 2022-02-15
  Administered 2022-02-13 – 2022-02-15 (×4): 0.5 mg via ORAL
  Filled 2022-02-13 (×4): qty 1

## 2022-02-13 MED ORDER — NITROFURANTOIN MONOHYD MACRO 100 MG PO CAPS
100.0000 mg | ORAL_CAPSULE | Freq: Two times a day (BID) | ORAL | Status: DC
  Administered 2022-02-13 – 2022-02-18 (×11): 100 mg via ORAL
  Filled 2022-02-13 (×13): qty 1

## 2022-02-13 NOTE — Progress Notes (Signed)
Pt denied SI/HI/AVH.  Pt reported urgency, burning, and frequency with urination and pt was given antibiotics.  Pt took medications without incident and no adverse drug reactions were noted.  RN will continue to monitor pt's progress and provide support as needed.

## 2022-02-13 NOTE — Group Note (Signed)
[facility] LCSW Group Therapy Note   Group Date: 02/13/2022 Start Time: 1300 End Time: 1400   Type of Therapy and Topic: Group Therapy: Avoiding Self-Sabotaging and Enabling Behaviors  Participation Level: Active  Description of Group:  In this group, patients will learn how to identify obstacles, self-sabotaging and enabling behaviors, as well as: what are they, why do we do them and what needs these behaviors meet. Discuss unhealthy relationships and how to have positive healthy boundaries with those that sabotage and enable. Explore aspects of self-sabotage and enabling in yourself and how to limit these self-destructive behaviors in everyday life.   Therapeutic Goals: 1. Patient will identify one obstacle that relates to self-sabotage and enabling behaviors 2. Patient will identify one personal self-sabotaging or enabling behavior they did prior to admission 3. Patient will state a plan to change the above identified behavior 4. Patient will demonstrate ability to communicate their needs through discussion and/or role play.    Summary of Patient Progress:   Patient was present for the entirety of the group session. Patient was an active listener and participated in the topic of discussion, provided helpful advice to others, and added nuance to topic of conversation.    Therapeutic Modalities:  Cognitive Behavioral Therapy Person-Centered Therapy Motivational Interviewing    Durenda Hurt, [place]
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
## 2022-02-13 NOTE — Group Note (Signed)
Date:  02/13/2022 Time:  10:16 AM  Group Topic/Focus:  Orientation:   The focus of this group is to educate the patient on the purpose and policies of crisis stabilization and provide a format to answer questions about their admission.  The group details unit policies and expectations of patients while admitted.    Participation Level:  Active  Participation Quality:  Appropriate  Affect:  Appropriate  Cognitive:  Appropriate  Insight: Good  Engagement in Group:  Engaged  Modes of Intervention:  Discussion  Additional Comments:     Jerrye Beavers 02/13/2022, 10:16 AM

## 2022-02-13 NOTE — BH IP Treatment Plan (Signed)
Interdisciplinary Treatment and Diagnostic Plan Update  02/13/2022 Time of Session: 0830 HOLLIN CREWE MRN: [id number]  Principal Diagnosis: Bipolar I disorder, current or most recent episode manic, severe ([place])  Secondary Diagnoses: Principal Problem:   Bipolar I disorder, current or most recent episode manic, severe ([place]) Active Problems:   UTI (urinary tract infection)   Gambling disorder, persistent   Current Medications:  Current Facility-Administered Medications  Medication Dose Route Frequency Provider Last Rate Last Admin   acetaminophen (TYLENOL) tablet 650 mg  650 mg Oral Q6H PRN Lucky Rathke, FNP   650 mg at 02/13/22 0638   alum & mag hydroxide-simeth (MAALOX/MYLANTA) 200-200-20 MG/5ML suspension 30 mL  30 mL Oral Q4H PRN Lucky Rathke, FNP       amLODipine (NORVASC) tablet 10 mg  10 mg Oral Daily Lucky Rathke, FNP   10 mg at 02/13/22 0906   atorvastatin (LIPITOR) tablet 20 mg  20 mg Oral Daily Lucky Rathke, FNP   20 mg at 02/13/22 7616   cariprazine (VRAYLAR) capsule 1.5 mg  1.5 mg Oral Daily Lucky Rathke, FNP   1.5 mg at 02/13/22 0737   hydrOXYzine (ATARAX) tablet 25 mg  25 mg Oral TID PRN Lucky Rathke, FNP   25 mg at 02/12/22 2124   levothyroxine (SYNTHROID) tablet 50 mcg  50 mcg Oral Daily Lucky Rathke, FNP   50 mcg at 02/13/22 1062   LORazepam (ATIVAN) tablet 0.5 mg  0.5 mg Oral BID Maida Sale, MD       losartan (COZAAR) tablet 25 mg  25 mg Oral Daily Lucky Rathke, FNP   25 mg at 02/13/22 0906   magnesium hydroxide (MILK OF MAGNESIA) suspension 30 mL  30 mL Oral Daily PRN Lucky Rathke, FNP       melatonin tablet 3 mg  3 mg Oral QHS Lucky Rathke, FNP   3 mg at 02/12/22 2124   multivitamin with minerals tablet 1 tablet  1 tablet Oral Daily Lucky Rathke, FNP   1 tablet at 02/13/22 6948   nitrofurantoin (macrocrystal-monohydrate) (MACROBID) capsule 100 mg  100 mg Oral Q12H Hill, Jackie Plum, MD   100 mg at 02/13/22 1113   topiramate (TOPAMAX)  tablet 25 mg  25 mg Oral BID Maida Sale, MD       PTA Medications: Medications Prior to Admission  Medication Sig Dispense Refill Last Dose   amLODipine (NORVASC) 10 MG tablet Take 1 tablet (10 mg total) by mouth daily. MUST MAKE APPT FOR FURTHER REFILLS 30 tablet 0    atorvastatin (LIPITOR) 20 MG tablet Take 1 tablet (20 mg total) by mouth daily. 30 tablet 3    cetirizine (ZYRTEC) 10 MG tablet Take 10 mg by mouth daily.      Cholecalciferol (VITAMIN D-3 PO) Take 1 tablet by mouth daily.      fluticasone (FLONASE) 50 MCG/ACT nasal spray Place 1 spray into both nostrils daily as needed for allergies.      levothyroxine (SYNTHROID, LEVOTHROID) 50 MCG tablet Take 1 tablet (50 mcg total) by mouth daily. 30 tablet 5    losartan (COZAAR) 25 MG tablet Take 25 mg by mouth daily.      MELATONIN GUMMIES PO Take 2 tablets by mouth at bedtime.      Multiple Vitamin (MULTIVITAMIN WITH MINERALS) TABS tablet Take 1 tablet by mouth daily.      OVER THE COUNTER MEDICATION Take 1 tablet by mouth daily.  Biotin Soft Chews       Patient Stressors: Financial difficulties   Health problems    Patient Strengths: Ability for insight  Religious Affiliation   Treatment Modalities: Medication Management, Group therapy, Case management,  1 to 1 session with clinician, Psychoeducation, Recreational therapy.   Physician Treatment Plan for Primary Diagnosis: Bipolar I disorder, current or most recent episode manic, severe ([place]) Long Term Goal(s): Improvement in symptoms so as ready for discharge   Short Term Goals: Ability to identify changes in lifestyle to reduce recurrence of condition will improve Ability to verbalize feelings will improve Ability to disclose and discuss suicidal ideas Ability to demonstrate self-control will improve Ability to identify and develop effective coping behaviors will improve Ability to maintain clinical measurements within normal limits will improve Compliance with  prescribed medications will improve  Medication Management: Evaluate patient's response, side effects, and tolerance of medication regimen.  Therapeutic Interventions: 1 to 1 sessions, Unit Group sessions and Medication administration.  Evaluation of Outcomes: Progressing  Physician Treatment Plan for Secondary Diagnosis: Principal Problem:   Bipolar I disorder, current or most recent episode manic, severe ([place]) Active Problems:   UTI (urinary tract infection)   Gambling disorder, persistent  Long Term Goal(s): Improvement in symptoms so as ready for discharge   Short Term Goals: Ability to identify changes in lifestyle to reduce recurrence of condition will improve Ability to verbalize feelings will improve Ability to disclose and discuss suicidal ideas Ability to demonstrate self-control will improve Ability to identify and develop effective coping behaviors will improve Ability to maintain clinical measurements within normal limits will improve Compliance with prescribed medications will improve     Medication Management: Evaluate patient's response, side effects, and tolerance of medication regimen.  Therapeutic Interventions: 1 to 1 sessions, Unit Group sessions and Medication administration.  Evaluation of Outcomes: Progressing   RN Treatment Plan for Primary Diagnosis: Bipolar I disorder, current or most recent episode manic, severe ([place]) Long Term Goal(s): Knowledge of disease and therapeutic regimen to maintain health will improve  Short Term Goals: Ability to remain free from injury will improve, Ability to verbalize frustration and anger appropriately will improve, Ability to demonstrate self-control, Ability to participate in decision making will improve, Ability to verbalize feelings will improve, Ability to disclose and discuss suicidal ideas, Ability to identify and develop effective coping behaviors will improve, and Compliance with prescribed medications will  improve  Medication Management: RN will administer medications as ordered by provider, will assess and evaluate patient's response and provide education to patient for prescribed medication. RN will report any adverse and/or side effects to prescribing provider.  Therapeutic Interventions: 1 on 1 counseling sessions, Psychoeducation, Medication administration, Evaluate responses to treatment, Monitor vital signs and CBGs as ordered, Perform/monitor CIWA, COWS, AIMS and Fall Risk screenings as ordered, Perform wound care treatments as ordered.  Evaluation of Outcomes: Progressing   LCSW Treatment Plan for Primary Diagnosis: Bipolar I disorder, current or most recent episode manic, severe ([place]) Long Term Goal(s): Safe transition to appropriate next level of care at discharge, Engage patient in therapeutic group addressing interpersonal concerns.  Short Term Goals: Engage patient in aftercare planning with referrals and resources, Increase social support, Increase ability to appropriately verbalize feelings, Increase emotional regulation, Facilitate acceptance of mental health diagnosis and concerns, Facilitate patient progression through stages of change regarding substance use diagnoses and concerns, Identify triggers associated with mental health/substance abuse issues, and Increase skills for wellness and recovery  Therapeutic Interventions: Assess for all  discharge needs, 1 to 1 time with Education officer, museum, Explore available resources and support systems, Assess for adequacy in community support network, Educate family and significant other(s) on suicide prevention, Complete Psychosocial Assessment, Interpersonal group therapy.  Evaluation of Outcomes: Progressing   Progress in Treatment: Attending groups: Yes. Participating in groups: Yes. Taking medication as prescribed: Yes. Toleration medication: Yes. Family/Significant other contact made: No, will contact:  CSW will obtain consent to reach  collateral.  Patient understands diagnosis: Yes. Discussing patient identified problems/goals with staff: Yes. Medical problems stabilized or resolved: Yes. Denies suicidal/homicidal ideation: No. Issues/concerns per patient self-inventory: Yes. Other: none  New problem(s) identified: No, Describe:  none  New Short Term/Long Term Goal(s): Patient to work towards medication management for mood stabilization; elimination of SI thoughts; development of comprehensive mental wellness plan.  Patient Goals:  Patient states their goal for treatment is to "I don;t know, feel better and more optimistic."   Discharge Plan or Barriers: No psychosocial barriers identified at this time, patient to return to place of residence when appropriate for discharge.   Reason for Continuation of Hospitalization: Depression Suicidal ideation  Estimated Length of Stay: 1-7 days   Last 3 [place] Suicide Severity Risk Score: [place] Admission (Current) from 02/12/2022 in [place] [facility] ED from [facility]11/20[facility] in [facility] ED from 01/12/2021 in [place] High Risk High Risk No Risk       Last PHQ 2/9 Scores:    02/11/2022    5:05 PM 10/17/2019   11:09 AM 11/03/2017    4:42 PM  Depression screen PHQ 2/9  Decreased Interest '1 1 2  '$ Down, Depressed, Hopeless '2 1 2  '$ PHQ - 2 Score '3 2 4  '$ Altered sleeping '2 1 2  '$ Tired, decreased energy '2 1 2  '$ Change in appetite '2 1 2  '$ Feeling bad or failure about yourself  '2 1 2  '$ Trouble concentrating 2 0 2  Moving slowly or fidgety/restless 0 0 2  Suicidal thoughts 2 0 2  PHQ-9 Score '15 6 18  '$ Difficult doing work/chores Somewhat difficult Not difficult at all     Scribe for Treatment Team: Larose Kells 02/13/2022 12:15 PM

## 2022-02-13 NOTE — H&P (Signed)
Psychiatric Admission Assessment Adult  Patient Identification: Renee Steele MRN:  [id number] Date of Evaluation:  02/13/2022 Chief Complaint:  MDD (major depressive disorder), recurrent severe, without psychosis ([place]) [F33.2] Principal Diagnosis: Bipolar I disorder, current or most recent episode manic, severe ([place]) Diagnosis:  Principal Problem:   Bipolar I disorder, current or most recent episode manic, severe ([place]) Active Problems:   UTI (urinary tract infection)   Gambling disorder, persistent  History of Present Illness: Renee Steele is a 41 YO F with a history of bipolar disorder and gambling addiction s/p bariatric surgery who presents with suicidal ideation with plan to jump off a bridge.  On interview, the patient is hyperverbal, but able to be redirected. She explains that she had stopped taking her medications several months ago because she did not believe they were working. She has been under financial strain due to her gambling addiction. She is having relationship conflict with her siblings, who are her primary supports. Her mood has been labile and getting worse. She is not sleeping and has nightmares when she does. She has gained back the weight she lost after bariatric surgery. She denies hallucinations, paranoia, delusions. She has been hypersexual and is concerned that she has contracted something, since she has had dysuria and frequency for 6 weeks. Over the last few days "I feel not loved" and has become suicidal with plan to jump from a bridge. No homicidal ideation.  Associated Signs/Symptoms: Depression Symptoms:  insomnia, difficulty concentrating, hopelessness, suicidal thoughts with specific plan, anxiety, disturbed sleep, Duration of Depression Symptoms: Greater than two weeks  (Hypo) Manic Symptoms:  Elevated Mood, Impulsivity, Irritable Mood, Labiality of Mood, Sexually Inapproprite Behavior, Anxiety Symptoms:  Excessive Worry, Psychotic Symptoms:   NA PTSD  Symptoms: nightmares Total Time spent with patient: 45 minutes  Past Psychiatric History: previously diagnosed with bipolar disoder and gambling disorder Has been inpatient 2 times in 2016 and once in 2018 at [facility]. Currently sees Orlie Dakin at [facility].  Previous suicide attempt x1 via overdose Has been on Latuda, lexapro, vistaril, lamictal, oxcarbazepine, trazodone  Is the patient at risk to self? Yes.    Has the patient been a risk to self in the past 6 months? Yes.    Has the patient been a risk to self within the distant past? Yes.    Is the patient a risk to others? No.  Has the patient been a risk to others in the past 6 months? No.  Has the patient been a risk to others within the distant past? No.   [place] Scale:  [place] Admission (Current) from 02/12/2022 in [facility] ED from 02/11/2022 in [facility] ED from 01/12/2021 in [facility] CATEGORY High Risk High Risk No Risk        Prior Inpatient Therapy:   Prior Outpatient Therapy:    Alcohol Screening: 1. How often do you have a drink containing alcohol?: 2 to 4 times a month 2. How many drinks containing alcohol do you have on a typical day when you are drinking?: 1 or 2 3. How often do you have six or more drinks on one occasion?: Never AUDIT-C Score: 2 Substance Abuse History in the last 12 months:  No. Consequences of Substance Abuse: Negative Previous Psychotropic Medications: Yes  Psychological Evaluations: Yes  Past Medical History:  Past Medical History:  Diagnosis Date   Bacterial vaginosis    Chlamydia  Depression    Diabetes mellitus without complication ([place])    HPV in female    Hypertension    Hypothyroidism    Morbid obesity ([place])    OSA on CPAP    Post traumatic stress disorder (PTSD)    Urinary tract infection     Past Surgical History:  Procedure Laterality Date   MOLE  REMOVAL     TONSILLECTOMY     WISDOM TOOTH EXTRACTION     Family History:  Family History  Problem Relation Age of Onset   Bipolar disorder Father    Bipolar disorder Mother    Cancer Mother    Family Psychiatric  History: father with substance abuse, uncle and cousin with completed suicide Tobacco Screening:   Social History:  Social History   Substance and Sexual Activity  Alcohol Use Yes   Comment: socially      Social History   Substance and Sexual Activity  Drug Use No    Additional Social History:                           Allergies:   Allergies  Allergen Reactions   Lisinopril Shortness Of Breath and Cough   Latex Itching   Lab Results:  Results for orders placed or performed during the hospital encounter of 02/11/22 (from the past 48 hour(s))  Urinalysis, Routine w reflex microscopic Urine, Clean Catch     Status: Abnormal   Collection Time: 02/11/22  7:45 PM  Result Value Ref Range   Color, Urine YELLOW YELLOW   APPearance CLOUDY (A) CLEAR   Specific Gravity, Urine 1.019 1.005 - 1.030   pH 5.0 5.0 - 8.0   Glucose, UA NEGATIVE NEGATIVE mg/dL   Hgb urine dipstick MODERATE (A) NEGATIVE   Bilirubin Urine NEGATIVE NEGATIVE   Ketones, ur NEGATIVE NEGATIVE mg/dL   Protein, ur NEGATIVE NEGATIVE mg/dL   Nitrite NEGATIVE NEGATIVE   Leukocytes,Ua LARGE (A) NEGATIVE   RBC / HPF 0-5 0 - 5 RBC/hpf   WBC, UA 21-50 0 - 5 WBC/hpf   Bacteria, UA FEW (A) NONE SEEN   Squamous Epithelial / LPF 0-5 0 - 5   Mucus PRESENT     Comment: Performed at [facility], [address], [place], [place] [address]  Pregnancy, urine     Status: None   Collection Time: 02/11/22  7:46 PM  Result Value Ref Range   Preg Test, Ur NEGATIVE NEGATIVE    Comment: Performed at [facility] [address], [place], [place] [address]  Resp Panel by RT-PCR (Flu A&B, Covid) Anterior Nasal Swab     Status: None   Collection Time: 02/11/22  7:48 PM   Specimen: Anterior  Nasal Swab  Result Value Ref Range   SARS Coronavirus 2 by RT PCR NEGATIVE NEGATIVE    Comment: (NOTE) SARS-CoV-2 target nucleic acids are NOT DETECTED.  The SARS-CoV-2 RNA is generally detectable in upper respiratory specimens during the acute phase of infection. The lowest concentration of SARS-CoV-2 viral copies this assay can detect is 138 copies/mL. A negative result does not preclude SARS-Cov-2 infection and should not be used as the sole basis for treatment or other patient management decisions. A negative result may occur with  improper specimen collection/handling, submission of specimen other than nasopharyngeal swab, presence of viral mutation(s) within the areas targeted by this assay, and inadequate number of viral copies(<138 copies/mL). A negative result must be combined  with clinical observations, patient history, and epidemiological information. The expected result is Negative.  Fact Sheet for Patients:  EntrepreneurPulse.com.au  Fact Sheet for Healthcare Providers:  IncredibleEmployment.be  This test is no t yet approved or cleared by the [place] FDA and  has been authorized for detection and/or diagnosis of SARS-CoV-2 by FDA under an Emergency Use Authorization (EUA). This EUA will remain  in effect (meaning this test can be used) for the duration of the COVID-19 declaration under Section 564(b)(1) of the Act, 21 U.S.C.section 360bbb-3(b)(1), unless the authorization is terminated  or revoked sooner.       Influenza A by PCR NEGATIVE NEGATIVE   Influenza B by PCR NEGATIVE NEGATIVE    Comment: (NOTE) The Xpert Xpress SARS-CoV-2/FLU/RSV plus assay is intended as an aid in the diagnosis of influenza from Nasopharyngeal swab specimens and should not be used as a sole basis for treatment. Nasal washings and aspirates are unacceptable for Xpert Xpress SARS-CoV-2/FLU/RSV testing.  Fact Sheet for  Patients: EntrepreneurPulse.com.au  Fact Sheet for Healthcare Providers: IncredibleEmployment.be  This test is not yet approved or cleared by the [place] FDA and has been authorized for detection and/or diagnosis of SARS-CoV-2 by FDA under an Emergency Use Authorization (EUA). This EUA will remain in effect (meaning this test can be used) for the duration of the COVID-19 declaration under Section 564(b)(1) of the Act, 21 U.S.C. section 360bbb-3(b)(1), unless the authorization is terminated or revoked.  Performed at [facility], [place] [address] [place], [place] [address]   POCT Urine Drug Screen - (I-Screen)     Status: None (Preliminary result)   Collection Time: 02/11/22  7:53 PM  Result Value Ref Range   POC Amphetamine UR None Detected NONE DETECTED (Cut Off Level 1000 ng/mL)   POC Secobarbital (BAR) None Detected NONE DETECTED (Cut Off Level 300 ng/mL)   POC Buprenorphine (BUP) None Detected NONE DETECTED (Cut Off Level 10 ng/mL)   POC Oxazepam (BZO) None Detected NONE DETECTED (Cut Off Level 300 ng/mL)   POC Cocaine UR None Detected NONE DETECTED (Cut Off Level 300 ng/mL)   POC Methamphetamine UR None Detected NONE DETECTED (Cut Off Level 1000 ng/mL)   POC Morphine None Detected NONE DETECTED (Cut Off Level 300 ng/mL)   POC Methadone UR None Detected NONE DETECTED (Cut Off Level 300 ng/mL)   POC Oxycodone UR None Detected NONE DETECTED (Cut Off Level 100 ng/mL)   POC Marijuana UR None Detected NONE DETECTED (Cut Off Level 50 ng/mL)  CBC with Differential/Platelet     Status: Abnormal   Collection Time: 02/11/22  7:54 PM  Result Value Ref Range   WBC 8.7 4.0 - 10.5 K/uL   RBC 5.11 3.87 - 5.11 MIL/uL   Hemoglobin 13.7 12.0 - 15.0 g/dL   HCT 42.8 36.0 - 46.0 %   MCV 83.8 80.0 - 100.0 fL   MCH 26.8 26.0 - 34.0 pg   MCHC 32.0 30.0 - 36.0 g/dL   RDW 12.2 11.5 - 15.5 %   Platelets 401 (H) 150 - 400 K/uL   nRBC 0.0 0.0 - 0.2 %    Neutrophils Relative % 54 %   Neutro Abs 4.9 1.7 - 7.7 K/uL   Lymphocytes Relative 33 %   Lymphs Abs 2.9 0.7 - 4.0 K/uL   Monocytes Relative 10 %   Monocytes Absolute 0.9 0.1 - 1.0 K/uL   Eosinophils Relative 1 %   Eosinophils Absolute 0.0 0.0 - 0.5 K/uL   Basophils  Relative 1 %   Basophils Absolute 0.1 0.0 - 0.1 K/uL   Immature Granulocytes 1 %   Abs Immature Granulocytes 0.04 0.00 - 0.07 K/uL    Comment: Performed at [facility], [place] [address] [place], [place] [address]  Comprehensive metabolic panel     Status: Abnormal   Collection Time: 02/11/22  7:54 PM  Result Value Ref Range   Sodium 141 135 - 145 mmol/L   Potassium 4.0 3.5 - 5.1 mmol/L   Chloride 110 98 - 111 mmol/L   CO2 19 (L) 22 - 32 mmol/L   Glucose, Bld 114 (H) 70 - 99 mg/dL    Comment: Glucose reference range applies only to samples taken after fasting for at least 8 hours.   BUN 11 6 - 20 mg/dL   Creatinine, Ser 0.69 0.44 - 1.00 mg/dL   Calcium 9.2 8.9 - 10.3 mg/dL   Total Protein 7.1 6.5 - 8.1 g/dL   Albumin 3.9 3.5 - 5.0 g/dL   AST 18 15 - 41 U/L   ALT 13 0 - 44 U/L   Alkaline Phosphatase 68 38 - 126 U/L   Total Bilirubin 0.7 0.3 - 1.2 mg/dL   GFR, Estimated >60 >60 mL/min    Comment: (NOTE) Calculated using the CKD-EPI Creatinine Equation (2021)    Anion gap 12 5 - 15    Comment: Performed at [facility] [address] [place], [place] [address]  Hemoglobin A1c     Status: None   Collection Time: 02/11/22  7:54 PM  Result Value Ref Range   Hgb A1c MFr Bld 4.9 4.8 - 5.6 %    Comment: (NOTE) Pre diabetes:          5.7%-6.4%  Diabetes:              >6.4%  Glycemic control for   <7.0% adults with diabetes    Mean Plasma Glucose 93.93 mg/dL    Comment: Performed at [facility] [address] [place], [place] [address]  Magnesium     Status: None   Collection Time: 02/11/22  7:54 PM  Result Value Ref Range   Magnesium 2.0 1.7 - 2.4 mg/dL    Comment: Performed at [facility], [place] [address], [place], [place] [address]  Ethanol     Status: None   Collection Time: 02/11/22  7:54 PM  Result Value Ref Range   Alcohol, Ethyl (B) <10 <10 mg/dL    Comment: (NOTE) Lowest detectable limit for serum alcohol is 10 mg/dL.  For medical purposes only. Performed at [facility], [place] [address] [place],  [address]   Lipid panel     Status: Abnormal   Collection Time: 02/11/22  7:54 PM  Result Value Ref Range   Cholesterol 194 0 - 200 mg/dL   Triglycerides 183 (H) <150 mg/dL   HDL 52 >40 mg/dL   Total CHOL/HDL Ratio 3.7 RATIO   VLDL 37 0 - 40 mg/dL   LDL Cholesterol 105 (H) 0 - 99 mg/dL    Comment:        Total Cholesterol/HDL:CHD Risk Coronary Heart Disease Risk Table                     Men   Women  1/2 Average Risk   3.4   3.3  Average Risk       5.0   4.4  2 X Average Risk  9.6   7.1  3 X Average Risk  23.4   11.0        Use the calculated Patient Ratio above and the CHD Risk Table to determine the patient's CHD Risk.        ATP III CLASSIFICATION (LDL):  <100     mg/dL   Optimal  100-129  mg/dL   Near or Above                    Optimal  130-159  mg/dL   Borderline  160-189  mg/dL   High  >190     mg/dL   Very High Performed at [facility] [address], [place], [place] [address]   TSH     Status: None   Collection Time: 02/11/22  7:54 PM  Result Value Ref Range   TSH 1.429 0.350 - 4.500 uIU/mL    Comment: Performed by a 3rd Generation assay with a functional sensitivity of <=0.01 uIU/mL. Performed at [facility], [place] [address] [place], [place] [address]   Hepatitis panel, acute     Status: None   Collection Time: 02/11/22  7:54 PM  Result Value Ref Range   Hepatitis B Surface Ag NON REACTIVE NON REACTIVE   HCV Ab NON REACTIVE NON REACTIVE    Comment: (NOTE) Nonreactive HCV antibody screen is consistent with no HCV infections,  unless recent infection is suspected or other evidence exists to indicate  HCV infection.     Hep A IgM NON REACTIVE NON REACTIVE   Hep B C IgM NON REACTIVE NON REACTIVE    Comment: Performed at [facility], [place] [address], [place], [place] [address]  RPR     Status: None   Collection Time: 02/11/22  7:54 PM  Result Value Ref Range   RPR Ser Ql NON REACTIVE NON REACTIVE    Comment: Performed at [facility], [place] [address] [place], [place] [address]  POC SARS Coronavirus 2 Ag     Status: None   Collection Time: 02/11/22  8:16 PM  Result Value Ref Range   SARSCOV2ONAVIRUS 2 AG NEGATIVE NEGATIVE    Comment: (NOTE) SARS-CoV-2 antigen NOT DETECTED.   Negative results are presumptive.  Negative results do not preclude SARS-CoV-2 infection and should not be used as the sole basis for treatment or other patient management decisions, including infection  control decisions, particularly in the presence of clinical signs and  symptoms consistent with COVID-19, or in those who have been in contact with the virus.  Negative results must be combined with clinical observations, patient history, and epidemiological information. The expected result is Negative.  Fact Sheet for Patients: HandmadeRecipes.com.cy  Fact Sheet for Healthcare Providers: FuneralLife.at  This test is not yet approved or cleared by the [place] FDA and  has been authorized for detection and/or diagnosis of SARS-CoV-2 by FDA under an Emergency Use Authorization (EUA).  This EUA will remain in effect (meaning this test can be used) for the duration of  the COV ID-19 declaration under Section 564(b)(1) of the Act, 21 U.S.C. section 360bbb-3(b)(1), unless the authorization is terminated or revoked sooner.    Glucose, capillary     Status: Abnormal   Collection Time: 02/12/22  7:19 AM  Result Value Ref Range   Glucose-Capillary 101 (H) 70 - 99 mg/dL    Comment: Glucose reference range applies only to samples taken after fasting  for at least 8  hours.    Blood Alcohol level:  Lab Results  Component Value Date   ETH <10 02/11/2022   ETH <5 38/04/1750    Metabolic Disorder Labs:  Lab Results  Component Value Date   HGBA1C 4.9 02/11/2022   MPG 93.93 02/11/2022   MPG 100 10/12/2016   Lab Results  Component Value Date   PROLACTIN 6.4 10/12/2016   Lab Results  Component Value Date   CHOL 194 02/11/2022   TRIG 183 (H) 02/11/2022   HDL 52 02/11/2022   CHOLHDL 3.7 02/11/2022   VLDL 37 02/11/2022   LDLCALC 105 (H) 02/11/2022   LDLCALC 81 08/06/2017    Current Medications: Current Facility-Administered Medications  Medication Dose Route Frequency Provider Last Rate Last Admin   acetaminophen (TYLENOL) tablet 650 mg  650 mg Oral Q6H PRN Lucky Rathke, FNP   650 mg at 02/13/22 0638   alum & mag hydroxide-simeth (MAALOX/MYLANTA) 200-200-20 MG/5ML suspension 30 mL  30 mL Oral Q4H PRN Lucky Rathke, FNP       amLODipine (NORVASC) tablet 10 mg  10 mg Oral Daily Lucky Rathke, FNP   10 mg at 02/13/22 0906   atorvastatin (LIPITOR) tablet 20 mg  20 mg Oral Daily Lucky Rathke, FNP   20 mg at 02/13/22 0258   cariprazine (VRAYLAR) capsule 1.5 mg  1.5 mg Oral Daily Lucky Rathke, FNP   1.5 mg at 02/13/22 5277   hydrOXYzine (ATARAX) tablet 25 mg  25 mg Oral TID PRN Lucky Rathke, FNP   25 mg at 02/12/22 2124   levothyroxine (SYNTHROID) tablet 50 mcg  50 mcg Oral Daily Lucky Rathke, FNP   50 mcg at 02/13/22 8242   LORazepam (ATIVAN) tablet 0.5 mg  0.5 mg Oral BID Maida Sale, MD       losartan (COZAAR) tablet 25 mg  25 mg Oral Daily Lucky Rathke, FNP   25 mg at 02/13/22 0906   magnesium hydroxide (MILK OF MAGNESIA) suspension 30 mL  30 mL Oral Daily PRN Lucky Rathke, FNP       melatonin tablet 3 mg  3 mg Oral QHS Lucky Rathke, FNP   3 mg at 02/12/22 2124   multivitamin with minerals tablet 1 tablet  1 tablet Oral Daily Lucky Rathke, FNP   1 tablet at 02/13/22 3536   nitrofurantoin  (macrocrystal-monohydrate) (MACROBID) capsule 100 mg  100 mg Oral Q12H Callen Zuba, Jackie Plum, MD   100 mg at 02/13/22 1113   topiramate (TOPAMAX) tablet 25 mg  25 mg Oral BID Maida Sale, MD       PTA Medications: Medications Prior to Admission  Medication Sig Dispense Refill Last Dose   amLODipine (NORVASC) 10 MG tablet Take 1 tablet (10 mg total) by mouth daily. MUST MAKE APPT FOR FURTHER REFILLS 30 tablet 0    atorvastatin (LIPITOR) 20 MG tablet Take 1 tablet (20 mg total) by mouth daily. 30 tablet 3    cetirizine (ZYRTEC) 10 MG tablet Take 10 mg by mouth daily.      Cholecalciferol (VITAMIN D-3 PO) Take 1 tablet by mouth daily.      fluticasone (FLONASE) 50 MCG/ACT nasal spray Place 1 spray into both nostrils daily as needed for allergies.      levothyroxine (SYNTHROID, LEVOTHROID) 50 MCG tablet Take 1 tablet (50 mcg total) by mouth daily. 30 tablet 5    losartan (COZAAR) 25 MG tablet Take 25 mg by mouth daily.  MELATONIN GUMMIES PO Take 2 tablets by mouth at bedtime.      Multiple Vitamin (MULTIVITAMIN WITH MINERALS) TABS tablet Take 1 tablet by mouth daily.      OVER THE COUNTER MEDICATION Take 1 tablet by mouth daily. Biotin Soft Chews       Musculoskeletal: Strength & Muscle Tone: within normal limits Gait & Station: normal Patient leans: N/A            Psychiatric Specialty Exam:  Presentation  General Appearance: Disheveled  Eye Contact:Fair  Speech:Pressured  Speech Volume:Normal  Handedness:Right   Mood and Affect  Mood:Labile; Irritable  Affect:Labile   Thought Process  Thought Processes:-- (circumstantial)  Duration of Psychotic Symptoms: No data recorded Past Diagnosis of Schizophrenia or Psychoactive disorder: No  Descriptions of Associations:Intact  Orientation:Full (Time, Place and Person)  Thought Content:Logical  Hallucinations:Hallucinations: None  Ideas of Reference:None  Suicidal Thoughts:Suicidal Thoughts: Yes,  Active SI Active Intent and/or Plan: Without Intent; Without Means to Carry Out  Homicidal Thoughts:Homicidal Thoughts: No   Sensorium  Memory:Immediate Fair; Recent Fair; Remote Fair  Judgment:Poor  Insight:Poor   Executive Functions  Concentration:Poor  Attention Span:Poor  [place]   Psychomotor Activity  Psychomotor Activity:Psychomotor Activity: Restlessness   Assets  Assets:Communication Skills   Sleep  Sleep:Sleep: Poor    Physical Exam: Physical Exam ROS Blood pressure (!) 144/73, pulse (!) 134, temperature (!) 97.5 F (36.4 C), temperature source Oral, resp. rate 18, height '5\' 8"'$  (1.727 m), weight (!) 178.3 kg, SpO2 99 %. Body mass index is 59.76 kg/m.  Treatment Plan Summary: Daily contact with patient to assess and evaluate symptoms and progress in treatment and Medication management  Observation Level/Precautions:  15 minute checks  Laboratory: G/C, HIV, nutrition  Psychotherapy:    Medications:  Continue Vraylar. Start Topamax for impulsivity and weight loss. Ativan 0.11m PO BID x2 days for mixed mania  Macrobid '100mg'$  PO BID for UTI  Consultations:    Discharge Concerns:    Estimated LOS:  Other:     Physician Treatment Plan for Primary Diagnosis: Bipolar I disorder, current or most recent episode manic, severe ([place] Long Term Goal(s): Improvement in symptoms so as ready for discharge  Short Term Goals: Ability to identify changes in lifestyle to reduce recurrence of condition will improve, Ability to verbalize feelings will improve, Ability to disclose and discuss suicidal ideas, Ability to demonstrate self-control will improve, Ability to identify and develop effective coping behaviors will improve, Ability to maintain clinical measurements within normal limits will improve, and Compliance with prescribed medications will improve  Physician Treatment Plan for Secondary Diagnosis: Principal  Problem:   Bipolar I disorder, current or most recent episode manic, severe ([place] Active Problems:   UTI (urinary tract infection)   Gambling disorder, persistent  Long Term Goal(s): Improvement in symptoms so as ready for discharge  Short Term Goals: Ability to identify changes in lifestyle to reduce recurrence of condition will improve, Ability to verbalize feelings will improve, Ability to disclose and discuss suicidal ideas, Ability to demonstrate self-control will improve, Ability to identify and develop effective coping behaviors will improve, Ability to maintain clinical measurements within normal limits will improve, and Compliance with prescribed medications will improve  I certify that inpatient services furnished can reasonably be expected to improve the patient's condition.    SMaida Sale MD 8/11/202312:03 PM

## 2022-02-13 NOTE — Progress Notes (Signed)
   02/13/22 2000  Psych Admission Type (Psych Patients Only)  Admission Status Voluntary  Psychosocial Assessment  Patient Complaints Anxiety  Eye Contact Fair  Facial Expression Animated  Affect Anxious  Speech Logical/coherent  Interaction Childlike;Attention-seeking  Motor Activity Slow  Appearance/Hygiene In hospital gown  Behavior Characteristics Cooperative  Mood Anxious  Aggressive Behavior  Effect No apparent injury  Thought Process  Coherency WDL  Content WDL  Delusions WDL  Perception WDL  Hallucination None reported or observed  Judgment WDL  Confusion WDL  Danger to Self  Current suicidal ideation? Denies  Danger to Others  Danger to Others None reported or observed
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
## 2022-02-13 NOTE — [facility] Group Notes (Signed)
Adult Psychoeducational Group Note  Date:  02/13/2022 Time:  2:37 PM  Group Topic/Focus:  Emotional Education:   The focus of this group is to discuss what feelings/emotions are, and how they are experienced.  Participation Level:  Active  Participation Quality:  Attentive  Affect:  Appropriate  Cognitive:  Alert  Insight: Appropriate  Engagement in Group:  Engaged  Modes of Intervention:  Activity  Additional Comments:  Patient attended and participated in the emotional regulation group activity.  Annie Sable 02/13/2022, 2:37 PM

## 2022-02-14 LAB — VITAMIN D 25 HYDROXY (VIT D DEFICIENCY, FRACTURES): Vit D, 25-Hydroxy: 36.27 ng/mL (ref 30–100)

## 2022-02-14 LAB — RAPID HIV SCREEN (HIV 1/2 AB+AG)
HIV 1/2 Antibodies: NONREACTIVE
HIV-1 P24 Antigen - HIV24: NONREACTIVE

## 2022-02-14 LAB — VITAMIN B12: Vitamin B-12: 634 pg/mL (ref 180–914)

## 2022-02-14 NOTE — [facility] Group Notes (Signed)
Goals Group 78/2023   Group Focus: affirmation, clarity of thought, and goals/reality orientation Treatment Modality:  Psychoeducation Interventions utilized were assignment, group exercise, and support Purpose: To be able to understand and verbalize the reason for their admission to the hospital. To understand that the medication helps with their chemical imbalance but they also need to work on their choices in life. To be challenged to develop a list of 30 positives about themselves. Also introduce the concept that "feelings" are not reality.  Participation Level:  Active  Participation Quality:  Appropriate  Affect:  Appropriate  Cognitive:  Appropriate  Insight:  Improving  Engagement in Group:  Engaged  Additional Comments:  Pt rates their energy at a 1/10. Participated in the group.  Paulino Rily

## 2022-02-14 NOTE — Group Note (Signed)
LCSW Group Therapy Note No social work group was held today due to newly separated halls necessitating a higher number of groups, a significant number of expected and unexpected discharges, a number of necessary Suicide Prevention Education calls to family members, and a high number of admissions that required initial psychosocial assessments.  The following was provided to each patient on 300 and 500 halls in lieu of in-person group:  Healthy vs. 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

## 2022-02-14 NOTE — Progress Notes (Signed)
Adult Psychoeducational Group Note  Date:  02/14/2022 Time:  8:51 PM  Group Topic/Focus:  Wrap-Up Group:   The focus of this group is to help patients review their daily goal of treatment and discuss progress on daily workbooks.  Participation Level:  Active  Participation Quality:  Appropriate  Affect:  Appropriate  Cognitive:  Appropriate  Insight: Appropriate  Engagement in Group:  Engaged  Modes of Intervention:  Education and Exploration  Additional Comments:  Patient attended and participated in group tonight. She reports that she should not let others define you.  Salley Scarlet [facility] 02/14/2022, 8:51 PM

## 2022-02-14 NOTE — Progress Notes (Signed)
[facility] MD Progress Note  02/14/2022 4:44 PM MESA JANUS  MRN:  [id number]  Subjective: Verlin Fester, states, "I feel hopeless because of my many problems as eviction from my apartment, I do not manage my money effectively, I am about to lose my car and other problems. I hope I get some help as coping skill before I leave here."  Brief History: Alexie is a 41 YO F with a history of bipolar disorder and gambling addiction s/p bariatric surgery who presents with suicidal ideation with plan to jump off a bridge.   Yesterday's psychiatric team recommendation: Continue Vraylar for depression Continue Topamax for impulsivity and weight loss Continue Ativan 0.5 mg p.o. twice daily x 2 days for mixed mania  Per MAR review, as needed for last 24 hours include: Tylenol, hydroxyzine.  On assessment today, patient was seen face-to-face and interviewed in her room.  Chart reviewed and findings shared with the treatment team and consult with Dr. Winfred Leeds.  The patient is oriented x 4, to person, place, time, and situation.  Patient is pleasant and hyperverbal. She reports that she has been under financial strain due to her gambling addiction.  She reports inability for her to manage her resources. Her mood is anxious and depressed.  Affect is congruent and appropriate. Reports sleeping for only 4 hours due to having nightmares. She has gained back the weight she lost after bariatric surgery. She denies hallucinations, paranoia, delusions. She has been hypersexual and is concerned that she has contracted some infection, since she has had dysuria and frequency for 6 weeks.  Recently started on nitrofurantoin 100 mg p.o. twice daily for UTI.  Continues on prescribed medication without any adverse reactions.  Principal Problem: Bipolar I disorder, current or most recent episode manic, severe ([place])  Diagnosis: Principal Problem:   Bipolar I disorder, current or most recent episode manic, severe ([place]) Active  Problems:   UTI (urinary tract infection)   Gambling disorder, persistent  Total Time spent with patient: 45 minutes  Past Psychiatric History:  previously diagnosed with bipolar disoder and gambling disorder Has been inpatient 2 times in 2016 and once in 2018 at [facility]. Currently sees Orlie Dakin at [facility].  Previous suicide attempt x1 via overdose Has been on Latuda, lexapro, vistaril, lamictal, oxcarbazepine, trazodone    Past Medical History:  Past Medical History:  Diagnosis Date   Bacterial vaginosis    Chlamydia    Depression    Diabetes mellitus without complication ([place])    HPV in female    Hypertension    Hypothyroidism    Morbid obesity ([place])    OSA on CPAP    Post traumatic stress disorder (PTSD)    Urinary tract infection     Past Surgical History:  Procedure Laterality Date   MOLE REMOVAL     TONSILLECTOMY     WISDOM TOOTH EXTRACTION     Family History:  Family History  Problem Relation Age of Onset   Bipolar disorder Father    Bipolar disorder Mother    Cancer Mother    Family Psychiatric  History: father with substance abuse, uncle and cousin with completed suicide  Social History:  Social History   Substance and Sexual Activity  Alcohol Use Yes   Comment: socially      Social History   Substance and Sexual Activity  Drug Use No    Social History   Socioeconomic History   Marital status: Single    Spouse name: Not on file  Number of children: Not on file   Years of education: Not on file   Highest education level: Not on file  Occupational History   Not on file  Tobacco Use   Smoking status: Never   Smokeless tobacco: Never  Vaping Use   Vaping Use: Never used  Substance and Sexual Activity   Alcohol use: Yes    Comment: socially    Drug use: No   Sexual activity: Not Currently    Birth control/protection: Condom  Other Topics Concern   Not on file  Social History Narrative   Not on file   Social Determinants of Health    Financial Resource Strain: Not on file  Food Insecurity: Not on file  Transportation Needs: Not on file  Physical Activity: Not on file  Stress: Not on file  Social Connections: Not on file   Additional Social History:    Sleep: Good  Appetite:  Good  Current Medications: Current Facility-Administered Medications  Medication Dose Route Frequency Provider Last Rate Last Admin   acetaminophen (TYLENOL) tablet 650 mg  650 mg Oral Q6H PRN Lucky Rathke, FNP   650 mg at 02/13/22 0638   alum & mag hydroxide-simeth (MAALOX/MYLANTA) 200-200-20 MG/5ML suspension 30 mL  30 mL Oral Q4H PRN Lucky Rathke, FNP       amLODipine (NORVASC) tablet 10 mg  10 mg Oral Daily Lucky Rathke, FNP   10 mg at 02/14/22 0805   atorvastatin (LIPITOR) tablet 20 mg  20 mg Oral Daily Lucky Rathke, FNP   20 mg at 02/14/22 0805   cariprazine (VRAYLAR) capsule 1.5 mg  1.5 mg Oral Daily Lucky Rathke, FNP   1.5 mg at 02/14/22 0805   hydrOXYzine (ATARAX) tablet 25 mg  25 mg Oral TID PRN Lucky Rathke, FNP   25 mg at 02/13/22 2124   levothyroxine (SYNTHROID) tablet 50 mcg  50 mcg Oral Daily Lucky Rathke, FNP   50 mcg at 02/14/22 0805   LORazepam (ATIVAN) tablet 0.5 mg  0.5 mg Oral BID Maida Sale, MD   0.5 mg at 02/14/22 1606   losartan (COZAAR) tablet 25 mg  25 mg Oral Daily Lucky Rathke, FNP   25 mg at 02/14/22 0805   magnesium hydroxide (MILK OF MAGNESIA) suspension 30 mL  30 mL Oral Daily PRN Lucky Rathke, FNP       melatonin tablet 3 mg  3 mg Oral QHS Lucky Rathke, FNP   3 mg at 02/13/22 2124   multivitamin with minerals tablet 1 tablet  1 tablet Oral Daily Lucky Rathke, FNP   1 tablet at 02/14/22 0804   nitrofurantoin (macrocrystal-monohydrate) (MACROBID) capsule 100 mg  100 mg Oral Q12H Hill, Jackie Plum, MD   100 mg at 02/14/22 0805   topiramate (TOPAMAX) tablet 25 mg  25 mg Oral BID Maida Sale, MD   25 mg at 02/14/22 1607    Lab Results:  Results for orders placed or performed  during the hospital encounter of 02/12/22 (from the past 48 hour(s))  Vitamin B12     Status: None   Collection Time: 02/14/22  6:43 AM  Result Value Ref Range   Vitamin B-12 634 180 - 914 pg/mL    Comment: (NOTE) This assay is not validated for testing neonatal or myeloproliferative syndrome specimens for Vitamin B12 levels. Performed at [facility], [place] [address] [place], [place] [address]   VITAMIN D 25  Hydroxy (Vit-D Deficiency, Fractures)     Status: None   Collection Time: 02/14/22  6:43 AM  Result Value Ref Range   Vit D, 25-Hydroxy 36.27 30 - 100 ng/mL    Comment: (NOTE) Vitamin D deficiency has been defined by the [place] practice guideline as a level of serum 25-OH  vitamin D less than 20 ng/mL (1,2). The Endocrine Society went on to  further define vitamin D insufficiency as a level between 21 and 29  ng/mL (2).  1. IOM (Institute of Medicine). 2010. Dietary reference intakes for  calcium and D. [place]: The [employer]. 2. Holick MF, [place], Bischoff-Ferrari HA, et al. Evaluation,  treatment, and prevention of vitamin D deficiency: an Endocrine  Society clinical practice guideline, JCEM. 2011 Jul; 96(7): 1911-30.  Performed at [facility], [place] [address], [place], [place] [address]   Rapid HIV screen (HIV 1/2 Ab+Ag)     Status: None   Collection Time: 02/14/22  6:43 AM  Result Value Ref Range   HIV-1 P24 Antigen - HIV24 NON REACTIVE NON REACTIVE    Comment: (NOTE) Detection of p24 may be inhibited by biotin in the sample, causing false negative results in acute infection.    HIV 1/2 Antibodies NON REACTIVE NON REACTIVE   Interpretation (HIV Ag Ab)      A non reactive test result means that HIV 1 or HIV 2 antibodies and HIV 1 p24 antigen were not detected in the specimen.    Comment: CALLED STRADER,W ON 02/14/22 AT 0951 BY LUZOLOP Performed at [facility], [place] [address] [place], [place] [address]     Blood Alcohol level:  Lab Results  Component Value Date   ETH <10 02/11/2022   ETH <5 62/70/3500    Metabolic Disorder Labs: Lab Results  Component Value Date   HGBA1C 4.9 02/11/2022   MPG 93.93 02/11/2022   MPG 100 10/12/2016   Lab Results  Component Value Date   PROLACTIN 8.8 02/11/2022   PROLACTIN 6.4 10/12/2016   Lab Results  Component Value Date   CHOL 194 02/11/2022   TRIG 183 (H) 02/11/2022   HDL 52 02/11/2022   CHOLHDL 3.7 02/11/2022   VLDL 37 02/11/2022   LDLCALC 105 (H) 02/11/2022   LDLCALC 81 08/06/2017    Physical Findings: AIMS:  , ,  ,  ,    CIWA:    COWS:     Musculoskeletal: Strength & Muscle Tone: within normal limits Gait & Station: normal Patient leans: N/A  Psychiatric Specialty Exam:  Presentation  General Appearance: Appropriate for Environment; Casual; Fairly Groomed  Eye Contact:Good  Speech:Clear and Coherent; Normal Rate  Speech Volume:Normal  Handedness:Right   Mood and Affect  Mood:Anxious; Depressed  Affect:Appropriate; Congruent   Thought Process  Thought Processes:Coherent; Linear  Descriptions of Associations:Intact  Orientation:Full (Time, Place and Person)  Thought Content:Logical  History of Schizophrenia/Schizoaffective disorder:No  Duration of Psychotic Symptoms:No data recorded Hallucinations:Hallucinations: None  Ideas of Reference:None  Suicidal Thoughts:Suicidal Thoughts: No SI Active Intent and/or Plan: -- (Denies)  Homicidal Thoughts:Homicidal Thoughts: No   Sensorium  Memory:Immediate Fair; Recent Fair; Remote Fair  Judgment:Fair  Insight:Fair   Executive Functions  Concentration:Fair  Attention Span:Fair  [place]  Language:Good   Psychomotor Activity  Psychomotor Activity:Psychomotor Activity: Normal   Assets  Assets:Communication Skills; Physical Health; Desire for  Improvement   Sleep  Sleep:Sleep: Good Number of Hours  of Sleep: 9  Physical Exam: Physical Exam Vitals and nursing note reviewed.  Constitutional:      Appearance: Normal appearance. She is obese.  HENT:     Head: Normocephalic and atraumatic.     Right Ear: External ear normal.     Left Ear: External ear normal.     Nose: Nose normal.     Mouth/Throat:     Mouth: Mucous membranes are moist.     Pharynx: Oropharynx is clear.  Eyes:     Extraocular Movements: Extraocular movements intact.     Conjunctiva/sclera: Conjunctivae normal.     Pupils: Pupils are equal, round, and reactive to light.  Cardiovascular:     Rate and Rhythm: Normal rate.     Pulses: Normal pulses.     Comments: BP 121/59 Pulmonary:     Effort: Pulmonary effort is normal.  Abdominal:     Palpations: Abdomen is soft.  Genitourinary:    Comments: deferred Musculoskeletal:        General: Normal range of motion.     Cervical back: Normal range of motion and neck supple.  Skin:    General: Skin is warm.  Neurological:     General: No focal deficit present.     Mental Status: She is alert and oriented to person, place, and time.  Psychiatric:        Mood and Affect: Mood normal.        Behavior: Behavior normal.    Review of Systems  Constitutional: Negative.  Negative for chills and fever.  HENT: Negative.  Negative for hearing loss and tinnitus.   Eyes: Negative.  Negative for blurred vision and double vision.  Respiratory: Negative.  Negative for cough, sputum production, shortness of breath and wheezing.   Cardiovascular: Negative.  Negative for chest pain and palpitations.  Gastrointestinal: Negative.  Negative for heartburn, nausea and vomiting.  Genitourinary: Negative.  Negative for dysuria, frequency and urgency.  Musculoskeletal: Negative.  Negative for myalgias and neck pain.  Skin: Negative.  Negative for rash.  Neurological: Negative.  Negative for dizziness and headaches.   Endo/Heme/Allergies: Negative.        Lisinopril Lisinopril  Shortness Of Breath, Cough High Allergy 08/09/2016 Deletion Reason:  Latex Latex  Itching Not Specified  12/21/2016    Psychiatric/Behavioral:  Positive for depression and suicidal ideas. The patient is nervous/anxious.    Blood pressure (!) 121/59, pulse 80, temperature 98.5 F (36.9 C), temperature source Oral, resp. rate 18, height '5\' 8"'$  (1.727 m), weight (!) 178.3 kg, SpO2 100 %. Body mass index is 59.76 kg/m.   Treatment Plan Summary: Daily contact with patient to assess and evaluate symptoms and progress in treatment and Medication management  Treatment Plan Summary: Daily contact with patient to assess and evaluate symptoms and progress in treatment and Medication management   Observation Level/Precautions:  15 minute checks  Laboratory: G/C, HIV, nutrition  Psychotherapy:    Medications:  Continue Vraylar. Start Topamax for impulsivity and weight loss. Ativan 0.3m PO BID x2 days for mixed mania  Macrobid '100mg'$  PO BID for UTI  Consultations:  Pending  Discharge Concerns:  Safety  Estimated LOS:5 to 7 days  Other:      Physician Treatment Plan for Primary Diagnosis: Bipolar I disorder, current or most recent episode manic, severe ([place] Long Term Goal(s): Improvement in symptoms so as ready for discharge   Short Term Goals: Ability to identify changes in lifestyle to reduce recurrence of condition will improve, Ability  to verbalize feelings will improve, Ability to disclose and discuss suicidal ideas, Ability to demonstrate self-control will improve, Ability to identify and develop effective coping behaviors will improve, Ability to maintain clinical measurements within normal limits will improve, and Compliance with prescribed medications will improve   Physician Treatment Plan for Secondary Diagnosis: Principal Problem:   Bipolar I disorder, current or most recent episode manic, severe ([place]) Active Problems:    UTI (urinary tract infection)   Gambling disorder, persistent   Long Term Goal(s): Improvement in symptoms so as ready for discharge   Short Term Goals: Ability to identify changes in lifestyle to reduce recurrence of condition will improve, Ability to verbalize feelings will improve, Ability to disclose and discuss suicidal ideas, Ability to demonstrate self-control will improve, Ability to identify and develop effective coping behaviors will improve, Ability to maintain clinical measurements within normal limits will improve, and Compliance with prescribed medications will improve   I certify that inpatient services furnished can reasonably be expected to improve the patient's condition.    Laretta Bolster, FNP 02/14/2022, 4:44 PM

## 2022-02-14 NOTE — [facility] Group Notes (Signed)
Psychoeducational Group Note    Date:  02/14/2022 Time: 1300-1400    Purpose of Group: . The group focus' on teaching patients on how to identify their needs and their Life Skills:  A group where two lists are made. What people need and what are things that we do that are unhealthy. The lists are developed by the patients and it is explained that we often do the actions that are not healthy to get our list of needs met.  Goal:: to develop the coping skills needed to get their needs met  Participation Level:  Active  Participation Quality:  Appropriate  Affect:  Appropriate  Cognitive:  Oriented  Insight: Improving  Engagement in Group:  Engaged  Modes of Intervention:  Activity, Discussion, Education, and Support  Additional Comments:  Rates energy at a 1/10, Participated fully int he group.  Paulino Rily

## 2022-02-14 NOTE — Group Note (Signed)
Date:  02/14/2022 Time:  9:28 AM  Group Topic/Focus:  Goals Group:   The focus of this group is to help patients establish daily goals to achieve during treatment and discuss how the patient can incorporate goal setting into their daily lives to aide in recovery. Orientation:   The focus of this group is to educate the patient on the purpose and policies of crisis stabilization and provide a format to answer questions about their admission.  The group details unit policies and expectations of patients while admitted.    Participation Level:  Did Not Attend  Participation Quality:    Affect:    Cognitive:    Insight:   Engagement in Group:    Modes of Intervention:    Additional Comments:    Garvin Fila 02/14/2022, 9:28 AM

## 2022-02-14 NOTE — Progress Notes (Addendum)
Patient presents with depressed mood, affect blunted. Renee Steele reports she feels ''tired groggy, foggy'' She relates this mostly to not having her CPAP. Asked patient if she could have family provide a CPAP here and she states she does not have anyone who can bring it. Patient reports feeling tired. Pt reports she is eating and drinking well. She reports she continues to feel depressed, suicidal ideation, but is able to contract for safety. She completed her self inventory form and indicated th same, that her mood with anxiety and depression are at a 8/10 on scale, 10 being worst 0 being none. She also rates hopelessness at a 9/10, with 10 being worst on scale, 0 being none. She denies any auditory or visual hallucinations. Pt compliant with am medications. Above discussed with treatment team. Pt is safe, will con't to monitor.

## 2022-02-15 LAB — URINE CULTURE

## 2022-02-15 LAB — GLUCOSE, CAPILLARY: Glucose-Capillary: 159 mg/dL — ABNORMAL HIGH (ref 70–99)

## 2022-02-15 MED ORDER — CARIPRAZINE HCL 3 MG PO CAPS
3.0000 mg | ORAL_CAPSULE | Freq: Every day | ORAL | Status: DC
  Administered 2022-02-16 – 2022-02-18 (×3): 3 mg via ORAL
  Filled 2022-02-15 (×4): qty 1

## 2022-02-15 NOTE — [facility] Group Notes (Signed)
Adult Psychoeducational Group Note Date:  02/15/2022 Time:  0900-1045 Group Topic/Focus: PROGRESSIVE RELAXATION. A group where deep breathing is taught and tensing and relaxation muscle groups is used. Imagery is used as well.  Pts are asked to imagine 3 pillars that hold them up when they are not able to hold themselves up and to share that with the group.  Participation Level:  Active  Participation Quality:  Appropriate  Affect:  Appropriate  Cognitive:  Oriented  Insight: Improving  Engagement in Group:  Engaged  Modes of Intervention:  Activity, Discussion, Education, and Support  Additional Comments:  Rates her energy at a 1/10. States faith and friends hold her up.  Paulino Rily

## 2022-02-15 NOTE — Progress Notes (Signed)
[facility] MD Progress Note  02/15/2022 1:32 PM ADAMARYS SHALL  MRN:  [id number]  Subjective: Verlin Fester, states, "I am struggling with sadness and being tired all the time."  Brief History: Kinzi is a 41 YO F with a history of bipolar disorder and gambling addiction s/p bariatric surgery who presents with suicidal ideation with plan to jump off a bridge.   Yesterday's psychiatric team recommendation: Increase Vraylar to 3 mg p.o. for depression 02/15/22 Continue Topamax for impulsivity and weight loss Continue Ativan 0.5 mg p.o. twice daily x 2 days for mixed mania  Per MAR review, as needed for last 24 hours include: hydroxyzine.  Assessment: On assessment today, patient was seen face-to-face and interviewed in her room.  Chart reviewed and findings shared with the treatment team and consult with Dr. Winfred Leeds.  The patient is oriented x 4, to person, place, time, and situation.  Patient presents with anxious and depressed mood.  She rates depression as 7/10, on a scale of 0-10 with 10 being the worst.  Increase Vraylar from 1.5 mg to 3 mg p.o. daily for depression.  Affect is congruent and appropriate. She reports that she has been under financial strain due to her gambling addiction.  She reports inability for her to manage her resources.   Reports sleeping for only 2 hours due to having nightmares.  Patient reports being sad due to gaining back the weight she lost after bariatric surgery.  Patient reports suicidal ideation, due to not knowing where to go when she leaves the hospital due to the poor management of her finances and potential eviction from her apartment.  She denies homicidal ideations, hallucinations, paranoia, or delusions.  Patient continues on nitrofurantoin 100 mg p.o. twice daily for UTI.  Continues on prescribed medication without any adverse reactions.  Principal Problem: Bipolar I disorder, current or most recent episode manic, severe ([place])  Diagnosis: Principal Problem:    Bipolar I disorder, current or most recent episode manic, severe ([place]) Active Problems:   UTI (urinary tract infection)   Gambling disorder, persistent  Total Time spent with patient: 45 minutes  Past Psychiatric History:  previously diagnosed with bipolar disoder and gambling disorder Has been inpatient 2 times in 2016 and once in 2018 at [facility]. Currently sees Orlie Dakin at [facility].  Previous suicide attempt x1 via overdose Has been on Latuda, lexapro, vistaril, lamictal, oxcarbazepine, trazodone    Past Medical History:  Past Medical History:  Diagnosis Date   Bacterial vaginosis    Chlamydia    Depression    Diabetes mellitus without complication ([place])    HPV in female    Hypertension    Hypothyroidism    Morbid obesity ([place])    OSA on CPAP    Post traumatic stress disorder (PTSD)    Urinary tract infection     Past Surgical History:  Procedure Laterality Date   MOLE REMOVAL     TONSILLECTOMY     WISDOM TOOTH EXTRACTION     Family History:  Family History  Problem Relation Age of Onset   Bipolar disorder Father    Bipolar disorder Mother    Cancer Mother    Family Psychiatric  History: father with substance abuse, uncle and cousin with completed suicide  Social History:  Social History   Substance and Sexual Activity  Alcohol Use Yes   Comment: socially      Social History   Substance and Sexual Activity  Drug Use No    Social History  Socioeconomic History   Marital status: Single    Spouse name: Not on file   Number of children: Not on file   Years of education: Not on file   Highest education level: Not on file  Occupational History   Not on file  Tobacco Use   Smoking status: Never   Smokeless tobacco: Never  Vaping Use   Vaping Use: Never used  Substance and Sexual Activity   Alcohol use: Yes    Comment: socially    Drug use: No   Sexual activity: Not Currently    Birth control/protection: Condom  Other Topics Concern   Not on file   Social History Narrative   Not on file   Social Determinants of Health   Financial Resource Strain: Not on file  Food Insecurity: Not on file  Transportation Needs: Not on file  Physical Activity: Not on file  Stress: Not on file  Social Connections: Not on file   Additional Social History:    Sleep: Good  Appetite:  Good  Current Medications: Current Facility-Administered Medications  Medication Dose Route Frequency Provider Last Rate Last Admin   acetaminophen (TYLENOL) tablet 650 mg  650 mg Oral Q6H PRN Lucky Rathke, FNP   650 mg at 02/13/22 0638   alum & mag hydroxide-simeth (MAALOX/MYLANTA) 200-200-20 MG/5ML suspension 30 mL  30 mL Oral Q4H PRN Lucky Rathke, FNP       amLODipine (NORVASC) tablet 10 mg  10 mg Oral Daily Lucky Rathke, FNP   10 mg at 02/15/22 0911   atorvastatin (LIPITOR) tablet 20 mg  20 mg Oral Daily Lucky Rathke, FNP   20 mg at 02/15/22 0911   [START ON 02/16/2022] cariprazine (VRAYLAR) capsule 3 mg  3 mg Oral Daily Celisa Schoenberg, Kris Hartmann, FNP       hydrOXYzine (ATARAX) tablet 25 mg  25 mg Oral TID PRN Lucky Rathke, FNP   25 mg at 02/14/22 2040   levothyroxine (SYNTHROID) tablet 50 mcg  50 mcg Oral Daily Lucky Rathke, FNP   50 mcg at 02/15/22 2979   losartan (COZAAR) tablet 25 mg  25 mg Oral Daily Lucky Rathke, FNP   25 mg at 02/15/22 0911   magnesium hydroxide (MILK OF MAGNESIA) suspension 30 mL  30 mL Oral Daily PRN Lucky Rathke, FNP       melatonin tablet 3 mg  3 mg Oral QHS Lucky Rathke, FNP   3 mg at 02/14/22 2040   multivitamin with minerals tablet 1 tablet  1 tablet Oral Daily Lucky Rathke, FNP   1 tablet at 02/15/22 0911   nitrofurantoin (macrocrystal-monohydrate) (MACROBID) capsule 100 mg  100 mg Oral Q12H Hill, Jackie Plum, MD   100 mg at 02/15/22 0911   topiramate (TOPAMAX) tablet 25 mg  25 mg Oral BID Maida Sale, MD   25 mg at 02/15/22 8921    Lab Results:  Results for orders placed or performed during the hospital encounter of  02/12/22 (from the past 48 hour(s))  Vitamin B12     Status: None   Collection Time: 02/14/22  6:43 AM  Result Value Ref Range   Vitamin B-12 634 180 - 914 pg/mL    Comment: (NOTE) This assay is not validated for testing neonatal or myeloproliferative syndrome specimens for Vitamin B12 levels. Performed at [facility], [place] [address] [place], [place] [address]   VITAMIN D 25 Hydroxy (Vit-D Deficiency, Fractures)  Status: None   Collection Time: 02/14/22  6:43 AM  Result Value Ref Range   Vit D, 25-Hydroxy 36.27 30 - 100 ng/mL    Comment: (NOTE) Vitamin D deficiency has been defined by the [place] practice guideline as a level of serum 25-OH  vitamin D less than 20 ng/mL (1,2). The Endocrine Society went on to  further define vitamin D insufficiency as a level between 21 and 29  ng/mL (2).  1. IOM (Institute of Medicine). 2010. Dietary reference intakes for  calcium and D. [place]: The [employer]. 2. Holick MF, Binkley [place], Bischoff-Ferrari HA, et al. Evaluation,  treatment, and prevention of vitamin D deficiency: an Endocrine  Society clinical practice guideline, JCEM. 2011 Jul; 96(7): 1911-30.  Performed at [facility], [place] [address], [place], [place] [address]   Rapid HIV screen (HIV 1/2 Ab+Ag)     Status: None   Collection Time: 02/14/22  6:43 AM  Result Value Ref Range   HIV-1 P24 Antigen - HIV24 NON REACTIVE NON REACTIVE    Comment: (NOTE) Detection of p24 may be inhibited by biotin in the sample, causing false negative results in acute infection.    HIV 1/2 Antibodies NON REACTIVE NON REACTIVE   Interpretation (HIV Ag Ab)      A non reactive test result means that HIV 1 or HIV 2 antibodies and HIV 1 p24 antigen were not detected in the specimen.    Comment: CALLED STRADER,W ON 02/14/22 AT 0951 BY LUZOLOP Performed at [facility], [place] [address],  [place],  [address]   Glucose, capillary     Status: Abnormal   Collection Time: 02/15/22 12:51 PM  Result Value Ref Range   Glucose-Capillary 159 (H) 70 - 99 mg/dL    Comment: Glucose reference range applies only to samples taken after fasting for at least 8 hours.    Blood Alcohol level:  Lab Results  Component Value Date   ETH <10 02/11/2022   ETH <5 16/07/930    Metabolic Disorder Labs: Lab Results  Component Value Date   HGBA1C 4.9 02/11/2022   MPG 93.93 02/11/2022   MPG 100 10/12/2016   Lab Results  Component Value Date   PROLACTIN 8.8 02/11/2022   PROLACTIN 6.4 10/12/2016   Lab Results  Component Value Date   CHOL 194 02/11/2022   TRIG 183 (H) 02/11/2022   HDL 52 02/11/2022   CHOLHDL 3.7 02/11/2022   VLDL 37 02/11/2022   LDLCALC 105 (H) 02/11/2022   LDLCALC 81 08/06/2017    Physical Findings: AIMS:  , ,  ,  ,    CIWA:    COWS:     Musculoskeletal: Strength & Muscle Tone: within normal limits Gait & Station: normal Patient leans: N/A  Psychiatric Specialty Exam:  Presentation  General Appearance: Appropriate for Environment; Casual; Fairly Groomed  Eye Contact:Good  Speech:Clear and Coherent; Normal Rate  Speech Volume:Normal  Handedness:Right  Mood and Affect  Mood:Anxious; Depressed  Affect:Appropriate; Congruent  Thought Process  Thought Processes:Coherent; Linear  Descriptions of Associations:Intact  Orientation:Full (Time, Place and Person)  Thought Content:Logical  History of Schizophrenia/Schizoaffective disorder:No  Duration of Psychotic Symptoms:No data recorded Hallucinations:Hallucinations: None  Ideas of Reference:None  Suicidal Thoughts:Suicidal Thoughts: No SI Active Intent and/or Plan: Without Intent; Without Plan; Without Means to Carry Out  Homicidal Thoughts:Homicidal Thoughts: No  Sensorium  Memory:Immediate Fair; Recent Fair; Remote [place]  Insight:Fair  Executive  Functions   Concentration:Fair  Attention Span:Fair  [place]  Language:Good  Psychomotor Activity  Psychomotor Activity:Psychomotor Activity: Normal  Assets  Assets:Physical Health; Communication Skills  Sleep  Sleep:Sleep: Poor Number of Hours of Sleep: 2  Physical Exam: Physical Exam Vitals and nursing note reviewed.  Constitutional:      Appearance: Normal appearance. She is obese.  HENT:     Head: Normocephalic and atraumatic.     Right Ear: External ear normal.     Left Ear: External ear normal.     Nose: Nose normal.     Mouth/Throat:     Mouth: Mucous membranes are moist.     Pharynx: Oropharynx is clear.  Eyes:     Extraocular Movements: Extraocular movements intact.     Conjunctiva/sclera: Conjunctivae normal.     Pupils: Pupils are equal, round, and reactive to light.  Cardiovascular:     Rate and Rhythm: Tachycardia present.     Pulses: Normal pulses.     Comments: BP 105/77, P 112, Nursing staff to recheck VS. Pulmonary:     Effort: Pulmonary effort is normal.  Abdominal:     Palpations: Abdomen is soft.  Genitourinary:    Comments: deferred Musculoskeletal:        General: Normal range of motion.     Cervical back: Normal range of motion and neck supple.  Skin:    General: Skin is warm.  Neurological:     General: No focal deficit present.     Mental Status: She is alert and oriented to person, place, and time.  Psychiatric:        Mood and Affect: Mood normal.        Behavior: Behavior normal.    Review of Systems  Constitutional: Negative.  Negative for chills and fever.  HENT: Negative.  Negative for hearing loss and tinnitus.   Eyes: Negative.  Negative for blurred vision and double vision.  Respiratory: Negative.  Negative for cough, sputum production, shortness of breath and wheezing.   Cardiovascular: Negative.  Negative for chest pain and palpitations.       BP 105/77, P 112, Nursing staff to recheck VS.    Gastrointestinal: Negative.  Negative for heartburn, nausea and vomiting.  Genitourinary: Negative.  Negative for dysuria, frequency and urgency.  Musculoskeletal: Negative.  Negative for myalgias and neck pain.  Skin: Negative.  Negative for rash.  Neurological: Negative.  Negative for dizziness and headaches.  Endo/Heme/Allergies: Negative.        Lisinopril Lisinopril  Shortness Of Breath, Cough High Allergy 08/09/2016 Deletion Reason:  Latex Latex  Itching Not Specified  12/21/2016    Psychiatric/Behavioral:  Positive for depression and suicidal ideas. The patient is nervous/anxious.    Blood pressure 105/77, pulse (!) 112, temperature 98 F (36.7 C), temperature source Oral, resp. rate 18, height '5\' 8"'$  (1.727 m), weight (!) 178.3 kg, SpO2 99 %. Body mass index is 59.76 kg/m.   Treatment Plan Summary: Daily contact with patient to assess and evaluate symptoms and progress in treatment and Medication management  Treatment Plan Summary: Daily contact with patient to assess and evaluate symptoms and progress in treatment and Medication management   Observation Level/Precautions:  15 minute checks  Laboratory: G/C, HIV, nutrition  Psychotherapy:    Medications:  Continue Vraylar. Start Topamax for impulsivity and weight loss. Ativan 0.50m PO BID x2 days for mixed mania  Macrobid '100mg'$  PO BID for UTI  Consultations:  Pending  Discharge Concerns:  Safety  Estimated LOS:5 to 7 days  Other:      Physician Treatment Plan for Primary Diagnosis: Bipolar I disorder, current or most recent episode manic, severe ([place]) Long Term Goal(s): Improvement in symptoms so as ready for discharge   Short Term Goals: Ability to identify changes in lifestyle to reduce recurrence of condition will improve, Ability to verbalize feelings will improve, Ability to disclose and discuss suicidal ideas, Ability to demonstrate self-control will improve, Ability to identify and develop effective coping behaviors  will improve, Ability to maintain clinical measurements within normal limits will improve, and Compliance with prescribed medications will improve   Physician Treatment Plan for Secondary Diagnosis: Principal Problem:   Bipolar I disorder, current or most recent episode manic, severe ([facility]) Active Problems:   UTI (urinary tract infection)   Gambling disorder, persistent   Long Term Goal(s): Improvement in symptoms so as ready for discharge   Short Term Goals: Ability to identify changes in lifestyle to reduce recurrence of condition will improve, Ability to verbalize feelings will improve, Ability to disclose and discuss suicidal ideas, Ability to demonstrate self-control will improve, Ability to identify and develop effective coping behaviors will improve, Ability to maintain clinical measurements within normal limits will improve, and Compliance with prescribed medications will improve   I certify that inpatient services furnished can reasonably be expected to improve the patient's condition.    Laretta Bolster, FNP 02/15/2022, 1:32 PMPatient ID: Allison Quarry, female   DOB: 07-13-1980, 41 y.o.   MRN: [id number]

## 2022-02-15 NOTE — Progress Notes (Signed)
Patient laying on her bed.  Stated she was feeling better.  Has had gastric sleeve and stated she felt she ate too much for lunch.  Plans to rest in her room at this time.

## 2022-02-15 NOTE — BHH Group Notes (Signed)
Child/Adolescent Psychoeducational Group Note  Date:  02/15/2022 Time:  10:36 AM  Group Topic/Focus:  Goals Group:   The focus of this group is to help patients establish daily goals to achieve during treatment and discuss how the patient can incorporate goal setting into their daily lives to aide in recovery.  Participation Level:  Active  Participation Quality:  Attentive  Affect:  Appropriate  Cognitive:  Appropriate  Insight:  Appropriate  Engagement in Group:  Engaged  Modes of Intervention:  Discussion  Additional Comments:  Patient attended goals group and was attentive the duration of it. Patient's goal was to get a discharge plan.   Kashaun Bebo T Ria Comment 02/15/2022, 10:36 AM

## 2022-02-15 NOTE — Progress Notes (Addendum)
D:  Patient's self inventory sheet, patient stated she has poor sleep, sleep medication not helpful.  Fair appetite, low energy level, poor concentration.  Rated depression, hopeful, anxiety #8.  Denied withdrawals.  SI, contracts for safety.  Physical problems, pain, dizzy, headaches, rash.  Physical pain, hand, pain medicine not helpful.  No discharge plans. A:  Medications administered per MD orders.  Emotional support and encouragement given patient.   R:  Patient denied HI.  Denied A/V hallucinations.  SI, contracts for safety, no plan to SI while at [facility].  Safety maintained with 15 minute checks.   Patient refused prn medications this morning for pain and anxiety.

## 2022-02-15 NOTE — Progress Notes (Signed)
Psychoeducational Group Note  Date:  02/15/2022 Time:  2000  Group Topic/Focus:  Wrap up group  Participation Level: Did Not Attend  Participation Quality:  Not Applicable  Affect:  Not Applicable  Cognitive:  Not Applicable  Insight:  Not Applicable  Engagement in Group: Not Applicable  Additional Comments:  Did not attend.   Shellia Cleverly 02/15/2022, 9:10 PM

## 2022-02-15 NOTE — BHH Group Notes (Signed)
Adult Psychoeducational Group  Date:  02/15/2022 Time:  1300-1400  Group Topic/Focus: Continuation of the group from Saturday. Looking at the lists that were created and talking about what needs to be done with the homework of 30 positives about themselves.                                     Talking about taking their power back and helping themselves to develop a positive self esteem.      Participation Quality: did not attend  Paulino Rily

## 2022-02-15 NOTE — Progress Notes (Signed)
Pt affect flat, mood depressed, brightens on approach, rated her day a 2/10, goal was to work on her anxiety. Received hs meds with no issues, currently denies SI/HI or hallucinations (a) 15 min checks (r) safety maintained.

## 2022-02-15 NOTE — Progress Notes (Signed)
   02/15/22 0500  Sleep  Number of Hours 7

## 2022-02-15 NOTE — Group Note (Signed)
LCSW Group Therapy Note  02/15/2022      Type of Therapy and Topic:  Group Therapy: Gratitude  Participation Level:  Active   Description of Group:   In this group, patients shared and discussed the importance of acknowledging the elements in their lives for which they are grateful and how this can positively impact their mood.  The group discussed how bringing the positive elements of their lives to the forefront of their minds can help with recovery from any illness, physical or mental.  An exercise was done as a group in which a list was made of gratitude items in order to encourage participants to consider other potential positives in their lives.  Therapeutic Goals: Patients will identify one or more item for which they are grateful in each of 6 categories:  people, experience, thing, place, skill, and other. Patients will discuss how it is possible to seek out gratitude in even bad situations. Patients will explore other possible items of gratitude that they could remember.   Summary of Patient Progress:  The patient shared that her gratitude was for friends.  She spoke freely throughout group.  At the end of group she stated that she had come into the room looking at everything negatively, but by the end of group she was looking at the positive side, so she was grateful for the topic and the group.   Therapeutic Modalities:   Solution-Focused Therapy Activity  Berlin Hun Grossman-Orr, LCSW .

## 2022-02-15 NOTE — Plan of Care (Signed)
Nurse discussed anxiety, depression and coping skills with patient.  

## 2022-02-15 NOTE — Plan of Care (Addendum)
This Probation officer called Pulmonology, and spoke to Dr. Verlee Monte regarding obtaining CPAP for patient. Informed that CPAP machine is already here on the unit, and patient is unaware of her settings, so she will require autotitration. Advised that he will pass along to RT so that they can have CPAP set up for patient at bedtime and for naps.   Patient placed on no roommate order due to CPAP.  Rosezetta Schlatter, MD PGY-2 02/15/2022  10:33 AM [place] Department of Psychiatry

## 2022-02-15 NOTE — Progress Notes (Signed)
After lunch, patient stated she felt dizzy.  Patient sat at nurse's station while VS taken.  BP 105/77  P 112,  CBG 149.

## 2022-02-16 DIAGNOSIS — F314 Bipolar disorder, current episode depressed, severe, without psychotic features: Principal | ICD-10-CM

## 2022-02-16 DIAGNOSIS — F4310 Post-traumatic stress disorder, unspecified: Secondary | ICD-10-CM

## 2022-02-16 DIAGNOSIS — F411 Generalized anxiety disorder: Secondary | ICD-10-CM

## 2022-02-16 LAB — GC/CHLAMYDIA PROBE AMP (~~LOC~~) NOT AT ARMC
Chlamydia: NEGATIVE
Comment: NEGATIVE
Comment: NORMAL
Neisseria Gonorrhea: NEGATIVE

## 2022-02-16 NOTE — Progress Notes (Signed)
Spiritual care group on grief and loss facilitated by chaplain Jerene Pitch MDiv, [facility]  Group Goal:  Support / Education around grief and loss Members engage in facilitated group support and psycho-social education.  Group Description:  Following introductions and group rules, group members engaged in facilitated group dialog and support around topic of loss, with particular support around experiences of loss in their lives. Group Identified types of loss (relationships / self / things) and identified patterns, circumstances, and changes that precipitate losses. Reflected on thoughts / feelings around loss, normalized grief responses, and recognized variety in grief experience.   Group noted Worden's four tasks of grief in discussion.  Group drew on Adlerian / Rogerian, narrative, MI, Patient Progress:  Renee Steele was present throughout group. Engaged voluntarily in group discussion with congruent affect.  Provided engagement and support with other members of group.  Noted claiming her voice and drawing boundaries in relationships as a task in grief.

## 2022-02-16 NOTE — Group Note (Signed)
Recreation Therapy Group Note   Group Topic:Stress Management  Group Date: 02/16/2022 Start Time: 0930 End Time: 0950 Facilitators: Victorino Sparrow, [place] Location: [address] Dayroom   Goal Area(s) Addresses:  Patient will identify positive stress management techniques. Patient will identify benefits of using stress management post d/c.  Group Description:  Meditation.  LRT played a mountain meditation.  This meditation focused on taking on the characteristics of the mountain into meditation and everyday life.  The meditation centered on how mountains go through each season, they are able to take on those changes and remain steady through it.  The meditation brings that same steadiness into everyday living.    Affect/Mood: N/A   Participation Level: Did not attend    Clinical Observations/Individualized Feedback:     Plan: Continue to engage patient in RT group sessions 2-3x/week.   Victorino Sparrow, LRT,CTRS 02/16/2022 11:15 AM

## 2022-02-16 NOTE — Progress Notes (Signed)
D:  Patient's self inventory sheet, patient has poor sleep, sleep medication not given.  Poor appetite, low energy level, poor concentration.  Rated depression 7, hopeless and anxiety #6.  Denied withdrawals.  Denied SI and physical problems.  Physical pain, hand.  Goal discharge.  A:  Medications administered per MD orders.  Emotional support and encouragement given patient. R:  Denied SI and HI, contracts for safety.  Denied A/V hallucinations.  Safety maintained with 15 minute checks.

## 2022-02-16 NOTE — BHH Group Notes (Signed)
[place] Group Notes:  (Nursing/MHT/Case Management/Adjunct)  Date:  02/16/2022  Time:  9:36 AM  Type of Therapy:  Group Therapy   Participation Level:  Did Not Attend   Summary of Progress/Problems:   Patient did not attend goals/ orientation group today. Patient was encouraged to go but refused.   Elza Rafter 02/16/2022, 9:36 AM

## 2022-02-16 NOTE — Progress Notes (Signed)
PATIENT SIGNED 72 HR REQUEST FOR DISCHARGE ON   02/16/2022   AT   [id number].

## 2022-02-16 NOTE — Progress Notes (Signed)
Near the beginning of the shift, pt was found resting in her bed. Upon assessment, pt reported that since she has started her new medications she feels "loppy," and reports "eating and sleeping more." She complains of feeling dizzy and having no motivation. Pt said that she was going to take a shower, but doesn't have the motivation to. Pt's blood pressure and pulse were assessed and were within her baseline. Pt was provided 240 mLs of Gatorade. Pt felt much better shortly after and was able to take a shower and change her clothes. She also came out to the nurses station to make a phone call and then participated in the milieu. Pt had a brighter affect and was encouraged to participate in the milieu more instead of isolating in her room. Pt denies SI/HI and AVH. Active listening, reassurance, and support provided. Q 15 min safety checks continue. Pt's safety has been maintained.  Pt said that she has a CPAP machine at home, but no one that can bring it to her. Pt informed about hospital policy regarding CPAP's. AC was informed and is aware.   02/15/22 2113  Psych Admission Type (Psych Patients Only)  Admission Status Voluntary  Psychosocial Assessment  Patient Complaints Anhedonia;Depression;Worrying  Eye Contact Fair  Facial Expression Animated  Affect Depressed  Speech Logical/coherent  Interaction Assertive  Motor Activity Slow  Appearance/Hygiene Improved;In hospital gown  Behavior Characteristics Cooperative;Appropriate to situation  Mood Depressed;Sad;Pleasant  Thought Process  Coherency WDL  Content WDL  Delusions None reported or observed  Perception WDL  Hallucination None reported or observed  Judgment Limited  Confusion None  Danger to Self  Current suicidal ideation? Denies  Self-Injurious Behavior No self-injurious ideation or behavior indicators observed or expressed   Agreement Not to Harm Self Yes  Description of Agreement verbally contracts for safety  Danger to Others   Danger to Others None reported or observed

## 2022-02-16 NOTE — Progress Notes (Addendum)
[facility] MD Progress Note  02/16/2022 3:40 PM Renee Steele  MRN:  [id number]  Subjective:   Renee Steele is a 41 YO F with a history of bipolar disorder and gambling addiction s/p bariatric surgery who presents with suicidal ideation with plan to jump off a bridge.   Psychiatric diagnoses being addressed during this admission: Bipolar disorder current episode is depressed, severe GAD PTSD OSA  Yesterday the psychiatry team made the following recommendations: -Increase Vraylar to 3 mg once daily -Continue topiramate 25 mg twice daily  On my assessment today, the patient reports that her mood continues to be depressed, down, sad, and hopeless, with only minimal improvement since yesterday.  Patient received first dose of increased Vraylar this morning.  Denies side effects to current psychiatric medications.  He was also tolerating  starting topiramate well.  She reports anhedonia and low motivation persist.  Reprots low energy. Reports poor sleep last night.  Patient did not get to use CPAP machine last night and I will put an order for this tonight.  She reports having worse nightmares, possibly due to poor sleep without having CPAP machine.  I also offered the patient prazosin or clonidine for this, and she declined.  Reports that appetite is okay.  Denies SI.  Denies HI.  Denies AH and VH.  She reports feeling overwhelmed by financial problems and upcoming eviction.    Principal Problem: Severe bipolar I disorder, most recent episode depressed ([place]) Diagnosis: Principal Problem:   Severe bipolar I disorder, most recent episode depressed ([place]) Active Problems:   Morbid obesity ([place])   PCOS (polycystic ovarian syndrome)   Hyperlipidemia   Hypertension   Sleep apnea   UTI (urinary tract infection)   Gambling disorder, persistent   GAD (generalized anxiety disorder)   PTSD (post-traumatic stress disorder)  Total Time spent with patient: 15 minutes  Past Psychiatric History:  previously  diagnosed with bipolar disoder and gambling disorder Has been inpatient 2 times in 2016 and once in 2018 at [facility]. Currently sees Renee Steele at [facility].  Previous suicide attempt x1 via overdose Has been on Latuda, lexapro, vistaril, lamictal, oxcarbazepine, trazodone   Past Medical History:  Past Medical History:  Diagnosis Date   Bacterial vaginosis    Chlamydia    Depression    Diabetes mellitus without complication ([place])    HPV in female    Hypertension    Hypothyroidism    Morbid obesity ([place])    OSA on CPAP    Post traumatic stress disorder (PTSD)    Urinary tract infection     Past Surgical History:  Procedure Laterality Date   MOLE REMOVAL     TONSILLECTOMY     WISDOM TOOTH EXTRACTION     Family History:  Family History  Problem Relation Age of Onset   Bipolar disorder Father    Bipolar disorder Mother    Cancer Mother    Family Psychiatric  History: father with substance abuse, uncle and cousin with completed suicide  Social History:  Social History   Substance and Sexual Activity  Alcohol Use Yes   Comment: socially      Social History   Substance and Sexual Activity  Drug Use No    Social History   Socioeconomic History   Marital status: Single    Spouse name: Not on file   Number of children: Not on file   Years of education: Not on file   Highest education level: Not on file  Occupational History  Not on file  Tobacco Use   Smoking status: Never   Smokeless tobacco: Never  Vaping Use   Vaping Use: Never used  Substance and Sexual Activity   Alcohol use: Yes    Comment: socially    Drug use: No   Sexual activity: Not Currently    Birth control/protection: Condom  Other Topics Concern   Not on file  Social History Narrative   Not on file   Social Determinants of Health   Financial Resource Strain: Not on file  Food Insecurity: Not on file  Transportation Needs: Not on file  Physical Activity: Not on file  Stress: Not on file   Social Connections: Not on file   Additional Social History:                         Sleep: Poor  Appetite:  Fair  Current Medications: Current Facility-Administered Medications  Medication Dose Route Frequency Provider Last Rate Last Admin   acetaminophen (TYLENOL) tablet 650 mg  650 mg Oral Q6H PRN Lucky Rathke, FNP   650 mg at 02/13/22 0638   alum & mag hydroxide-simeth (MAALOX/MYLANTA) 200-200-20 MG/5ML suspension 30 mL  30 mL Oral Q4H PRN Lucky Rathke, FNP       amLODipine (NORVASC) tablet 10 mg  10 mg Oral Daily Lucky Rathke, FNP   10 mg at 02/16/22 0820   atorvastatin (LIPITOR) tablet 20 mg  20 mg Oral Daily Lucky Rathke, FNP   20 mg at 02/16/22 3267   cariprazine (VRAYLAR) capsule 3 mg  3 mg Oral Daily Ntuen, Kris Hartmann, FNP   3 mg at 02/16/22 0820   hydrOXYzine (ATARAX) tablet 25 mg  25 mg Oral TID PRN Lucky Rathke, FNP   25 mg at 02/14/22 2040   levothyroxine (SYNTHROID) tablet 50 mcg  50 mcg Oral Daily Lucky Rathke, FNP   50 mcg at 02/16/22 0622   losartan (COZAAR) tablet 25 mg  25 mg Oral Daily Lucky Rathke, FNP   25 mg at 02/16/22 1245   magnesium hydroxide (MILK OF MAGNESIA) suspension 30 mL  30 mL Oral Daily PRN Lucky Rathke, FNP       melatonin tablet 3 mg  3 mg Oral QHS Lucky Rathke, FNP   3 mg at 02/15/22 2113   multivitamin with minerals tablet 1 tablet  1 tablet Oral Daily Lucky Rathke, FNP   1 tablet at 02/16/22 8099   nitrofurantoin (macrocrystal-monohydrate) (MACROBID) capsule 100 mg  100 mg Oral Q12H Hill, Jackie Plum, MD   100 mg at 02/16/22 0820   topiramate (TOPAMAX) tablet 25 mg  25 mg Oral BID Maida Sale, MD   25 mg at 02/16/22 0820    Lab Results:  Results for orders placed or performed during the hospital encounter of 02/12/22 (from the past 48 hour(s))  Glucose, capillary     Status: Abnormal   Collection Time: 02/15/22 12:51 PM  Result Value Ref Range   Glucose-Capillary 159 (H) 70 - 99 mg/dL    Comment: Glucose  reference range applies only to samples taken after fasting for at least 8 hours.    Blood Alcohol level:  Lab Results  Component Value Date   [facility] <10 02/11/2022   ETH <5 83/38/2505    Metabolic Disorder Labs: Lab Results  Component Value Date   HGBA1C 4.9 02/11/2022   MPG 93.93 02/11/2022   MPG 100 10/12/2016  Lab Results  Component Value Date   PROLACTIN 8.8 02/11/2022   PROLACTIN 6.4 10/12/2016   Lab Results  Component Value Date   CHOL 194 02/11/2022   TRIG 183 (H) 02/11/2022   HDL 52 02/11/2022   CHOLHDL 3.7 02/11/2022   VLDL 37 02/11/2022   LDLCALC 105 (H) 02/11/2022   LDLCALC 81 08/06/2017    Physical Findings: AIMS: Facial and Oral Movements Muscles of Facial Expression: None, normal Lips and Perioral Area: None, normal Jaw: None, normal Tongue: None, normal,Extremity Movements Upper (arms, wrists, hands, fingers): None, normal Lower (legs, knees, ankles, toes): None, normal, Trunk Movements Neck, shoulders, hips: None, normal, Overall Severity Severity of abnormal movements (highest score from questions above): None, normal Incapacitation due to abnormal movements: None, normal Patient's awareness of abnormal movements (rate only patient's report): No Awareness, Dental Status Current problems with teeth and/or dentures?: No Does patient usually wear dentures?: No  CIWA:    COWS:     Musculoskeletal: Strength & Muscle Tone: within normal limits Gait & Station: normal Patient leans: N/A  Psychiatric Specialty Exam:  Presentation  General Appearance: Casual  Eye Contact:Fair  Speech:Normal Rate  Speech Volume:Normal  Handedness:Right   Mood and Affect  Mood:Anxious; Depressed; Dysphoric  Affect:Restricted   Thought Process  Thought Processes:Linear  Descriptions of Associations:Intact  Orientation:Full (Time, Place and Person)  Thought Content:Logical  History of Schizophrenia/Schizoaffective disorder:No  Duration of  Psychotic Symptoms:No data recorded Hallucinations:Hallucinations: None  Ideas of Reference:None  Suicidal Thoughts:Suicidal Thoughts: No SI Active Intent and/or Plan: Without Intent; Without Plan; Without Means to Carry Out  Homicidal Thoughts:Homicidal Thoughts: No   Sensorium  Memory:Immediate Good; Recent Good; Remote Good  Judgment:Impaired  Insight:Shallow   Executive Functions  Concentration:Poor  Attention Span:Poor  [place]  Language:Good   Psychomotor Activity  Psychomotor Activity:Psychomotor Activity: Normal   Assets  Assets:Physical Health; Communication Skills   Sleep  Sleep:Sleep: Poor Number of Hours of Sleep: 2    Physical Exam: Physical Exam Vitals reviewed.  Neurological:     Mental Status: She is alert.     Motor: No weakness.     Gait: Gait normal.    Review of Systems  Psychiatric/Behavioral:  Positive for depression. The patient is nervous/anxious and has insomnia.    Blood pressure (!) 124/97, pulse (!) 115, temperature 98 F (36.7 C), temperature source Oral, resp. rate 20, height '5\' 8"'$  (1.727 m), weight (!) 178.3 kg, SpO2 98 %. Body mass index is 59.76 kg/m.   Treatment Plan Summary: Daily contact with patient to assess and evaluate symptoms and progress in treatment  Assessment: Bipolar disorder current episode is depressed, severe GAD PTSD OSA  Plan: -Continue Vraylar 3 mg once daily -Continue topiramate 25 mg twice daily for impulsivity, mood stabilization -Continue Macrobid for UTI -Start CPAP (will need to use unit CPAP) nurse and charge nurse aware.  Continue other medications for blood pressure and hyperlipidemia as ordered   Safety and Monitoring:  --  Voluntary admission to inpatient psychiatric unit for safety, stabilization and treatment  -- Daily contact with patient to assess and evaluate symptoms and progress in treatment  -- Patient's case to be discussed in  multi-disciplinary team meeting  -- Observation Level : q15 minute checks  -- Vital signs:  q12 hours  -- Precautions: suicide, elopement, and assault  Discharge Planning:   -- Social work and case management to assist with discharge planning and identification of hospital follow-up needs prior to discharge  --  Estimated LOS: 5-7 days  -- Discharge Concerns: Need to establish a safety plan; Medication compliance and effectiveness  -- Discharge Goals: Return home with outpatient referrals for mental health follow-up including medication management/psychotherapy   Christoper Allegra, MD 02/16/2022, 3:40 PM   Total Time Spent in Direct Patient Care:  I personally spent 30 minutes on the unit in direct patient care. The direct patient care time included face-to-face time with the patient, reviewing the patient's chart, communicating with other professionals, and coordinating care. Greater than 50% of this time was spent in counseling or coordinating care with the patient regarding goals of hospitalization, psycho-education, and discharge planning needs.   Janine Limbo, MD Psychiatrist '

## 2022-02-16 NOTE — Progress Notes (Signed)
Requested support and prayers with chaplain and other group members following group.    Chaplain provided support and prayers at pt request.

## 2022-02-16 NOTE — Plan of Care (Signed)
Nurse discussed anxiety, depression and coping skills with patient.  

## 2022-02-16 NOTE — BHH Counselor (Addendum)
CSW called patient caseworker with section [facility], [employer]ames[employer]al and left a voicemail for a call back.   CSW also called Sydnee from [employer] regarding getting patient connected with services. CSW left a voicemail.   English Tomer, LCSW, [place] Social Worker  [facility]

## 2022-02-17 DIAGNOSIS — F314 Bipolar disorder, current episode depressed, severe, without psychotic features: Principal | ICD-10-CM

## 2022-02-17 LAB — VITAMIN B1: Vitamin B1 (Thiamine): 90.2 nmol/L (ref 66.5–200.0)

## 2022-02-17 MED ORDER — CARIPRAZINE HCL 3 MG PO CAPS: 3.0000 mg | ORAL_CAPSULE | Freq: Every day | ORAL | 0 refills | Status: AC

## 2022-02-17 NOTE — Progress Notes (Signed)
    02/17/22 2040  Psych Admission Type (Psych Patients Only)  Admission Status Voluntary  Psychosocial Assessment  Patient Complaints None  Eye Contact Fair  Facial Expression Animated  Affect Silly  Speech Logical/coherent  Interaction Assertive  Motor Activity Slow  Appearance/Hygiene In hospital gown  Behavior Characteristics Cooperative;Appropriate to situation  Mood Euthymic;Pleasant  Thought Process  Coherency WDL  Content WDL  Delusions None reported or observed  Perception WDL  Hallucination None reported or observed  Judgment Limited  Confusion None  Danger to Self  Current suicidal ideation? Denies  Danger to Others  Danger to Others None reported or observed
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
## 2022-02-17 NOTE — Group Note (Signed)
Recreation Therapy Group Note   Group Topic:Animal Assisted Therapy   Group Date: 02/17/2022 Start Time: 1430 End Time: 1500 Facilitators: Alfhild Partch, Bjorn Loser, LRT Location: [address] Dayroom  Animal-Assisted Activity (AAA) Program Checklist/Progress Notes Patient Eligibility Criteria Checklist & Daily Group note for Rec Tx Intervention   AAA/T Program Assumption of Risk Form signed by Patient/ or Parent Legal Guardian YES  Patient is free of allergies or severe asthma  YES  Patient reports no fear of animals YES  Patient reports no history of cruelty to animals YES  Patient understands their participation is voluntary YES  Patient washes hands before animal contact YES  Patient washes hands after animal contact YES   Group Description: Patients provided opportunity to interact with trained and credentialed [employer] Therapy dog and the community volunteer/dog handler. Patients practiced appropriate animal interaction and were educated on dog safety outside of the hospital in common community settings. Patients were encouraged to socialize with one another and share about their experiences with animals and pets. Patients participated with turn taking for dog interactions with structure imposed as needed based on number of participants and quality of spontaneous participation delivered.  Goal Area(s) Addresses:  Patient will demonstrate appropriate social skills during group session.  Patient will demonstrate ability to follow instructions during group session.  Patient will identify if a reduction in stress level occurs as a result of participation in animal assisted therapy session.    Education: Contractor, Pensions consultant, Communication & Social Skills   Affect/Mood: Congruent and Euthymic to Happy   Participation Level: Engaged   Participation Quality: Independent   Behavior: Appropriate, Attentive , Cooperative, and Interactive    Modes of  Intervention: Activity, Nurse, adult, and Socialization   Patient Response to Interventions:  Interested  and Receptive   Education Outcome:  Acknowledges education   Clinical Observations/Individualized Feedback: [place] appropriately pet the visiting therapy dog, Brianna throughout group and asked relevant questions to Nurse, adult. Pt shared that they do not currently have pets. Pt expressed interest in adopting a dog post d/c to help with "depression and anxiety". LRT and community volunteer gave suggestions of local rescues and shelters to reach out to support finding the right fit for their lifestyle and needs.  Bjorn Loser Zeferino Mounts, LRT, CTRS 02/17/2022 4:48 PM

## 2022-02-17 NOTE — BHH Suicide Risk Assessment (Signed)
[place] [place] INPATIENT:  Family/Significant Other Suicide Prevention Education  Suicide Prevention Education:  Education Completed; [place], [phone]   (name of family member/significant other) has been identified by the patient as the family member/significant other with whom the patient will be residing, and identified as the person(s) who will aid the patient in the event of a mental health crisis (suicidal ideations/suicide attempt).  With written consent from the patient, the family member/significant other has been provided the following suicide prevention education, prior to the and/or following the discharge of the patient.  CSW spoke to patient support.  Support reports that patient has been going through some financial stressors and some stress with her living situation.  No additional safety concerns or access to guns/weapons.  Friend states that she is possibly facing eviction and that they don't know where she will stay if she does.  Friend reports that she has almost exhausted all financial support to help her pay her rent.   The suicide prevention education provided includes the following: Suicide risk factors Suicide prevention and interventions National Suicide Hotline telephone number [facility] assessment telephone number [facility] Emergency Assistance [place] and/or Residential Mobile Crisis Unit telephone number  Request made of family/significant other to: Remove weapons (e.g., guns, rifles, knives), all items previously/currently identified as safety concern.   Remove drugs/medications (over-the-counter, prescriptions, illicit drugs), all items previously/currently identified as a safety concern.  The family member/significant other verbalizes understanding of the suicide prevention education information provided.  The family member/significant other agrees to remove the items of safety concern listed above.  Earnestene Angello E Sahithi Ordoyne 02/17/2022, 3:52 PM

## 2022-02-17 NOTE — Plan of Care (Signed)
  Problem: Education: Goal: Knowledge of [place] [facility] Education information/materials will improve Outcome: Progressing Goal: Emotional status will improve Outcome: Progressing Goal: Mental status will improve Outcome: Progressing Goal: Verbalization of understanding the information provided will improve Outcome: Progressing   Problem: Activity: Goal: Interest or engagement in activities will improve Outcome: Progressing Goal: Sleeping patterns will improve Outcome: Progressing   Problem: Coping: Goal: Ability to verbalize frustrations and anger appropriately will improve Outcome: Progressing Goal: Ability to demonstrate self-control will improve Outcome: Progressing   Problem: Health Behavior/Discharge Planning: Goal: Identification of resources available to assist in meeting health care needs will improve Outcome: Progressing Goal: Compliance with treatment plan for underlying cause of condition will improve Outcome: Progressing   Problem: Physical Regulation: Goal: Ability to maintain clinical measurements within normal limits will improve Outcome: Progressing   Problem: Safety: Goal: Periods of time without injury will increase Outcome: Progressing   

## 2022-02-17 NOTE — Progress Notes (Signed)
Patient appears animated. Patient denies SI/HI/AVH. Pt goal for the day is "go home". Patient complied with morning medication with no reported side effects. Pt reports an increase in appetite. Pt states "I am ready to go home, I am bored there is nothing to do". Patient remains safe on Q84mn checks and contracts for safety.      02/17/22 0938  Psych Admission Type (Psych Patients Only)  Admission Status Voluntary  Psychosocial Assessment  Patient Complaints Appetite increase  Eye Contact Fair  Facial Expression Animated;Anxious  Affect Silly;Anxious  Speech Logical/coherent  Interaction Assertive  Motor Activity Fidgety  Appearance/Hygiene In hospital gown  Behavior Characteristics Cooperative;Appropriate to situation  Mood Depressed;Pleasant  Thought Process  Coherency WDL  Content WDL  Delusions None reported or observed  Perception WDL  Hallucination None reported or observed  Judgment Limited  Confusion None  Danger to Self  Current suicidal ideation? Denies  Agreement Not to Harm Self Yes  Description of Agreement verbal  Danger to Others  Danger to Others None reported or observed

## 2022-02-17 NOTE — Plan of Care (Signed)
  Problem: Education: Goal: Knowledge of [facility] Education information/materials will improve 02/17/2022 1006 by Johann Capers, RN Outcome: Progressing 02/17/2022 1005 by Johann Capers, RN Outcome: Progressing Goal: Emotional status will improve 02/17/2022 1006 by Johann Capers, RN Outcome: Progressing 02/17/2022 1005 by Johann Capers, RN Outcome: Progressing Goal: Mental status will improve 02/17/2022 1006 by Johann Capers, RN Outcome: Progressing 02/17/2022 1005 by Johann Capers, RN Outcome: Progressing Goal: Verbalization of understanding the information provided will improve 02/17/2022 1006 by Johann Capers, RN Outcome: Progressing 02/17/2022 1005 by Johann Capers, RN Outcome: Progressing   Problem: Activity: Goal: Interest or engagement in activities will improve 02/17/2022 1006 by Johann Capers, RN Outcome: Progressing 02/17/2022 1005 by Johann Capers, RN Outcome: Progressing Goal: Sleeping patterns will improve 02/17/2022 1006 by Johann Capers, RN Outcome: Progressing 02/17/2022 1005 by Johann Capers, RN Outcome: Progressing   Problem: Coping: Goal: Ability to verbalize frustrations and anger appropriately will improve 02/17/2022 1006 by Johann Capers, RN Outcome: Progressing 02/17/2022 1005 by Johann Capers, RN Outcome: Progressing Goal: Ability to demonstrate self-control will improve 02/17/2022 1006 by Johann Capers, RN Outcome: Progressing 02/17/2022 1005 by Johann Capers, RN Outcome: Progressing   Problem: Health Behavior/Discharge Planning: Goal: Identification of resources available to assist in meeting health care needs will improve 02/17/2022 1006 by Johann Capers, RN Outcome: Progressing 02/17/2022 1005 by Johann Capers, RN Outcome: Progressing Goal: Compliance with treatment plan for underlying cause of condition will improve 02/17/2022 1006 by Johann Capers, RN Outcome:  Progressing 02/17/2022 1005 by Johann Capers, RN Outcome: Progressing   Problem: Physical Regulation: Goal: Ability to maintain clinical measurements within normal limits will improve 02/17/2022 1006 by Johann Capers, RN Outcome: Progressing 02/17/2022 1005 by Johann Capers, RN Outcome: Progressing   Problem: Safety: Goal: Periods of time without injury will increase 02/17/2022 1006 by Johann Capers, RN Outcome: Progressing 02/17/2022 1005 by Johann Capers, RN Outcome: Progressing

## 2022-02-17 NOTE — Progress Notes (Signed)
[facility] MD Progress Note  02/17/2022 1:36 PM Renee Steele  MRN:  [id number]  HPI: Renee Steele is a 41 YO F with a history of bipolar disorder and gambling addiction s/p bariatric surgery who presents with suicidal ideation with plan to jump off a bridge. Psychiatric diagnoses being addressed during this admission: Bipolar disorder current episode is depressed, severe GAD PTSD OSA  Yesterday the psychiatry team made the following recommendations: -Continue Vraylar 3 mg once daily -Continue topiramate 25 mg twice daily for impulsivity, mood stabilization -Continue Macrobid for UTI -Start CPAP (will need to use unit CPAP) nurse and charge nurse aware.  Today's assessment: On assessment today, pt presents with a euthymic mood & affect is congruent. Her attention to personal hygiene and grooming is fair, eye contact is good, speech is clear & coherent. Thought contents are organized and logical, and pt currently denies SI/HI/AVH or paranoia. There is no evidence of delusional thoughts.  Pt reports a good sleep quality last night, and reports a good appetite. As per nursing documentation, pt slept a total of 7.5 hrs last night. She denies any medication related side effects.   She states that she was unable to use the CPAP provided to her last night because it had the wrong settings, and was not comfortable. Pt's assigned RN has been notified to get pt placed in a hospital type bed should one become available on the 300 hall. Pt is persistent about wanting to be discharged, and states that she wants to go home because she is facing eviction and needs to make some phone calls related to that.   We will continue pt's medications as listed below, and plan is to discharge patient tomorrow. .  Principal Problem: Severe bipolar I disorder, most recent episode depressed ([place]) Diagnosis: Principal Problem:   Severe bipolar I disorder, most recent episode depressed ([place]) Active Problems:   Morbid obesity  ([place])   PCOS (polycystic ovarian syndrome)   Hyperlipidemia   Hypertension   Sleep apnea   UTI (urinary tract infection)   Gambling disorder, persistent   GAD (generalized anxiety disorder)   PTSD (post-traumatic stress disorder)  Total Time spent with patient: 15 minutes  Past Psychiatric History:  previously diagnosed with bipolar disoder and gambling disorder Has been inpatient 2 times in 2016 and once in 2018 at [facility]. Currently sees Orlie Dakin at [facility].  Previous suicide attempt x1 via overdose Has been on Latuda, lexapro, vistaril, lamictal, oxcarbazepine, trazodone   Past Medical History:  Past Medical History:  Diagnosis Date   Bacterial vaginosis    Chlamydia    Depression    Diabetes mellitus without complication ([place])    HPV in female    Hypertension    Hypothyroidism    Morbid obesity ([place])    OSA on CPAP    Post traumatic stress disorder (PTSD)    Urinary tract infection     Past Surgical History:  Procedure Laterality Date   MOLE REMOVAL     TONSILLECTOMY     WISDOM TOOTH EXTRACTION     Family History:  Family History  Problem Relation Age of Onset   Bipolar disorder Father    Bipolar disorder Mother    Cancer Mother    Family Psychiatric  History: father with substance abuse, uncle and cousin with completed suicide  Social History:  Social History   Substance and Sexual Activity  Alcohol Use Yes   Comment: socially      Social History   Substance and  Sexual Activity  Drug Use No    Social History   Socioeconomic History   Marital status: Single    Spouse name: Not on file   Number of children: Not on file   Years of education: Not on file   Highest education level: Not on file  Occupational History   Not on file  Tobacco Use   Smoking status: Never   Smokeless tobacco: Never  Vaping Use   Vaping Use: Never used  Substance and Sexual Activity   Alcohol use: Yes    Comment: socially    Drug use: No   Sexual activity: Not  Currently    Birth control/protection: Condom  Other Topics Concern   Not on file  Social History Narrative   Not on file   Social Determinants of Health   Financial Resource Strain: Not on file  Food Insecurity: Not on file  Transportation Needs: Not on file  Physical Activity: Not on file  Stress: Not on file  Social Connections: Not on file   Additional Social History:                         Sleep: Poor  Appetite:  Fair  Current Medications: Current Facility-Administered Medications  Medication Dose Route Frequency Provider Last Rate Last Admin   acetaminophen (TYLENOL) tablet 650 mg  650 mg Oral Q6H PRN Lucky Rathke, FNP   650 mg at 02/13/22 0638   alum & mag hydroxide-simeth (MAALOX/MYLANTA) 200-200-20 MG/5ML suspension 30 mL  30 mL Oral Q4H PRN Lucky Rathke, FNP       amLODipine (NORVASC) tablet 10 mg  10 mg Oral Daily Lucky Rathke, FNP   10 mg at 02/17/22 0751   atorvastatin (LIPITOR) tablet 20 mg  20 mg Oral Daily Lucky Rathke, FNP   20 mg at 02/17/22 0750   cariprazine (VRAYLAR) capsule 3 mg  3 mg Oral Daily Ntuen, Kris Hartmann, FNP   3 mg at 02/17/22 0751   hydrOXYzine (ATARAX) tablet 25 mg  25 mg Oral TID PRN Lucky Rathke, FNP   25 mg at 02/14/22 2040   levothyroxine (SYNTHROID) tablet 50 mcg  50 mcg Oral Daily Lucky Rathke, FNP   50 mcg at 02/17/22 4782   losartan (COZAAR) tablet 25 mg  25 mg Oral Daily Lucky Rathke, FNP   25 mg at 02/17/22 0751   magnesium hydroxide (MILK OF MAGNESIA) suspension 30 mL  30 mL Oral Daily PRN Lucky Rathke, FNP       melatonin tablet 3 mg  3 mg Oral QHS Lucky Rathke, FNP   3 mg at 02/16/22 2112   multivitamin with minerals tablet 1 tablet  1 tablet Oral Daily Lucky Rathke, FNP   1 tablet at 02/17/22 0751   nitrofurantoin (macrocrystal-monohydrate) (MACROBID) capsule 100 mg  100 mg Oral Q12H Hill, Jackie Plum, MD   100 mg at 02/17/22 0751   topiramate (TOPAMAX) tablet 25 mg  25 mg Oral BID Maida Sale, MD    25 mg at 02/17/22 0751    Lab Results:  No results found for this or any previous visit (from the past 46 hour(s)).   Blood Alcohol level:  Lab Results  Component Value Date   [facility] <10 02/11/2022   ETH <5 95/62/1308    Metabolic Disorder Labs: Lab Results  Component Value Date   HGBA1C 4.9 02/11/2022   MPG 93.93 02/11/2022  MPG 100 10/12/2016   Lab Results  Component Value Date   PROLACTIN 8.8 02/11/2022   PROLACTIN 6.4 10/12/2016   Lab Results  Component Value Date   CHOL 194 02/11/2022   TRIG 183 (H) 02/11/2022   HDL 52 02/11/2022   CHOLHDL 3.7 02/11/2022   VLDL 37 02/11/2022   LDLCALC 105 (H) 02/11/2022   LDLCALC 81 08/06/2017    Physical Findings: AIMS: Facial and Oral Movements Muscles of Facial Expression: None, normal Lips and Perioral Area: None, normal Jaw: None, normal Tongue: None, normal,Extremity Movements Upper (arms, wrists, hands, fingers): None, normal Lower (legs, knees, ankles, toes): None, normal, Trunk Movements Neck, shoulders, hips: None, normal, Overall Severity Severity of abnormal movements (highest score from questions above): None, normal Incapacitation due to abnormal movements: None, normal Patient's awareness of abnormal movements (rate only patient's report): No Awareness, Dental Status Current problems with teeth and/or dentures?: No Does patient usually wear dentures?: No  CIWA:    COWS:     Musculoskeletal: Strength & Muscle Tone: within normal limits Gait & Station: normal Patient leans: N/A  Psychiatric Specialty Exam:  Presentation  General Appearance: Appropriate for Environment; Fairly Groomed  Eye Contact:Fair  Speech:Clear and Coherent  Speech Volume:Normal  Handedness:Right   Mood and Affect  Mood:Euthymic  Affect:Appropriate; Congruent   Thought Process  Thought Processes:Coherent  Descriptions of Associations:Intact  Orientation:Full (Time, Place and Person)  Thought  Content:Logical  History of Schizophrenia/Schizoaffective disorder:No  Duration of Psychotic Symptoms:No data recorded Hallucinations:Hallucinations: None  Ideas of Reference:None  Suicidal Thoughts:Suicidal Thoughts: No  Homicidal Thoughts:Homicidal Thoughts: No   Sensorium  Memory:Immediate Good  Judgment:Fair  Insight:Fair   Executive Functions  Concentration:Fair  Attention Span:Fair  [place]  Psychomotor Activity  Psychomotor Activity:Psychomotor Activity: Normal  Assets  Assets:Communication Skills  Sleep  Sleep:Sleep: Good  Physical Exam: Physical Exam Vitals reviewed.  Constitutional:      Appearance: She is obese.  HENT:     Nose: Nose normal.  Eyes:     Pupils: Pupils are equal, round, and reactive to light.  Musculoskeletal:        General: Normal range of motion.  Neurological:     Mental Status: She is alert and oriented to person, place, and time.     Motor: No weakness.     Gait: Gait normal.  Psychiatric:        Behavior: Behavior normal.    Review of Systems  Constitutional: Negative.  Negative for fever.  HENT: Negative.  Negative for hearing loss.   Eyes: Negative.   Respiratory: Negative.    Cardiovascular: Negative.   Gastrointestinal: Negative.   Genitourinary: Negative.   Musculoskeletal: Negative.   Skin: Negative.   Neurological: Negative.   Psychiatric/Behavioral:  Positive for depression. The patient is nervous/anxious and has insomnia.    Blood pressure 106/77, pulse 92, temperature 98 F (36.7 C), temperature source Oral, resp. rate 19, height '5\' 8"'$  (1.727 m), weight (!) 178.3 kg, SpO2 98 %. Body mass index is 59.76 kg/m.   Treatment Plan Summary: Daily contact with patient to assess and evaluate symptoms and progress in treatment  Assessment: Bipolar disorder current episode is depressed, severe GAD PTSD OSA  Plan: -Continue Vraylar 3 mg once  daily -Continue topiramate 25 mg twice daily for impulsivity, mood stabilization -Continue Macrobid for UTI -Continue CPAP (will need to use unit CPAP) nurse and charge nurse aware.  Continue other medications for blood pressure and hyperlipidemia as ordered  Safety and Monitoring:  --  Voluntary admission to inpatient psychiatric unit for safety, stabilization and treatment  -- Daily contact with patient to assess and evaluate symptoms and progress in treatment  -- Patient's case to be discussed in multi-disciplinary team meeting  -- Observation Level : q15 minute checks  -- Vital signs:  q12 hours  -- Precautions: suicide, elopement, and assault  Discharge Planning:   -- Social work and case management to assist with discharge planning and identification of hospital follow-up needs prior to discharge  -- Estimated LOS: 5-7 days  -- Discharge Concerns: Need to establish a safety plan; Medication compliance and effectiveness  -- Discharge Goals: Return home with outpatient referrals for mental health follow-up including medication management/psychotherapy   Nicholes Rough, NP 02/17/2022, 1:36 PM

## 2022-02-17 NOTE — BHH Counselor (Signed)
CSW provided patient with a list of payee services that she can access to assist with managing her money.    Lewi Drost, LCSW, [place] Social Worker  [facility]

## 2022-02-18 ENCOUNTER — Encounter (HOSPITAL_COMMUNITY): Payer: Self-pay

## 2022-02-18 MED ORDER — NITROFURANTOIN MONOHYD MACRO 100 MG PO CAPS: 100.0000 mg | ORAL_CAPSULE | Freq: Two times a day (BID) | ORAL | 0 refills | Status: AC

## 2022-02-18 MED ORDER — TOPIRAMATE 25 MG PO TABS: 25.0000 mg | ORAL_TABLET | Freq: Two times a day (BID) | ORAL | 0 refills | Status: DC

## 2022-02-18 NOTE — BHH Suicide Risk Assessment (Signed)
Suicide Risk Assessment  Discharge Assessment    [facility] Discharge Suicide Risk Assessment   Principal Problem: Severe bipolar I disorder, most recent episode depressed [facility])  Discharge Diagnoses: Principal Problem:   Severe bipolar I disorder, most recent episode depressed ([place]) Active Problems:   Morbid obesity ([facility])   PCOS (polycystic ovarian syndrome)   Hyperlipidemia   Hypertension   Sleep apnea   UTI (urinary tract infection)   Gambling disorder, persistent   GAD (generalized anxiety disorder)   PTSD (post-traumatic stress disorder)  Total Time spent with patient:  Greater than 30 minutes  Musculoskeletal: Strength & Muscle Tone: within normal limits Gait & Station: normal Patient leans: N/A  Psychiatric Specialty Exam  Presentation  General Appearance: Appropriate for Environment; Casual; Fairly Groomed; Other (comment) (Obese)  Eye Contact:Good  Speech:Clear and Coherent; Normal Rate  Speech Volume:Normal  Handedness:Right  Mood and Affect  Mood:Euthymic  Duration of Depression Symptoms: Greater than two weeks  Affect:Appropriate; Congruent  Thought Process  Thought Processes:Coherent  Descriptions of Associations:Intact  Orientation:Full (Time, Place and Person)  Thought Content:Logical  History of Schizophrenia/Schizoaffective disorder:No  Duration of Psychotic Symptoms:No data recorded Hallucinations:Hallucinations: None  Ideas of Reference:None  Suicidal Thoughts:Suicidal Thoughts: No SI Active Intent and/or Plan: Without Intent; Without Plan; Without Means to Carry Out; Without Access to Means  Homicidal Thoughts:Homicidal Thoughts: No  Sensorium  Memory:Immediate Good; Recent Good; Remote Good  Judgment:Good  Insight:Good  Executive Functions  Concentration:Good  Attention Span:Good  [place]  Language:Good  Psychomotor Activity  Psychomotor Activity:Psychomotor Activity: Normal  Assets   Assets:Communication Skills; Desire for Improvement; Housing; Catering manager; Resilience; Social Support  Sleep  Sleep:Sleep: Good Number of Hours of Sleep: 7   Physical Exam: See H&P.  Blood pressure 137/76, pulse (!) 134, temperature 98.1 F (36.7 C), temperature source Oral, resp. rate 18, height '5\' 8"'$  (1.727 m), weight (!) 178.3 kg, SpO2 99 %. Body mass index is 59.76 kg/m.  Mental Status Per Nursing Assessment::   On Admission:  Suicidal ideation indicated by patient  Demographic Factors:  Adolescent or young adult, Low socioeconomic status, Living alone, Unemployed, and collects SSI.  Loss Factors: Financial problems/change in socioeconomic status and Gambling problems.  Historical Factors: Family history of mental illness or substance abuse and Impulsivity  Risk Reduction Factors:   Patient is still worried about her addiction to gambling. Says the addiction is very powerful.  Continued Clinical Symptoms:  Bipolar Disorder:   Mixed State More than one psychiatric diagnosis Previous Psychiatric Diagnoses and Treatments Medical Diagnoses and Treatments/Surgeries  Cognitive Features That Contribute To Risk:  Loss of executive function, Polarized thinking, and Thought constriction (tunnel vision)    Suicide Risk:  Minimal: No identifiable suicidal ideation.  Patients presenting with no risk factors but with morbid ruminations; may be classified as minimal risk based on the severity of the depressive symptoms   [place]. Schedule an appointment as soon as possible for a visit.   Specialty: Behavioral Health Why: Please call this provider to schedule an appointment for medication management services, as we have been unable to contact. Contact information: [address] [place] [address] [phone]         [place]. Go on 03/06/2022.   Why: You have an  appointment for medication management services on  03/06/22 at 4:00 pm.  This appointment will be held in person. Contact information: [address] [place] [address] [phone]         G[facility] Addiction [facility]. Call.   Why: Please go to listed website or call for additional resources for gambling resources. Contact information: [address] [place], [place] [address] Tel: [phone] / [phone] Fax: [phone] Email: NCC[facility]cilPg'@aol'$ .com Website: www.nccouncilpg.org Helpline: 1.541 785 9545        BEHAVIORAL HEALTH OUTPATIENT THERAPY [place] Follow up.   Specialty: Behavioral Health Why: You have an appointment for therapy services on 04/01/22 at 8:40 am with [facility].  This appointment will be held in person . Contact information: [place] [phone] [place] Follow up.   Specialty: Behavioral Health Why: You may go to this provider for interim therapy services, during walk in times;  Monday and Wednesdays, arrive by 7:30 am. Contact information: [place] [phone]                Plan Of Care/Follow-up recommendations:  See the discharge recommendations above.  Lindell Spar, NP, pmhnp, fnp-bc. 02/18/2022, 10:12 AM

## 2022-02-18 NOTE — Progress Notes (Signed)
Renee Steele  D/C'd Home per MD order.  Discussed with the patient and all questions fully answered.  An After Visit Summary was printed and given to the patient. Patient received prescription. [facility] discharge suicide risk assessment given to patient at discharge.   D/c education completed with patient including follow up instructions, medication list, d/c activities limitations if indicated, with other d/c instructions as indicated by MD - patient able to verbalize understanding, all questions fully answered.   Patient instructed to return to ED, call 911, or call MD for any changes in condition.   Patient escorted to the main entrance, and D/C home via private auto.  Dorris Carnes 02/18/2022 11:04 AM

## 2022-02-18 NOTE — Care Management Important Message (Signed)
Important Message  Patient Details  Name: Renee Steele MRN: [id number] Date of Birth: 1981-01-01   Medicare Important Message Given:  Yes  Patient informed of right to appeal discharge, provided phone number to [facility]. Patient expressed no interest in appealing discharge at this time. CSW will continue to monitor situation.    [place], LCSW 02/18/2022, 9:56 AM

## 2022-02-18 NOTE — Discharge Summary (Signed)
Physician Discharge Summary Note  Patient:  Renee Steele is an 41 y.o., female MRN:  [id number] DOB:  1981-02-06 Patient phone:  [phone] (home)  Patient address:   [place] [address],   Total Time spent with patient:  Greater than 30 minutes  Date of Admission:  02/12/2022  Date of Discharge: 02-18-22  Reason for Admission: Worsening suicidal ideations with plans to jump off a bridge.  Principal Problem: Severe bipolar I disorder, most recent episode depressed [facility]) Discharge Diagnoses: Principal Problem:   Severe bipolar I disorder, most recent episode depressed ([place]) Active Problems:   Morbid obesity (HCC)   PCOS (polycystic ovarian syndrome)   Hyperlipidemia   Hypertension   Sleep apnea   UTI (urinary tract infection)   Gambling disorder, persistent   GAD (generalized anxiety disorder)   PTSD (post-traumatic stress disorder)  Past Psychiatric History: Bipolar 1 disorder, severe, GAD, PTSD.  Past Medical History:  Past Medical History:  Diagnosis Date   Bacterial vaginosis    Chlamydia    Depression    Diabetes mellitus without complication ([place])    HPV in female    Hypertension    Hypothyroidism    Morbid obesity ([place])    OSA on CPAP    Post traumatic stress disorder (PTSD)    Urinary tract infection     Past Surgical History:  Procedure Laterality Date   MOLE REMOVAL     TONSILLECTOMY     WISDOM TOOTH EXTRACTION     Family History:  Family History  Problem Relation Age of Onset   Bipolar disorder Father    Bipolar disorder Mother    Cancer Mother    Family Psychiatric  History: See H&P.   Social History:  Social History   Substance and Sexual Activity  Alcohol Use Yes   Comment: socially      Social History   Substance and Sexual Activity  Drug Use No    Social History   Socioeconomic History   Marital status: Single    Spouse name: Not on file   Number of children: Not on file   Years of  education: Not on file   Highest education level: Not on file  Occupational History   Not on file  Tobacco Use   Smoking status: Never   Smokeless tobacco: Never  Vaping Use   Vaping Use: Never used  Substance and Sexual Activity   Alcohol use: Yes    Comment: socially    Drug use: No   Sexual activity: Not Currently    Birth control/protection: Condom  Other Topics Concern   Not on file  Social History Narrative   Not on file   Social Determinants of Health   Financial Resource Strain: Not on file  Food Insecurity: Not on file  Transportation Needs: Not on file  Physical Activity: Not on file  Stress: Not on file  Social Connections: Not on file   Hospital Course:  (Per the admission evaluation notes):  41 YO F with a history of bipolar disorder and gambling addiction s/p bariatric surgery who presents with suicidal ideation with plan to jump off a bridge. On interview, the patient is hyperverbal, but able to be redirected. She explains that she had stopped taking her medications several months ago because she did not believe they were working. She has been under financial strain due to her gambling addiction. She is having relationship conflict with her siblings, who are her primary supports. Her  mood has been labile and getting worse. She is not sleeping and has nightmares when she does. She has gained back the weight she lost after bariatric surgery. She denies hallucinations, paranoia, delusions. She has been hypersexual and is concerned that she has contracted something, since she has had dysuria and frequency for 6 weeks. Over the last few days "I feel not loved" and has become suicidal with plan to jump from a bridge. No homicidal ideation.   Prior to this discharge, Amylynn was seen & evaluated for mental health stability. The current laboratory findings were reviewed (stable). The nurses notes & vital signs were reviewed as well. There are no current mental health or medical  issues that should prevent this discharge at this time. Patient is being discharged to continue mental health care as noted below.   This is one of several psychiatric admissions/discharge summaries for this 41 year old AA female with hx of chronic depression, multiple psychiatric admissions & gambling disorder, persistent. She is known in this [facility] for worsening symptoms of depression &suicidal ideations & threats. She was brought to the [facility] this time around for evaluation & treatment for complain of worsening suicidal ideations with plan to jump off a bridge. She reported on her discharge evaluation today that this was triggered because she is about to be evicted from her apartment for not paying her rent. She says she is receiving disability income, but has an addiction to gambling. She says instead of paying her outstanding bills, she will drive to a casino to gamble. Llesenia tearfully stated that her deceased mother gambled & gambling is what keeps her connected to her dead mother.  After evaluation of her presenting symptoms this time around, Srinidhi was recommended for mood stabilization treatments. The medication regimen for her presenting symptoms were discussed & with her consent initiated. She received, stabilized & was discharged on the medications as listed below on her discharge medication lists. She was also enrolled & participated in the group counseling sessions being offered & held on this unit. She learned coping skills.She presented on this admission, other chronic medical conditions that required treatment & monitoring. She was resumed/discharged on all her pertinent home medications for those health issues. She tolerated her treatment regimen without any adverse effects or reactions reported.  Monica's symptoms responded well to her treatment regimen warranting this discharge. She is currently mentally & medically for discharge. She is agreeable to this discharge as well. During the  course of this hospitalization, the 15-minute checks were adequate to ensure patient's safety.  Patient did not display any dangerous, violent or suicidal behavior on the unit.  She interacted with patients & staff appropriately. She participated appropriately in the group sessions/therapies being offered & held on this unit. She learned coping skills. Her medications were addressed & adjusted to meet her needs. She was recommended for outpatient follow-up care & medication management upon discharge to assure her continuity of care.  At the time of discharge patient is not reporting any acute suicidal/homicidal ideations. She currently denies any new issues or concerns. Education and supportive counseling provided throughout her hospital stay & upon discharge.  Today upon her discharge evaluation with her treatment team, Keyosha shares she is doing well. She denies any other specific concerns. She is sleeping well. Her appetite is good. She denies other physical complaints. She denies AH/VH, delusional thoughts or paranoia. She does not appear to be responding to any internal stimuli. She feels that her medications have been helpful &  is in agreement to continue her current treatment regimen as recommended. She was able to engage in safety planning including plan to return to [facility] or contact emergency services if she feels unable to maintain her own safety or the safety of others. Pt had no further questions, comments, or concerns. She left [facility] with all personal belongings in no apparent distress. Transportation per her herself with her car.   Physical Findings: AIMS: Facial and Oral Movements Muscles of Facial Expression: None, normal Lips and Perioral Area: None, normal Jaw: None, normal Tongue: None, normal,Extremity Movements Upper (arms, wrists, hands, fingers): None, normal Lower (legs, knees, ankles, toes): None, normal, Trunk Movements Neck, shoulders, hips: None, normal, Overall  Severity Severity of abnormal movements (highest score from questions above): None, normal Incapacitation due to abnormal movements: None, normal Patient's awareness of abnormal movements (rate only patient's report): No Awareness, Dental Status Current problems with teeth and/or dentures?: No Does patient usually wear dentures?: No  CIWA:    COWS:     Musculoskeletal: Strength & Muscle Tone: within normal limits Gait & Station: normal Patient leans: N/A  Psychiatric Specialty Exam:  Presentation  General Appearance: Appropriate for Environment; Casual; Fairly Groomed; Other (comment) (Obese)  Eye Contact:Good  Speech:Clear and Coherent; Normal Rate  Speech Volume:Normal  Handedness:Right  Mood and Affect  Mood:Euthymic  Affect:Appropriate; Congruent  Thought Process  Thought Processes:Coherent  Descriptions of Associations:Intact  Orientation:Full (Time, Place and Person)  Thought Content:Logical  History of Schizophrenia/Schizoaffective disorder:No  Duration of Psychotic Symptoms:No data recorded Hallucinations:Hallucinations: None  Ideas of Reference:None  Suicidal Thoughts:Suicidal Thoughts: No SI Active Intent and/or Plan: Without Intent; Without Plan; Without Means to Carry Out; Without Access to Means  Homicidal Thoughts:Homicidal Thoughts: No  Sensorium  Memory:Immediate Good; Recent Good; Remote Good  Judgment:Good  Insight:Good  Executive Functions  Concentration:Good  Attention Span:Good  [place]  Language:Good  Psychomotor Activity  Psychomotor Activity:Psychomotor Activity: Normal  Assets  Assets:Communication Skills; Desire for Improvement; Housing; Catering manager; Resilience; Social Support  Sleep  Sleep:Sleep: Good Number of Hours of Sleep: 7  Physical Exam: Physical Exam Vitals and nursing note reviewed.  Constitutional:      Comments: Obese  HENT:     Nose: Nose normal.      Mouth/Throat:     Pharynx: Oropharynx is clear.  Cardiovascular:     Rate and Rhythm: Normal rate.     Comments: Elevated pulse rate: 134.  Patient is in no apparent distress at this time.  Will re-check. Pulmonary:     Effort: Pulmonary effort is normal.  Genitourinary:    Comments: Deferred Musculoskeletal:        General: Normal range of motion.     Cervical back: Normal range of motion.  Skin:    General: Skin is warm and dry.  Neurological:     General: No focal deficit present.     Mental Status: She is alert and oriented to person, place, and time. Mental status is at baseline.    Review of Systems  Constitutional:  Negative for chills, diaphoresis and fever.  HENT:  Negative for congestion and sore throat.   Respiratory:  Negative for cough, shortness of breath and wheezing.   Cardiovascular:  Negative for chest pain and palpitations.  Gastrointestinal:  Negative for abdominal pain, constipation, diarrhea, heartburn, nausea and vomiting.  Musculoskeletal:  Negative for joint pain and myalgias.  Skin:  Negative for itching and rash.  Neurological:  Negative for dizziness, tingling, tremors,  sensory change, speech change, focal weakness, seizures, loss of consciousness, weakness and headaches.  Endo/Heme/Allergies:        Allergies: Lisinopril.   Other allergies: Latex.  Psychiatric/Behavioral:  Positive for depression (Hx of (stable on medication).). Negative for hallucinations, memory loss, substance abuse and suicidal ideas. The patient is not nervous/anxious (Stable upon discharge.) and does not have insomnia.    Blood pressure 137/76, pulse (!) 134, temperature 98.1 F (36.7 C), temperature source Oral, resp. rate 18, height '5\' 8"'$  (1.727 m), weight (!) 178.3 kg, SpO2 99 %. Body mass index is 59.76 kg/m.   Social History   Tobacco Use  Smoking Status Never  Smokeless Tobacco Never   Tobacco Cessation:  N/A, patient does not currently use tobacco  products  Blood Alcohol level:  Lab Results  Component Value Date   ETH <10 02/11/2022   ETH <5 92/05/9416   Metabolic Disorder Labs:  Lab Results  Component Value Date   HGBA1C 4.9 02/11/2022   MPG 93.93 02/11/2022   MPG 100 10/12/2016   Lab Results  Component Value Date   PROLACTIN 8.8 02/11/2022   PROLACTIN 6.4 10/12/2016   Lab Results  Component Value Date   CHOL 194 02/11/2022   TRIG 183 (H) 02/11/2022   HDL 52 02/11/2022   CHOLHDL 3.7 02/11/2022   VLDL 37 02/11/2022   LDLCALC 105 (H) 02/11/2022   LDLCALC 81 08/06/2017   See Psychiatric Specialty Exam and Suicide Risk Assessment completed by Attending Physician prior to discharge.  Discharge destination:  Home  Is patient on multiple antipsychotic therapies at discharge:  No   Has Patient had three or more failed trials of antipsychotic monotherapy by history:  No  Recommended Plan for Multiple Antipsychotic Therapies: NA  Discharge Instructions     Diet - low sodium heart healthy   Complete by: As directed    Increase activity slowly   Complete by: As directed       Allergies as of 02/18/2022       Reactions   Lisinopril Shortness Of Breath, Cough   Latex Itching        Medication List     TAKE these medications      Indication  amLODipine 10 MG tablet Commonly known as: NORVASC Take 1 tablet (10 mg total) by mouth daily. MUST MAKE APPT FOR FURTHER REFILLS  Indication: High Blood Pressure Disorder   atorvastatin 20 MG tablet Commonly known as: LIPITOR Take 1 tablet (20 mg total) by mouth daily.  Indication: High Amount of Fats in the Blood, Elevation of Both Cholesterol and Triglycerides in Blood   cariprazine 3 MG capsule Commonly known as: VRAYLAR Take 1 capsule (3 mg total) by mouth daily.  Indication: MIXED BIPOLAR AFFECTIVE DISORDER   cetirizine 10 MG tablet Commonly known as: ZYRTEC Take 10 mg by mouth daily.  Indication: Hayfever   fluticasone 50 MCG/ACT nasal  spray Commonly known as: FLONASE Place 1 spray into both nostrils daily as needed for allergies.  Indication: Allergic Rhinitis   levothyroxine 50 MCG tablet Commonly known as: SYNTHROID Take 1 tablet (50 mcg total) by mouth daily.  Indication: Underactive Thyroid   losartan 25 MG tablet Commonly known as: COZAAR Take 25 mg by mouth daily.  Indication: High Blood Pressure Disorder   MELATONIN GUMMIES PO Take 2 tablets by mouth at bedtime.  Indication: insomnia   multivitamin with minerals Tabs tablet Take 1 tablet by mouth daily.  Indication: vitamin   nitrofurantoin (macrocrystal-monohydrate) 100 MG  capsule Commonly known as: MACROBID Take 1 capsule (100 mg total) by mouth every 12 (twelve) hours for 5 doses.  Indication: Simple Infection of the Urinary Tract   OVER THE COUNTER MEDICATION Take 1 tablet by mouth daily. Biotin Soft Chews  Indication: biotin   topiramate 25 MG tablet Commonly known as: TOPAMAX Take 1 tablet (25 mg total) by mouth 2 (two) times daily.  Indication: Migraine Headache   VITAMIN D-3 PO Take 1 tablet by mouth daily.  Indication: vitamin        Follow-up [place], [facility]. Schedule an appointment as soon as possible for a visit.   Specialty: Behavioral Health Why: Please call this provider to schedule an appointment for medication management services, as we have been unable to contact. Contact information: [address] [place] [address] [phone]         [place]. Go on 03/06/2022.   Why: You have an appointment for medication management services on  03/06/22 at 4:00 pm.  This appointment will be held in person. Contact information: [place] [address] [phone]         Gambling Addiction Resources. Call.   Why: Please go to listed website or call for additional resources for gambling resources. Contact information: [place],  [address] Tel: [phone] / [phone] Fax: [phone] Email: [email or web address] Website: www.nccouncilpg.org Helpline: 1.858-112-0443        BEHAVIORAL HEALTH OUTPATIENT THERAPY [place] Follow up.   Specialty: Behavioral Health Why: You have an appointment for therapy services on 04/01/22 at 8:40 am with [facility].  This appointment will be held in person . Contact information: [place] [phone] [place] Follow up.   Specialty: Behavioral Health Why: You may go to this provider for interim therapy services, during walk in times;  Monday and Wednesdays, arrive by 7:30 am. Contact information: [place]               Follow-up recommendations:  Activity:  As tolerated Diet: As recommended by your primary care doctor. Keep all scheduled follow-up appointments as recommended.   Comments: Comments: Patient is recommended to follow-up care on an outpatient basis as noted above. Prescriptions sent to pt's pharmacy of choice at discharge.   Patient agreeable to plan.   Given opportunity to ask questions.   Appears to feel comfortable with discharge denies any current suicidal or homicidal thought. Patient is also instructed prior to discharge to: Take all medications as prescribed by his/her mental healthcare provider. Report any adverse effects and or reactions from the medicines to his/her outpatient provider promptly. Patient has been instructed & cautioned: To not engage in alcohol and or illegal drug use while on prescription medicines. In the event of worsening symptoms, patient is instructed to call the crisis hotline, 911 and or go to the nearest ED for appropriate evaluation and treatment of symptoms. To follow-up with his/her primary care  provider for your other medical issues, concerns and or health care needs.   Signed: Lindell Spar, NP, pmhnp, fnp-bc. 02/18/2022, 4:08 PM

## 2022-02-18 NOTE — Progress Notes (Signed)
  [facility] Adult Case Management Discharge Plan :  Will you be returning to the same living situation after discharge:  Yes,  home At discharge, do you have transportation home?: Yes,  safe transport to get patient car at [facility] Do you have the ability to pay for your medications: Yes,  insurance  Release of information consent forms completed and in the chart;  Patient's signature needed at discharge.  Patient to Follow up at:  Follow-up [place], [facility]. Schedule an appointment as soon as possible for a visit.   Specialty: Behavioral Health Why: Please call this provider to schedule an appointment for medication management services, as we have been unable to contact. Contact information: [address] [place] [address] [phone]         [place]. Go on 03/06/2022.   Why: You have an appointment for medication management services on  03/06/22 at 4:00 pm.  This appointment will be held in person. Contact information: [place] [address] [phone]         Gambling Addiction Resources. Call.   Why: Please go to listed website or call for additional resources for gambling resources. Contact information: [place] [address] [place],  [address] Tel: [phone] / [phone] Fax: [phone] Email: [email or web address] Website: www.nccouncilpg.org Helpline: 1.484 217 6659        BEHAVIORAL HEALTH OUTPATIENT THERAPY [place] Follow up.   Specialty: Behavioral Health Why: You have an appointment for therapy services on 04/01/22 at 8:40 am with [facility].  This appointment will be held in person . Contact information: [place] [phone] [place] Follow up.   Specialty: Behavioral Health Why: You may go to this provider for interim  therapy services, during walk in times;  Monday and Wednesdays, arrive by 7:30 am. Contact information: [place] [phone]                Next level of care provider has access to [place] and Suicide Prevention discussed: Yes,  friend, Sore     Has patient been referred to the Quitline?: N/A patient is not a smoker  Patient has been referred for addiction treatment: [place], LCSW 02/18/2022, 9:54 AM

## 2022-02-18 NOTE — BH IP Treatment Plan (Signed)
Interdisciplinary Treatment and Diagnostic Plan Update  02/18/2022 Time of Session: 9:40am Renee Steele MRN: [id number]  Principal Diagnosis: Severe bipolar I disorder, most recent episode depressed ([place])  Secondary Diagnoses: Principal Problem:   Severe bipolar I disorder, most recent episode depressed ([place]) Active Problems:   Morbid obesity ([place])   PCOS (polycystic ovarian syndrome)   Hyperlipidemia   Hypertension   Sleep apnea   UTI (urinary tract infection)   Gambling disorder, persistent   GAD (generalized anxiety disorder)   PTSD (post-traumatic stress disorder)   Current Medications:  Current Facility-Administered Medications  Medication Dose Route Frequency Provider Last Rate Last Admin   acetaminophen (TYLENOL) tablet 650 mg  650 mg Oral Q6H PRN Lucky Rathke, FNP   650 mg at 02/13/22 0638   alum & mag hydroxide-simeth (MAALOX/MYLANTA) 200-200-20 MG/5ML suspension 30 mL  30 mL Oral Q4H PRN Lucky Rathke, FNP       amLODipine (NORVASC) tablet 10 mg  10 mg Oral Daily Lucky Rathke, FNP   10 mg at 02/18/22 0826   atorvastatin (LIPITOR) tablet 20 mg  20 mg Oral Daily Lucky Rathke, FNP   20 mg at 02/18/22 0076   cariprazine (VRAYLAR) capsule 3 mg  3 mg Oral Daily Ntuen, Kris Hartmann, FNP   3 mg at 02/18/22 2263   hydrOXYzine (ATARAX) tablet 25 mg  25 mg Oral TID PRN Lucky Rathke, FNP   25 mg at 02/14/22 2040   levothyroxine (SYNTHROID) tablet 50 mcg  50 mcg Oral Daily Lucky Rathke, FNP   50 mcg at 02/18/22 3354   losartan (COZAAR) tablet 25 mg  25 mg Oral Daily Lucky Rathke, FNP   25 mg at 02/18/22 0826   magnesium hydroxide (MILK OF MAGNESIA) suspension 30 mL  30 mL Oral Daily PRN Lucky Rathke, FNP       melatonin tablet 3 mg  3 mg Oral QHS Lucky Rathke, FNP   3 mg at 02/17/22 2148   multivitamin with minerals tablet 1 tablet  1 tablet Oral Daily Lucky Rathke, FNP   1 tablet at 02/18/22 0826   nitrofurantoin (macrocrystal-monohydrate) (MACROBID) capsule 100 mg  100 mg Oral  Q12H Hill, Jackie Plum, MD   100 mg at 02/18/22 0826   topiramate (TOPAMAX) tablet 25 mg  25 mg Oral BID Maida Sale, MD   25 mg at 02/18/22 5625   PTA Medications: Medications Prior to Admission  Medication Sig Dispense Refill Last Dose   amLODipine (NORVASC) 10 MG tablet Take 1 tablet (10 mg total) by mouth daily. MUST MAKE APPT FOR FURTHER REFILLS 30 tablet 0    atorvastatin (LIPITOR) 20 MG tablet Take 1 tablet (20 mg total) by mouth daily. 30 tablet 3    cetirizine (ZYRTEC) 10 MG tablet Take 10 mg by mouth daily.      Cholecalciferol (VITAMIN D-3 PO) Take 1 tablet by mouth daily.      fluticasone (FLONASE) 50 MCG/ACT nasal spray Place 1 spray into both nostrils daily as needed for allergies.      levothyroxine (SYNTHROID, LEVOTHROID) 50 MCG tablet Take 1 tablet (50 mcg total) by mouth daily. 30 tablet 5    losartan (COZAAR) 25 MG tablet Take 25 mg by mouth daily.      MELATONIN GUMMIES PO Take 2 tablets by mouth at bedtime.      Multiple Vitamin (MULTIVITAMIN WITH MINERALS) TABS tablet Take 1 tablet by mouth daily.  OVER THE COUNTER MEDICATION Take 1 tablet by mouth daily. Biotin Soft Chews       Patient Stressors: Financial difficulties   Health problems    Patient Strengths: Ability for insight  Religious Affiliation   Treatment Modalities: Medication Management, Group therapy, Case management,  1 to 1 session with clinician, Psychoeducation, Recreational therapy.   Physician Treatment Plan for Primary Diagnosis: Severe bipolar I disorder, most recent episode depressed ([place]) Long Term Goal(s): Improvement in symptoms so as ready for discharge   Short Term Goals: Ability to identify changes in lifestyle to reduce recurrence of condition will improve Ability to verbalize feelings will improve Ability to disclose and discuss suicidal ideas Ability to demonstrate self-control will improve Ability to identify and develop effective coping behaviors will  improve Ability to maintain clinical measurements within normal limits will improve Compliance with prescribed medications will improve  Medication Management: Evaluate patient's response, side effects, and tolerance of medication regimen.  Therapeutic Interventions: 1 to 1 sessions, Unit Group sessions and Medication administration.  Evaluation of Outcomes: Adequate for Discharge  Physician Treatment Plan for Secondary Diagnosis: Principal Problem:   Severe bipolar I disorder, most recent episode depressed ([name]) Active Problems:   Morbid obesity ([facility])   PCOS (polycystic ovarian syndrome)   Hyperlipidemia   Hypertension   Sleep apnea   UTI (urinary tract infection)   Gambling disorder, persistent   GAD (generalized anxiety disorder)   PTSD (post-traumatic stress disorder)  Long Term Goal(s): Improvement in symptoms so as ready for discharge   Short Term Goals: Ability to identify changes in lifestyle to reduce recurrence of condition will improve Ability to verbalize feelings will improve Ability to disclose and discuss suicidal ideas Ability to demonstrate self-control will improve Ability to identify and develop effective coping behaviors will improve Ability to maintain clinical measurements within normal limits will improve Compliance with prescribed medications will improve     Medication Management: Evaluate patient's response, side effects, and tolerance of medication regimen.  Therapeutic Interventions: 1 to 1 sessions, Unit Group sessions and Medication administration.  Evaluation of Outcomes: Adequate for Discharge   RN Treatment Plan for Primary Diagnosis: Severe bipolar I disorder, most recent episode depressed ([name]) Long Term Goal(s): Knowledge of disease and therapeutic regimen to maintain health will improve  Short Term Goals: Ability to remain free from injury will improve, Ability to participate in decision making will improve, Ability to verbalize feelings  will improve, Ability to disclose and discuss suicidal ideas, and Ability to identify and develop effective coping behaviors will improve  Medication Management: RN will administer medications as ordered by provider, will assess and evaluate patient's response and provide education to patient for prescribed medication. RN will report any adverse and/or side effects to prescribing provider.  Therapeutic Interventions: 1 on 1 counseling sessions, Psychoeducation, Medication administration, Evaluate responses to treatment, Monitor vital signs and CBGs as ordered, Perform/monitor CIWA, COWS, AIMS and Fall Risk screenings as ordered, Perform wound care treatments as ordered.  Evaluation of Outcomes: Adequate for Discharge   LCSW Treatment Plan for Primary Diagnosis: Severe bipolar I disorder, most recent episode depressed ([place]) Long Term Goal(s): Safe transition to appropriate next level of care at discharge, Engage patient in therapeutic group addressing interpersonal concerns.  Short Term Goals: Engage patient in aftercare planning with referrals and resources, Increase social support, Increase emotional regulation, Facilitate acceptance of mental health diagnosis and concerns, Identify triggers associated with mental health/substance abuse issues, and Increase skills for wellness and recovery  Therapeutic Interventions:  Assess for all discharge needs, 1 to 1 time with Social worker, Explore available resources and support systems, Assess for adequacy in community support network, Educate family and significant other(s) on suicide prevention, Complete Psychosocial Assessment, Interpersonal group therapy.  Evaluation of Outcomes: Adequate for Discharge   Progress in Treatment: Attending groups: Yes. Participating in groups: Yes. Taking medication as prescribed: Yes. Toleration medication: Yes. Family/Significant other contact made: Yes, will contact:  Friend Patient understands diagnosis:  Yes. Discussing patient identified problems/goals with staff: Yes. Medical problems stabilized or resolved: Yes. Denies suicidal/homicidal ideation: No. Issues/concerns per patient self-inventory: Yes. Other: none   New problem(s) identified: No, Describe:  none   New Short Term/Long Term Goal(s): Patient to work towards medication management for mood stabilization; elimination of SI thoughts; development of comprehensive mental wellness plan.   Patient Goals:  Patient states their goal for treatment is to "I don;t know, feel better and more optimistic."    Discharge Plan or Barriers: Patient will discharge home and will follow up with [facility] for therapy and [facility] for medication management.    Reason for Continuation of Hospitalization: Medication Stabilization   Estimated Length of Stay: Adequate for Discharge    Last [place] Suicide Severity Risk Score: [place] Admission (Current) from 02/12/2022 in [facility] ED from 02/11/2022 in [facility] ED from 01/12/2021 in [facility] High Risk High Risk No Risk       Last PHQ 2/9 Scores:    02/11/2022    5:05 PM 10/17/2019   11:09 AM 11/03/2017    4:42 PM  Depression screen PHQ 2/9  Decreased Interest '1 1 2  '$ Down, Depressed, Hopeless '2 1 2  '$ PHQ - 2 Score '3 2 4  '$ Altered sleeping '2 1 2  '$ Tired, decreased energy '2 1 2  '$ Change in appetite '2 1 2  '$ Feeling bad or failure about yourself  '2 1 2  '$ Trouble concentrating 2 0 2  Moving slowly or fidgety/restless 0 0 2  Suicidal thoughts 2 0 2  PHQ-9 Score '15 6 18  '$ Difficult doing work/chores Somewhat difficult Not difficult at all     Scribe for Treatment Team: Darleen Crocker, Latanya Presser 02/18/2022 9:08 AM

## 2022-02-25 LAB — LAB REPORT - SCANNED: EGFR: 94

## 2022-03-02 ENCOUNTER — Emergency Department (HOSPITAL_COMMUNITY): Payer: Medicare HMO

## 2022-03-02 ENCOUNTER — Emergency Department (HOSPITAL_COMMUNITY)
Admission: EM | Admit: 2022-03-02 | Discharge: 2022-03-02 | Disposition: A | Discharge status: Home / Self Care | Payer: Medicare HMO | Attending: Emergency Medicine | Admitting: Emergency Medicine

## 2022-03-02 ENCOUNTER — Other Ambulatory Visit: Payer: Self-pay

## 2022-03-02 DIAGNOSIS — I10 Essential (primary) hypertension: Secondary | ICD-10-CM | POA: Diagnosis not present

## 2022-03-02 DIAGNOSIS — R0789 Other chest pain: Secondary | ICD-10-CM

## 2022-03-02 DIAGNOSIS — R9431 Abnormal electrocardiogram [ECG] [EKG]: Secondary | ICD-10-CM | POA: Diagnosis not present

## 2022-03-02 DIAGNOSIS — R072 Precordial pain: Secondary | ICD-10-CM | POA: Diagnosis not present

## 2022-03-02 DIAGNOSIS — R0602 Shortness of breath: Secondary | ICD-10-CM | POA: Diagnosis not present

## 2022-03-02 DIAGNOSIS — R079 Chest pain, unspecified: Secondary | ICD-10-CM | POA: Diagnosis present

## 2022-03-02 LAB — BASIC METABOLIC PANEL
Anion gap: 7 (ref 5–15)
BUN: 9 mg/dL (ref 6–20)
CO2: 23 mmol/L (ref 22–32)
Calcium: 9.3 mg/dL (ref 8.9–10.3)
Chloride: 111 mmol/L (ref 98–111)
Creatinine, Ser: 0.75 mg/dL (ref 0.44–1.00)
GFR, Estimated: >60 mL/min (ref >60)
Glucose, Bld: 102 mg/dL — ABNORMAL HIGH (ref 70–99)
Potassium: 4 mmol/L (ref 3.5–5.1)
Sodium: 141 mmol/L (ref 135–145)

## 2022-03-02 LAB — CBC
HCT: 40.6 % (ref 36.0–46.0)
Hemoglobin: 12.9 g/dL (ref 12.0–15.0)
MCH: 26.4 pg (ref 26.0–34.0)
MCHC: 31.8 g/dL (ref 30.0–36.0)
MCV: 83.2 fL (ref 80.0–100.0)
Platelets: 375 10*3/uL (ref 150–400)
RBC: 4.88 MIL/uL (ref 3.87–5.11)
RDW: 12.2 % (ref 11.5–15.5)
WBC: 8.4 10*3/uL (ref 4.0–10.5)
nRBC: 0 % (ref 0.0–0.2)

## 2022-03-02 LAB — I-STAT BETA HCG BLOOD, ED (MC, WL, AP ONLY): I-stat hCG, quantitative: <5 m[IU]/mL (ref <5)

## 2022-03-02 LAB — CBG MONITORING, ED: Glucose-Capillary: 130 mg/dL — ABNORMAL HIGH (ref 70–99)

## 2022-03-02 LAB — TROPONIN I (HIGH SENSITIVITY)
Troponin I (High Sensitivity): 5 ng/L (ref <18)
Troponin I (High Sensitivity): 5 ng/L (ref <18)

## 2022-03-02 MED ORDER — FAMOTIDINE 20 MG PO TABS
20.0000 mg | ORAL_TABLET | Freq: Once | ORAL | Status: AC
  Administered 2022-03-02: 20 mg via ORAL
  Filled 2022-03-02: qty 1

## 2022-03-02 MED ORDER — ALUM & MAG HYDROXIDE-SIMETH 200-200-20 MG/5ML PO SUSP
30.0000 mL | Freq: Once | ORAL | Status: AC
  Administered 2022-03-02: 30 mL via ORAL
  Filled 2022-03-02: qty 30

## 2022-03-02 MED ORDER — ACETAMINOPHEN 500 MG PO TABS
1000.0000 mg | ORAL_TABLET | Freq: Once | ORAL | Status: AC
  Administered 2022-03-02: 1000 mg via ORAL
  Filled 2022-03-02: qty 2

## 2022-03-02 NOTE — ED Notes (Addendum)
Wrong pt

## 2022-03-02 NOTE — ED Provider Notes (Signed)
[facility] EMERGENCY DEPARTMENT Provider Note   CSN: [id number] Arrival date & time: 03/02/22  1503     History  Chief Complaint  Patient presents with   Chest Pain   Shortness of Breath    Renee Steele is a 41 y.o. female.  Patient with hx htn c/o intermittent left-sided chest pain for past week. Symptoms occur at rest, sharp localized pain, lasting 2-3 seconds at a time. No constant and/or pleuritic chest pain. No exertional or more prolonged chest pain. No associated nv, diaphoresis or sob. No unusual doe or fatigue. No hx congenital heart disease. No hx cad. No fam hx premature cad. No leg pain or swelling, no recent surgery or immobility. No hx dvt or pe. No indigestion. No abd pain or nv. No neck, back or flank pain. Denies cough or uri symptoms. No fever or chills.   The history is provided by the patient, medical records and the EMS personnel.  Chest Pain Associated symptoms: shortness of breath   Associated symptoms: no abdominal pain, no back pain, no cough, no fever, no headache, no palpitations and no vomiting   Shortness of Breath Associated symptoms: chest pain   Associated symptoms: no abdominal pain, no cough, no fever, no headaches, no neck pain, no rash, no sore throat and no vomiting        Home Medications Prior to Admission medications   Medication Sig Start Date End Date Taking? Authorizing Provider  amLODipine (NORVASC) 10 MG tablet Take 1 tablet (10 mg total) by mouth daily. MUST MAKE APPT FOR FURTHER REFILLS 03/03/18   Charlott Rakes, MD  atorvastatin (LIPITOR) 20 MG tablet Take 1 tablet (20 mg total) by mouth daily. 12/17/17   Charlott Rakes, MD  cariprazine (VRAYLAR) 3 MG capsule Take 1 capsule (3 mg total) by mouth daily. 02/18/22 03/20/22  Massengill, Ovid Curd, MD  cetirizine (ZYRTEC) 10 MG tablet Take 10 mg by mouth daily.    [provider]  Cholecalciferol (VITAMIN D-3 PO) Take 1 tablet by mouth daily.    [provider]  fluticasone (FLONASE) 50 MCG/ACT nasal spray Place 1 spray into both nostrils daily as needed for allergies. 02/04/22   [provider]  levothyroxine (SYNTHROID, LEVOTHROID) 50 MCG tablet Take 1 tablet (50 mcg total) by mouth daily. 08/09/17   Alfonse Spruce, FNP  losartan (COZAAR) 25 MG tablet Take 25 mg by mouth daily. 08/22/19   [provider]  MELATONIN GUMMIES PO Take 2 tablets by mouth at bedtime.    [provider]  Multiple Vitamin (MULTIVITAMIN WITH MINERALS) TABS tablet Take 1 tablet by mouth daily.    [provider]  OVER THE COUNTER MEDICATION Take 1 tablet by mouth daily. Biotin Soft Chews    [provider]  topiramate (TOPAMAX) 25 MG tablet Take 1 tablet (25 mg total) by mouth 2 (two) times daily. 02/18/22 03/20/22  Massengill, Ovid Curd, MD      Allergies    Lisinopril and Latex    Review of Systems   Review of Systems  Constitutional:  Negative for chills and fever.  HENT:  Negative for sore throat.   Eyes:  Negative for redness.  Respiratory:  Positive for shortness of breath. Negative for cough.   Cardiovascular:  Positive for chest pain. Negative for palpitations and leg swelling.  Gastrointestinal:  Negative for abdominal pain, diarrhea and vomiting.  Genitourinary:  Negative for flank pain.  Musculoskeletal:  Negative for back pain and neck pain.  Skin:  Negative for rash.  Neurological:  Negative for syncope and headaches.  Hematological:  Does not bruise/bleed easily.  Psychiatric/Behavioral:  Negative for confusion.     Physical Exam Updated Vital Signs BP 122/79 (BP Location: Left Arm)   Pulse 81   Temp 99.1 F (37.3 C) (Oral)   Resp 16   SpO2 97%  Physical Exam Vitals and nursing note reviewed.  Constitutional:      Appearance: Normal appearance. She is well-developed.  HENT:     Head: Atraumatic.     Nose: Nose normal.     Mouth/Throat:     Mouth: Mucous membranes are moist.  Eyes:      General: No scleral icterus.    Conjunctiva/sclera: Conjunctivae normal.  Neck:     Trachea: No tracheal deviation.  Cardiovascular:     Rate and Rhythm: Normal rate and regular rhythm.     Pulses: Normal pulses.     Heart sounds: Normal heart sounds. No murmur heard.    No friction rub. No gallop.  Pulmonary:     Effort: Pulmonary effort is normal. No respiratory distress.     Breath sounds: Normal breath sounds.  Chest:     Chest wall: No tenderness.  Abdominal:     General: Bowel sounds are normal. There is no distension.     Palpations: Abdomen is soft.     Tenderness: There is no abdominal tenderness.  Genitourinary:    Comments: No cva tenderness.  Musculoskeletal:        General: No swelling or tenderness.     Cervical back: Normal range of motion and neck supple. No rigidity. No muscular tenderness.     Right lower leg: No edema.     Left lower leg: No edema.  Skin:    General: Skin is warm and dry.     Findings: No rash.  Neurological:     Mental Status: She is alert.     Comments: Alert, speech normal.   Psychiatric:        Mood and Affect: Mood normal.     ED Results / Procedures / Treatments   Labs (all labs ordered are listed, but only abnormal results are displayed) Results for orders placed or performed during the hospital encounter of 24/58/09  Basic metabolic panel  Result Value Ref Range   Sodium 141 135 - 145 mmol/L   Potassium 4.0 3.5 - 5.1 mmol/L   Chloride 111 98 - 111 mmol/L   CO2 23 22 - 32 mmol/L   Glucose, Bld 102 (H) 70 - 99 mg/dL   BUN 9 6 - 20 mg/dL   Creatinine, Ser 0.75 0.44 - 1.00 mg/dL   Calcium 9.3 8.9 - 10.3 mg/dL   GFR, Estimated >60 >60 mL/min   Anion gap 7 5 - 15  CBC  Result Value Ref Range   WBC 8.4 4.0 - 10.5 K/uL   RBC 4.88 3.87 - 5.11 MIL/uL   Hemoglobin 12.9 12.0 - 15.0 g/dL   HCT 40.6 36.0 - 46.0 %   MCV 83.2 80.0 - 100.0 fL   MCH 26.4 26.0 - 34.0 pg   MCHC 31.8 30.0 - 36.0 g/dL   RDW 12.2 11.5 - 15.5 %    Platelets 375 150 - 400 K/uL   nRBC 0.0 0.0 - 0.2 %  I-Stat beta hCG blood, ED  Result Value Ref Range   I-stat hCG, quantitative <5.0 <5 mIU/mL   Comment 3  CBG monitoring, ED  Result Value Ref Range   Glucose-Capillary 130 (H) 70 - 99 mg/dL  Troponin I (High Sensitivity)  Result Value Ref Range   Troponin I (High Sensitivity) 5 <18 ng/L  Troponin I (High Sensitivity)  Result Value Ref Range   Troponin I (High Sensitivity) 5 <18 ng/L   DG Chest 2 View  Result Date: 03/02/2022 CLINICAL DATA:  Chest pain, shortness of breath EXAM: CHEST - 2 VIEW COMPARISON:  04/04/2020 FINDINGS: The heart size and mediastinal contours are within normal limits. Both lungs are clear. The visualized skeletal structures are unremarkable. IMPRESSION: No acute abnormality of the lungs. Electronically Signed   By: Delanna Ahmadi M.D.   On: 03/02/2022 16:03     EKG EKG Interpretation  Date/Time:  Monday March 02 2022 15:03:19 EDT Ventricular Rate:  76 PR Interval:  162 QRS Duration: 84 QT Interval:  372 QTC Calculation: 418 R Axis:   38 Text Interpretation: Normal sinus rhythm Nonspecific ST and T wave abnormality `similar st appearance on previous ecgs Confirmed by Lajean Saver [phone]) on 03/02/2022 9:58:24 PM  Radiology DG Chest 2 View  Result Date: 03/02/2022 CLINICAL DATA:  Chest pain, shortness of breath EXAM: CHEST - 2 VIEW COMPARISON:  04/04/2020 FINDINGS: The heart size and mediastinal contours are within normal limits. Both lungs are clear. The visualized skeletal structures are unremarkable. IMPRESSION: No acute abnormality of the lungs. Electronically Signed   By: Delanna Ahmadi M.D.   On: 03/02/2022 16:03    Procedures Procedures    Medications Ordered in ED Medications  famotidine (PEPCID) tablet 20 mg (has no administration in time range)  alum & mag hydroxide-simeth (MAALOX/MYLANTA) 200-200-20 MG/5ML suspension 30 mL (has no administration in time range)  acetaminophen  (TYLENOL) tablet 1,000 mg (has no administration in time range)    ED Course/ Medical Decision Making/ A&P                           Medical Decision Making Problems Addressed: Atypical chest pain: acute illness or injury with systemic symptoms Essential hypertension: chronic illness or injury that poses a threat to life or bodily functions Precordial chest pain: acute illness or injury with systemic symptoms that poses a threat to life or bodily functions  Amount and/or Complexity of Data Reviewed Independent Historian: EMS    Details: hx External Data Reviewed: ECG and notes. Labs: ordered. Decision-making details documented in ED Course. Radiology: ordered and independent interpretation performed. Decision-making details documented in ED Course. ECG/medicine tests: ordered and independent interpretation performed. Decision-making details documented in ED Course.  Risk OTC drugs. Decision regarding hospitalization.  Iv ns. Continuous pulse ox and cardiac monitoring. Labs ordered/sent. Imaging ordered.   Diff dx includes msk cp, acs, atypical cp, pna/ptx, gerd - dispo decision including potential need for admission considered if trop elevation - will get labs and imaging and reassess.   Reviewed nursing notes and prior charts for additional history. External reports reviewed. Additional history from: EMS.   Cardiac monitor: sinus rhythm, rate 80.  Labs reviewed/interpreted by me - chem normal. Trop x 2 normal. Patients symptoms very brief/atypical and felt not c/w acs. Trop x 2 normal and not increasing, also not c/w acs.   Xrays reviewed/interpreted by me - no pna.   Acetaminophen po, pepcid po, maalox po.  Pt is comfortable, no distress, vitals normal. No current cp, and episodes when present last 2-3 seconds at rest.   Pt indicates  she has a cardiologist that she will f/u with as outpatient.  Return precautions provided.            Final Clinical Impression(s)  / ED Diagnoses Final diagnoses:  None    Rx / DC Orders ED Discharge Orders     None         Lajean Saver, MD 03/02/22 2219

## 2022-03-02 NOTE — Discharge Instructions (Addendum)
It was our pleasure to provide your ER care today - we hope that you feel better.  Overall, your ED tests look good/normal.   Follow up closely with your cardiologist in the next 1-2 weeks.  Return to ER if worse, persistent/recurrent chest pain, increased trouble breathing, or  other concern.

## 2022-03-02 NOTE — Progress Notes (Signed)
Cardiology Office Note   Date:  03/04/2022   ID:  Renee Steele, Renee Steele Apr 22, 1981, MRN [id number]  PCP:  Cipriano Mile, NP    No chief complaint on file.  LVH  Wt Readings from Last 3 Encounters:  03/04/22 (!) 388 lb (176 kg)  05/13/21 (!) 353 lb 3.2 oz (160.2 kg)  04/25/20 (!) 370 lb (167.8 kg)       History of Present Illness: Renee Steele is a 41 y.o. female   who had an echo in 2018.  Normal LVEF and normal valve function.    She was supposed to have weight loss surgery in Jan 2021 in [place] but she states she: "chickened out."   She was told she had heart failure by her primary care NP.  I do not have all the lab work at this time.  It is somewhat unclear how that diagnosis was made.  She did have diastolic dysfunction noted in 2018.   In the past, it was noted: "She reports some dyspnea on exertion.  She has had some aching in her chest at random times.  Is not associated with exertion.  She can notice a tenderness in her chest when she pushes on the left sternal border."   In 2021, she had weight loss surgery.  She lost 35 lbs in the past 2 months.  She had some depression and was concerned about her echo result and heart failure diagnosis.    Right thumb issues in 2023.   Hospitalized for suicidal ideations in August 2023.  Improved.    Had questions of abnormal ECG.  She was sent to the emergency room.  Work-up was negative.  She has been trying to walk more and was feeling well.  The ER trip definitely made her anxious.  Denies : exertional Chest pain. Dizziness. Leg edema. Nitroglycerin use. Orthopnea. Palpitations. Paroxysmal nocturnal dyspnea.  Syncope.    Past Medical History:  Diagnosis Date   Bacterial vaginosis    Chlamydia    Depression    Diabetes mellitus without complication ([place])    HPV in female    Hypertension    Hypothyroidism    Morbid obesity ([place])    OSA on CPAP    Post traumatic stress disorder (PTSD)    Urinary tract infection      Past Surgical History:  Procedure Laterality Date   MOLE REMOVAL     TONSILLECTOMY     WISDOM TOOTH EXTRACTION       Current Outpatient Medications  Medication Sig Dispense Refill   amLODipine (NORVASC) 10 MG tablet Take 1 tablet (10 mg total) by mouth daily. MUST MAKE APPT FOR FURTHER REFILLS 30 tablet 0   atorvastatin (LIPITOR) 20 MG tablet Take 1 tablet (20 mg total) by mouth daily. 30 tablet 3   cariprazine (VRAYLAR) 3 MG capsule Take 1 capsule (3 mg total) by mouth daily. 30 capsule 0   cetirizine (ZYRTEC) 10 MG tablet Take 10 mg by mouth daily.     Cholecalciferol (VITAMIN D-3 PO) Take 1 tablet by mouth daily.     fluticasone (FLONASE) 50 MCG/ACT nasal spray Place 1 spray into both nostrils daily as needed for allergies.     levothyroxine (SYNTHROID, LEVOTHROID) 50 MCG tablet Take 1 tablet (50 mcg total) by mouth daily. 30 tablet 5   losartan (COZAAR) 25 MG tablet Take 25 mg by mouth daily.     MELATONIN GUMMIES PO Take 2 tablets by mouth at bedtime.  Multiple Vitamin (MULTIVITAMIN WITH MINERALS) TABS tablet Take 1 tablet by mouth daily.     OVER THE COUNTER MEDICATION Take 1 tablet by mouth daily. Biotin Soft Chews     topiramate (TOPAMAX) 25 MG tablet Take 1 tablet (25 mg total) by mouth 2 (two) times daily. 60 tablet 0   No current facility-administered medications for this visit.    Allergies:   Lisinopril and Latex    Social History:  The patient  reports that she has never smoked. She has never used smokeless tobacco. She reports current alcohol use. She reports that she does not use drugs.   Family History:  The patient's family history includes Bipolar disorder in her father and mother; Cancer in her mother.    ROS:  Please see the history of present illness.   Otherwise, review of systems are positive for palpitations.   All other systems are reviewed and negative.    PHYSICAL EXAM: VS:  BP 114/72 (BP Location: Left Wrist, Patient Position: Sitting, Cuff  Size: Normal)   Pulse 100   Ht '5\' 8"'$  (1.727 m)   Wt (!) 388 lb (176 kg)   SpO2 97%   BMI 59.00 kg/m  , BMI Body mass index is 59 kg/m. GEN: Well nourished, well developed, in no acute distress HEENT: normal Neck: no JVD, carotid bruits, or masses Cardiac: RRR; distant heart sounds, no murmurs, rubs, or gallops,no edema  Respiratory:  clear to auscultation bilaterally, normal work of breathing GI: soft, nontender, nondistended, + BS, obese MS: no deformity or atrophy Skin: warm and dry, no rash Neuro:  Strength and sensation are intact Psych: euthymic mood, full affect   EKG:   The ekg ordered a few days ago demonstrates NSR, nonspecific ST changes   Recent Labs: 02/11/2022: ALT 13; Magnesium 2.0; TSH 1.429 03/02/2022: BUN 9; Creatinine, Ser 0.75; Hemoglobin 12.9; Platelets 375; Potassium 4.0; Sodium 141   Lipid Panel    Component Value Date/Time   CHOL 194 02/11/2022 1954   CHOL 139 08/06/2017 1122   TRIG 183 (H) 02/11/2022 1954   HDL 52 02/11/2022 1954   HDL 41 08/06/2017 1122   CHOLHDL 3.7 02/11/2022 1954   VLDL 37 02/11/2022 1954   LDLCALC 105 (H) 02/11/2022 1954   LDLCALC 81 08/06/2017 1122     Other studies Reviewed: Additional studies/ records that were reviewed today with results demonstrating: ER records reviewed.  Negative troponin.   ASSESSMENT AND PLAN:  HTN: The current medical regimen is effective;  continue present plan and medications. She was quite anxious about the ER visit she had.  I tried to reassure her that her cardiac work-up was negative.  Her exam is unremarkable.  I think regular exercise and improving her stamina is going to be the most important activity for her.  This will have the biggest impact on her health.  She has already started walking and feels like things are getting easier the more she does.  Continue to try to extend her exercise tolerance. Morbid obesity: Continue regular exercise. LVH: Related to high BP.  Appears  euvolemic. Hyperlipidemia: Continue atorvastatin.  LDL 105 in August 2023. Palpitations: rare.  No syncope.     Current medicines are reviewed at length with the patient today.  The patient concerns regarding her medicines were addressed.  The following changes have been made:  No change  Labs/ tests ordered today include:  No orders of the defined types were placed in this encounter.   Recommend 150  minutes/week of aerobic exercise Low fat, low carb, high fiber diet recommended  Disposition:   FU in 1 year   Signed, Larae Grooms, MD  03/04/2022 1:45 PM    [facility] [place] [facility] [place], [place], [place]  [address] Phone: [phone]; Fax: [phone]

## 2022-03-02 NOTE — ED Provider Triage Note (Signed)
Emergency Medicine Provider Triage Evaluation Note  Renee Steele , a 41 y.o. female  was evaluated in triage.  Pt complains of concerns for left sided sharp chest pain onset 1 week. Pt was sent to the ED for concerns for a MI. Has associated shortness of breath. Denies history of DM, MI, cardiac catheterization, stents.    Review of Systems  Positive: As per HPI Negative:   Physical Exam  BP 122/79 (BP Location: Left Arm)   Pulse 81   Temp 99.1 F (37.3 C) (Oral)   Resp 16   SpO2 97%  Gen:   Awake, no distress   Resp:  Normal effort  MSK:   Moves extremities without difficulty  Other:    Medical Decision Making  Medically screening exam initiated at 3:24 PM.  Appropriate orders placed.  Renee Steele was informed that the remainder of the evaluation will be completed by another provider, this initial triage assessment does not replace that evaluation, and the importance of remaining in the ED until their evaluation is complete.  Work-up initiated.    Venera Privott A, PA-C 03/02/22 1535

## 2022-03-02 NOTE — ED Triage Notes (Signed)
Pt here from PCP via [facility] for intermittent L side  sharp cp x1 week w/ sob and dizziness. Per PCP, pt's ecg showed an acute MI. Pt states the sharp pain lasts for a few minutes and goes away. Denies nausea/vomiting. Ecg w/ EMS showed no abnormality, NSR 86HR. 142/82, 97% RA.

## 2022-03-02 NOTE — ED Notes (Signed)
Pt verbalized understanding of d/c instructions, meds, and followup care. Denies questions. VSS, no distress noted. Steady gait to exit with all belongings. Taxi voucher given to [employer].

## 2022-03-04 ENCOUNTER — Ambulatory Visit: Payer: Medicare HMO | Attending: Interventional Cardiology | Admitting: Interventional Cardiology

## 2022-03-04 ENCOUNTER — Encounter: Payer: Self-pay | Admitting: Interventional Cardiology

## 2022-03-04 VITALS — BP 114/72 | HR 100 | Ht 68.0 in | Wt 388.0 lb

## 2022-03-04 DIAGNOSIS — E782 Mixed hyperlipidemia: Secondary | ICD-10-CM | POA: Diagnosis not present

## 2022-03-04 DIAGNOSIS — I10 Essential (primary) hypertension: Secondary | ICD-10-CM | POA: Diagnosis not present

## 2022-03-04 DIAGNOSIS — I517 Cardiomegaly: Secondary | ICD-10-CM | POA: Diagnosis not present

## 2022-03-04 NOTE — Patient Instructions (Addendum)
Medication Instructions:  Your physician recommends that you continue on your current medications as directed. Please refer to the Current Medication list given to you today.  *If you need a refill on your cardiac medications before your next appointment, please call your pharmacy*  Lab Work: If you have labs (blood work) drawn today and your tests are completely normal, you will receive your results only by: [place] (if you have MyChart) OR A paper copy in the mail If you have any lab test that is abnormal or we need to change your treatment, we will call you to review the results.  Testing/Procedures: None ordered today.  Follow-Up: At [facility], you and your health needs are our priority.  As part of our continuing mission to provide you with exceptional heart care, we have created designated Provider Care Teams.  These Care Teams include your primary Cardiologist (physician) and Advanced Practice Providers (APPs -  Physician Assistants and Nurse Practitioners) who all work together to provide you with the care you need, when you need it.  We recommend signing up for the patient portal called "MyChart".  Sign up information is provided on this After Visit Summary.  MyChart is used to connect with patients for Virtual Visits (Telemedicine).  Patients are able to view lab/test results, encounter notes, upcoming appointments, etc.  Non-urgent messages can be sent to your provider as well.   To learn more about what you can do with MyChart, go to [email or web address].    Your next appointment:   1 year(s)  The format for your next appointment:   In Person  Provider:   Larae Grooms, MD      Important Information About Sugar

## 2022-03-05 LAB — LAB REPORT - SCANNED: HM HIV Screening: NEGATIVE

## 2022-03-11 ENCOUNTER — Telehealth: Payer: Self-pay | Admitting: *Deleted

## 2022-03-11 NOTE — Telephone Encounter (Signed)
   Pre-operative Risk Assessment    Patient Name: Renee Steele  DOB: 02-Dec-1980 MRN: [id number]      Request for Surgical Clearance    Procedure:   RIGHT TRIGGER THUMB RELEASE  Date of Surgery:  Clearance 04/03/22                                 Surgeon:  Milly Jakob, MD Surgeon's Group or Practice Name:  [facility] Phone number:  [phone] Fax number:  [phone]   Type of Clearance Requested:   - Medical    Type of Anesthesia:  MAC   Additional requests/questions:    [name]   03/11/2022, 9:52 AM

## 2022-03-12 NOTE — Telephone Encounter (Signed)
   Primary Cardiologist: Larae Grooms, MD  Chart reviewed as part of pre-operative protocol coverage. Given past medical history and time since last visit, based on ACC/AHA guidelines, Renee Steele would be at acceptable risk for the planned procedure without further cardiovascular testing.   I will route this recommendation to the requesting party via Epic fax function and remove from pre-op pool.  Please call with questions.  Emmaline Life, NP-C    03/12/2022, 7:59 AM [address], [address] Office [phone] Fax [phone]

## 2022-04-01 ENCOUNTER — Ambulatory Visit (HOSPITAL_COMMUNITY): Payer: Medicare HMO | Admitting: Licensed Clinical Social Worker

## 2022-04-13 ENCOUNTER — Encounter (INDEPENDENT_AMBULATORY_CARE_PROVIDER_SITE_OTHER): Payer: Medicare HMO | Admitting: Internal Medicine

## 2022-04-16 ENCOUNTER — Other Ambulatory Visit: Payer: Self-pay | Admitting: Student

## 2022-04-16 DIAGNOSIS — Z1231 Encounter for screening mammogram for malignant neoplasm of breast: Secondary | ICD-10-CM

## 2022-04-29 ENCOUNTER — Other Ambulatory Visit: Payer: Self-pay | Admitting: Student

## 2022-04-29 DIAGNOSIS — N644 Mastodynia: Secondary | ICD-10-CM

## 2022-05-06 ENCOUNTER — Encounter (INDEPENDENT_AMBULATORY_CARE_PROVIDER_SITE_OTHER): Payer: Medicare HMO | Admitting: Family Medicine

## 2022-05-08 ENCOUNTER — Ambulatory Visit: Payer: Medicare HMO

## 2022-05-08 ENCOUNTER — Ambulatory Visit
Admission: RE | Admit: 2022-05-08 | Discharge: 2022-05-08 | Disposition: A | Discharge status: Home / Self Care | Payer: Medicare HMO | Source: Ambulatory Visit | Attending: Student | Admitting: Student

## 2022-05-08 DIAGNOSIS — N644 Mastodynia: Secondary | ICD-10-CM

## 2022-07-01 LAB — LAB REPORT - SCANNED
HM HIV Screening: NEGATIVE
HM Hepatitis Screen: NEGATIVE

## 2022-08-15 LAB — LAB REPORT - SCANNED
Albumin, Urine POC: 2.1
Creatinine, POC: 145 mg/dL
EGFR: 111
Microalb Creat Ratio: 14

## 2023-02-09 ENCOUNTER — Other Ambulatory Visit: Payer: Self-pay | Admitting: Obstetrics and Gynecology

## 2023-02-09 DIAGNOSIS — N912 Amenorrhea, unspecified: Secondary | ICD-10-CM

## 2023-02-09 DIAGNOSIS — E282 Polycystic ovarian syndrome: Secondary | ICD-10-CM

## 2023-02-18 ENCOUNTER — Ambulatory Visit
Admission: RE | Admit: 2023-02-18 | Discharge: 2023-02-18 | Disposition: A | Discharge status: Home / Self Care | Payer: Medicare HMO | Source: Ambulatory Visit | Attending: Obstetrics and Gynecology | Admitting: Obstetrics and Gynecology

## 2023-02-18 DIAGNOSIS — E282 Polycystic ovarian syndrome: Secondary | ICD-10-CM

## 2023-02-18 DIAGNOSIS — N912 Amenorrhea, unspecified: Secondary | ICD-10-CM

## 2023-03-11 ENCOUNTER — Other Ambulatory Visit: Payer: Self-pay | Admitting: Oncology

## 2023-03-11 DIAGNOSIS — Z006 Encounter for examination for normal comparison and control in clinical research program: Secondary | ICD-10-CM

## 2023-05-01 LAB — LAB REPORT - SCANNED: EGFR: 111

## 2023-05-17 ENCOUNTER — Ambulatory Visit: Payer: Medicare HMO | Attending: Cardiology | Admitting: Cardiology

## 2023-05-17 ENCOUNTER — Encounter: Payer: Self-pay | Admitting: Cardiology

## 2023-05-17 VITALS — BP 116/72 | HR 115 | Resp 16 | Ht 68.0 in | Wt >= 6400 oz

## 2023-05-17 DIAGNOSIS — G4730 Sleep apnea, unspecified: Secondary | ICD-10-CM | POA: Diagnosis not present

## 2023-05-17 DIAGNOSIS — I10 Essential (primary) hypertension: Secondary | ICD-10-CM

## 2023-05-17 DIAGNOSIS — I517 Cardiomegaly: Secondary | ICD-10-CM

## 2023-05-17 DIAGNOSIS — E782 Mixed hyperlipidemia: Secondary | ICD-10-CM | POA: Diagnosis not present

## 2023-05-17 NOTE — Progress Notes (Signed)
Cardiology Office Note:   Date:  05/17/2023  ID:  Renee Steele, DOB March 02, 1981, MRN [id number] PCP: Hillery Aldo, NP  [facility] Providers Cardiologist:  Lance Muss, MD    History of Present Illness:   Discussed the use of AI scribe software for clinical note transcription with the patient, who gave verbal consent to proceed.  History of Present Illness   The patient, a 42 year old individual with a history of hypertension, left ventricular hypertrophy, hyperlipidemia, palpitations, and obesity, presents for annual follow up.   Today she reports concerns about weight gain and depression. Despite undergoing weight loss surgery three years ago, she reports compulsive eating and a return to her pre-surgery weight. She recently started on Ozempic, prescribed by her primary care provider, and has lost 2-3 pounds without experiencing any known side effects. However, she reports occasional sharp chest pains and palpitations, which occur at rest and are not associated with any identifiable triggers. These episodes are infrequent, occurring approximately once a month. She denies any chest discomfort with exertion.  The patient also reports shortness of breath and some leg swelling, which she attributes to her obesity. She denies any regular feelings of dizziness or lightheadedness. She also reports occasional rapid heartbeats, but cannot identify any specific triggers.   The patient also reports right arm pain, which she attributes to a trigger thumb and neuropathy. She denies any symptoms suggestive of a cardiac origin for this pain. She also reports a history of sleep apnea, for which she has been prescribed a CPAP machine, but she does not use it due to discomfort.  The patient's blood pressure is well-controlled on her current regimen of amlodipine and losartan. She has not been monitoring her blood pressure at home. She also reports a history of elevated cholesterol, but recent  labs are not available for review. She is currently on atorvastatin for cholesterol management.  The patient has a history of a heart murmur, but no murmur was appreciated on today's examination. She also reports a previous episode of tachycardia that resulted in an ER visit, but cardiac enzymes were normal at that time.  The patient also reports a recent minor injury to her finger, which occurred while taking out the trash. She is concerned about the risk of infection, but the wound is small and she is managing it with standard wound care.      Studies Reviewed:    EKG:   EKG Interpretation Date/Time:  Monday May 17 2023 15:49:40 EST Ventricular Rate:  103 PR Interval:  138 QRS Duration:  74 QT Interval:  334 QTC Calculation: 437 R Axis:   45  Text Interpretation: Sinus tachycardia Nonspecific ST and T wave abnormality When compared with ECG of 02-Mar-2022 15:03, No significant change was found Confirmed by Perlie Gold [phone]) on 05/17/2023 7:43:26 PM    Risk Assessment/Calculations:              Physical Exam:   VS:  BP 116/72 (BP Location: Left Wrist, Patient Position: Sitting, Cuff Size: Large)   Pulse (!) 115   Resp 16   Ht 5\' 8"  (1.727 m)   Wt (!) 427 lb 3.2 oz (193.8 kg)   SpO2 96%   BMI 64.96 kg/m    Wt Readings from Last 3 Encounters:  05/17/23 (!) 427 lb 3.2 oz (193.8 kg)  03/04/22 (!) 388 lb (176 kg)  05/13/21 (!) 353 lb 3.2 oz (160.2 kg)     Physical Exam Vitals reviewed.  Constitutional:  Appearance: She is obese.  HENT:     Head: Normocephalic.     Nose: Nose normal.  Eyes:     Pupils: Pupils are equal, round, and reactive to light.  Cardiovascular:     Rate and Rhythm: Normal rate and regular rhythm.     Pulses: Normal pulses.     Heart sounds: Normal heart sounds. No murmur heard.    No friction rub. No gallop.  Pulmonary:     Effort: Pulmonary effort is normal.     Breath sounds: Normal breath sounds.  Musculoskeletal:     Right  lower leg: Edema (mixed) present.     Left lower leg: Edema (mixed) present.  Skin:    General: Skin is warm and dry.     Capillary Refill: Capillary refill takes less than 2 seconds.  Neurological:     General: No focal deficit present.     Mental Status: She is alert and oriented to person, place, and time.  Psychiatric:        Mood and Affect: Mood normal.        Behavior: Behavior normal.        Thought Content: Thought content normal.        Judgment: Judgment normal.     ASSESSMENT AND PLAN:     Assessment and Plan    Obesity Reports compulsive eating and weight gain despite prior weight loss surgery. Currently on Ozempic 0.5 mg with minimal weight loss (2-3 lbs) and no significant side effects. Vision changes reported, eye doctor appointment scheduled. Discussed weight loss benefits for overall health, including potential reduction in blood pressure and cholesterol levels. Discussed alternative CPAP masks for sleep apnea management to aid weight loss and cardiovascular health. - Follow-up with primary care provider for weight management - Monitor weight and dietary habits  Sleep Apnea Does not use CPAP due to discomfort. Discussed importance of managing sleep apnea for weight loss and cardiovascular health. - Discuss alternative CPAP masks with provider - Reinforce importance of CPAP use for weight loss and cardiovascular health  Hypertension Blood pressure today is 116/72 mmHg, improved from previous 138/72 mmHg. Currently on amlodipine 10 mg and losartan 25 mg. Discussed potential for weight loss to further improve blood pressure and reduce medication needs. - Continue amlodipine 10 mg daily - Continue losartan 25 mg daily - Monitor blood pressure regularly  Hyperlipidemia Last LDL was 105 mg/dL. Currently on atorvastatin 20 mg. Plan to review recent labs from primary care provider to determine current cholesterol levels. - Request release of records to obtain recent  lab results - Continue atorvastatin 20 mg daily - Order lipid panel if recent labs are not available  Palpitations and Chest Pain Intermittent sharp chest pains and palpitations, occurring at rest and infrequently (once a month). No associated dizziness or significant shortness of breath. Recent EKG showed heart rate of 114 bpm, 88 bpm on recheck. No murmur detected. Symptoms likely musculoskeletal or stress-related. - Monitor for increased frequency or severity of symptoms - Reassure regarding non-cardiac nature of symptoms - Follow-up if symptoms worsen  Depression Reports worsening depression. She does see a mental health provider regularly. - Continue follow-up with mental health provider - Monitor for changes in mental health status  Follow-up - Schedule follow-up with Dr. Odis Hollingshead in six months - Schedule annual follow-up with PA - Request release of records from primary care provider to review recent labs.              Signed,  Perlie Gold, PA-C

## 2023-05-17 NOTE — Patient Instructions (Signed)
Medication Instructions:  Your physician recommends that you continue on your current medications as directed. Please refer to the Current Medication list given to you today.  *If you need a refill on your cardiac medications before your next appointment, please call your pharmacy*  Lab Work: None ordered today. If you have labs (blood work) drawn today and your tests are completely normal, you will receive your results only by: MyChart Message (if you have MyChart) OR A paper copy in the mail If you have any lab test that is abnormal or we need to change your treatment, we will call you to review the results.  Testing/Procedures: None ordered today.  Follow-Up: At [facility], you and your health needs are our priority.  As part of our continuing mission to provide you with exceptional heart care, we have created designated Provider Care Teams.  These Care Teams include your primary Cardiologist (physician) and Advanced Practice Providers (APPs -  Physician Assistants and Nurse Practitioners) who all work together to provide you with the care you need, when you need it.  Your next appointment:   6 month(s)  The format for your next appointment:   In Person  Provider:   Dr. Tessa Lerner {

## 2023-06-23 ENCOUNTER — Other Ambulatory Visit: Payer: Self-pay | Admitting: Student

## 2023-06-23 DIAGNOSIS — Z1231 Encounter for screening mammogram for malignant neoplasm of breast: Secondary | ICD-10-CM

## 2023-06-28 ENCOUNTER — Ambulatory Visit
Admission: RE | Admit: 2023-06-28 | Discharge: 2023-06-28 | Disposition: A | Discharge status: Home / Self Care | Payer: Medicare HMO | Source: Ambulatory Visit | Attending: Student | Admitting: Student

## 2023-06-28 DIAGNOSIS — Z1231 Encounter for screening mammogram for malignant neoplasm of breast: Secondary | ICD-10-CM

## 2023-07-10 LAB — LAB REPORT - SCANNED
Albumin, Urine POC: 2.6
Creatinine, POC: 202 mg/dL
EGFR: 110
Microalb Creat Ratio: 13

## 2023-07-23 ENCOUNTER — Other Ambulatory Visit: Payer: Self-pay | Admitting: Orthopedic Surgery

## 2023-07-26 ENCOUNTER — Encounter (HOSPITAL_COMMUNITY): Payer: Self-pay | Admitting: Orthopedic Surgery

## 2023-07-26 NOTE — Progress Notes (Signed)
SDW call  Patient was given pre-op instructions over the phone. Patient verbalized understanding of instructions provided.     PCP - Paulita Cradle, NP Cardiologist - Dr. Lance Muss Pulmonary:    PPM/ICD - denies Device Orders - na Rep Notified - na   Chest x-ray - na EKG -  05/17/2023 Stress Test - ECHO - 09/28/2019 Cardiac Cath -   Sleep Study/sleep apnea/CPAP: OSA with CPAP  Type II diabetic.  Does not check her blood sugar Fasting Blood sugar range: does not check How often check sugars: does not check Metformin, instructed to hold DOS   Blood Thinner Instructions: denies Aspirin Instructions:denies   ERAS Protcol - Clears until 0430   Anesthesia review: Yes. HTN, DM, BMI 65, OSA on CPAP   Patient denies shortness of breath, fever, cough and chest pain over the phone call  Your procedure is scheduled on Wednesday July 28, 2023  Report to [facility] Main Entrance "A" at 0530   A.M., then check in with the Admitting office.  Call this number if you have problems the morning of surgery:  [phone]   If you have any questions prior to your surgery date call [phone]: Open Monday-Friday 8am-4pm If you experience any cold or flu symptoms such as cough, fever, chills, shortness of breath, etc. between now and your scheduled surgery, please notify us at the above number    Remember:  Do not eat after midnight the night before your surgery  You may drink clear liquids until  0430   the morning of your surgery.   Clear liquids allowed are: Water, Non-Citrus Juices (without pulp), Carbonated Beverages, Clear Tea, Black Coffee ONLY (NO MILK, CREAM OR POWDERED CREAMER of any kind), and Gatorade   Take these medicines the morning of surgery with A SIP OF WATER:  Amlodipine, atorvastatin, levothyroxine, provera, macrobid, vraylar  As needed: flonase  As of today, STOP taking any Aspirin (unless otherwise instructed by your surgeon) Aleve, Naproxen, Ibuprofen,  Motrin, Advil, Goody's, BC's, all herbal medications, fish oil, and all vitamins.

## 2023-07-27 ENCOUNTER — Encounter (HOSPITAL_COMMUNITY): Payer: Self-pay | Admitting: Physician Assistant

## 2023-07-27 NOTE — Progress Notes (Signed)
Patient left a voicemail at 0430 am to inform SDW nurse she has been vomiting and having diarrhea all night.  Returned patients call and instructed her to call Dr. Janalyn Harder office when they open to make them aware and possible need for re-scheduling surgery.  Spoke with Toni Amend at Dr. Carollee Massed office, she will make Darel Hong, surgery scheduler aware.

## 2023-07-28 ENCOUNTER — Encounter (HOSPITAL_COMMUNITY): Payer: Self-pay | Admitting: Physician Assistant

## 2023-07-28 ENCOUNTER — Ambulatory Visit (HOSPITAL_COMMUNITY): Admission: RE | Admit: 2023-07-28 | Payer: 59 | Source: Ambulatory Visit | Admitting: Orthopedic Surgery

## 2023-07-28 SURGERY — RELEASE TRIGGER FINGER/A-1 PULLEY
Anesthesia: Monitor Anesthesia Care | Laterality: Right

## 2023-08-04 ENCOUNTER — Other Ambulatory Visit: Payer: Self-pay | Admitting: Orthopedic Surgery

## 2023-08-05 NOTE — H&P (Signed)
History: CC / Reason for Visit: Right thumb problem HPI: This patient returns today, wishing to further discuss details related to surgery.  Specifically, she would like to discuss details of the glomus tumor excision should that occur.  HPI 05-06-23: This patient returns to clinic today for reevaluation.  She indicates that she continues to have pain on the volar aspect of her thumb, but also around her nail which is sensitive to heat and cold.  She reports that she has not currently locking or catching, but that she still has pain over the A1 pulley with some enlargement of the tendon clearly palpable.  We did receive Dr. 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  We do not have the images.  The patient indicates that she has called [facility] and requested the disc to be sent here.    HPI 08/18/2022: This patient returns to clinic today for reevaluation indicating that she continues to have pain into her right thumb with locking and catching.  She has had injections beginning in June 2020 with the most recent one being May 2023.  She reminds me that she did see Dr. Dayna Barker with [facility] in [place], [place] and stated that she has had an MRI study but never went back to him as she did not feel he was helpful.  Additionally, she reports that he thought she had a glomerulus tumor, but no hypersensitivity to cold or heat especially not in the pad of her thumb.  When we saw her last in September 2023, she plans to move forward with right trigger thumb release.  We never did receive records from Dr. Mallie Darting office and therefore do not have them to review.  She is here today specifically requesting another injection into her right trigger thumb.    HPI 03/10/22:This patient returns to clinic today for reevaluation of her right thumb problem and right hand numbness and tingling.  She states that the right thumb problem is her more  specific issue.  She reports that she had about 3 months of relief after the last injection into her thumb.  She also received an injection into her right cubital tunnel with ultrasound guidance, but never returned to clinic for reevaluation.  She reports that since then she has seen Dr. Theodis Aguas with [facility] and had an MRI study, she thinks of her hand, which we do not have access to.  She is here today hoping for an injection into her right thumb, but is willing to sign paperwork for [place] to review the MRI study and Dr. Theodis Aguas notes.    HPI 11/25/21:This patient presents for reevaluation, having undergone interval NCS/EMG with Dr. Regino Schultze on 11-05-21, interpreted as without right upper extremity radiculopathy, mononeuropathy, or polyneuropathy.  She reports that for years ago she was on gabapentin but was weary of taking it, at that time having numbness and tingling in her arms.  Sometimes she also gets it in her legs and feet.  Her right thumb remains painful, and she reports that she is paid more attention to the numbness and tingling and finds that affects mostly the ring and small fingers of both hands, but also medial forearm.  HPI 10-09-21: Patient returns to clinic today indicating that she has a new problem.  She reports that she has numbness and tingling into her right arm and right hand, all of her digits.  She reports this is been present  for approximately 2 years.  She states there was not an accident or injury.  She notices it most when she is sleeping on her side.  She also indicates that her right thumb has once again become painful about the A1 pulley.  Her last injection was 11/07/2019.    HPI: 11/07/2019 This patient returns to clinic today for reevaluation of her right thumb trigger.  She states that it is back and wishes to move forward with 1 additional injection.    HPI 07/13/19:This patient returns reevaluation, and again that her right thumb remains sore, no longer locking and  catching with each cycle, desiring repeat injection to achieve full resolution.  HPI 06-13-19: This patient returns to clinic today for reevaluation of her right thumb problem.  She indicates that it has once again begun locking and catching and does so very frequently.  She reports it is worse in the morning.  She states that after the injection she received on 01/03/2019, felt significantly better.    HPI 01/03/2019:This patient presents for evaluation of her right thumb, indicating that she fell and struck it some 3 months ago.  She is disabled, and has underlying neuropathy.  She has developed pain in the volar aspect of the thumb with catching and clicking with movement.  She has been taking OTC NSAIDs and the problem persists.  Review of systems as related to current complaint reviewed and unchanged.  Exam:  Vitals: Refer to EMR. Constitutional:  WD, WN, NAD HEENT:  NCAT, EOMI Neuro/Psych:  Alert & oriented to person, place, and time; appropriate mood & affect Lymphatic: No generalized UE edema or lymphadenopathy Extremities / MSK:  Both UE are normal with respect to appearance, ranges of motion, joint stability, muscle strength/tone, sensation, & perfusion except as otherwise noted:  Right thumb tender over the A1 pulley, with mildly decreased range of motion, inducible catching noted.  Hypersensitivity around the nailbed as well as the eponychial fold.  Subjective stinging-type sensation with Tinel's.  Labs / Xrays:  No new x-rays.  3 views of the right thumb ordered and obtained 01/03/2019 reveals no fractures, dislocations, or bony destructive lesions.  Assessment: 1.  Right trigger thumb- injected 01/03/2019, 06/13/2019, & 07-13-19, 11/07/2019, 10/09/2021, 11-18-21, 08/18/2022 2.  Right upper extremity numbness and tingling, normal NCS/EMG, with mixed clinical evidence for cervical origin versus possible cubital tunnel syndrome, possibly also with more generalized neuropathy. 3.  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  Plan:  We discussed these findings.  We also discussed that removing the glomus tumor will not help the trigger finger and vice versa.  She decided today that she like to proceed with just the trigger thumb release.  We will do this under local/MAC anesthesia in an outpatient setting, and Darel Hong will contact her to arrange for such.    The details of the operative procedure were discussed with the patient.  Questions were invited and answered.  The goal of the procedure was reviewed.  The risks of the procedure includes but is not limited to bleeding; infection; damage to the nerves or blood vessels that could result in bleeding, numbness, weakness, chronic pain, and the need for additional procedures; stiffness; the need for revision surgery; and anesthetic risks, including death. No specific outcome was guaranteed or implied.  Informed consent was obtained.

## 2023-08-06 ENCOUNTER — Encounter (HOSPITAL_COMMUNITY): Payer: Self-pay | Admitting: Orthopedic Surgery

## 2023-08-06 NOTE — Progress Notes (Signed)
PCP - Paulita Cradle, NP  Cardiologist - Dr. Lance Muss   Chest x-ray - 03/02/22 EKG - 05/17/23 ECHO - 09/28/19  CPAP - Wears at night  GLP-1: No longer takes Ozempic. Stopped taking it last November  Fasting Blood Sugar - Does not check blood sugars  ERAS Protcol - Clears until 0430  Anesthesia review: Y  Patient verbally denies any shortness of breath, fever, cough and chest pain during phone call   -------------  SDW INSTRUCTIONS given:  Your procedure is scheduled on Monday, February 3rd.  Report to [facility] Main Entrance "A" at 0530 A.M., and check in at the Admitting office.  Call this number if you have problems the morning of surgery:  [phone]   Remember:  Do not eat after midnight the night before your surgery  You may drink clear liquids until 0430 the morning of your surgery.   Clear liquids allowed are: Water, Non-Citrus Juices (without pulp), Carbonated Beverages, Clear Tea, Black Coffee Only, and Gatorade    Take these medicines the morning of surgery with A SIP OF WATER  amLODipine (NORVASC)  atorvastatin (LIPITOR)  levothyroxine (SYNTHROID, LEVOTHROID)  VRAYLAR  fluticasone (FLONASE)-if needed  As of today, STOP taking any Aspirin (unless otherwise instructed by your surgeon) Aleve, Naproxen, Ibuprofen, Motrin, Advil, Goody's, BC's, all herbal medications, fish oil, and all vitamins.                      Do not wear jewelry, make up, or nail polish            Do not wear lotions, powders, perfumes/colognes, or deodorant.            Do not shave 48 hours prior to surgery.  Men may shave face and neck.            Do not bring valuables to the hospital.            [facility] is not responsible for any belongings or valuables.  Do NOT Smoke (Tobacco/Vaping) 24 hours prior to your procedure If you use a CPAP at night, you may bring all equipment for your overnight stay.   Contacts, glasses, dentures or bridgework may not be worn into surgery.       For patients admitted to the hospital, discharge time will be determined by your treatment team.   Patients discharged the day of surgery will not be allowed to drive home, and someone needs to stay with them for 24 hours.    Special instructions:   [place]- Preparing For Surgery  Before surgery, you can play an important role. Because skin is not sterile, your skin needs to be as free of germs as possible. You can reduce the number of germs on your skin by washing with CHG (chlorahexidine gluconate) Soap before surgery.  CHG is an antiseptic cleaner which kills germs and bonds with the skin to continue killing germs even after washing.    Oral Hygiene is also important to reduce your risk of infection.  Remember - BRUSH YOUR TEETH THE MORNING OF SURGERY WITH YOUR REGULAR TOOTHPASTE  Please do not use if you have an allergy to CHG or antibacterial soaps. If your skin becomes reddened/irritated stop using the CHG.  Do not shave (including legs and underarms) for at least 48 hours prior to first CHG shower. It is OK to shave your face.  Please follow these instructions carefully.   Shower the [employer] [facility] and  the MORNING OF SURGERY with DIAL Soap.   Pat yourself dry with a CLEAN TOWEL.  Wear CLEAN PAJAMAS to bed the night before surgery  Place CLEAN SHEETS on your bed the night of your first shower and DO NOT SLEEP WITH PETS.   Day of Surgery: Please shower morning of surgery  Wear Clean/Comfortable clothing the morning of surgery Do not apply any deodorants/lotions.   Remember to brush your teeth WITH YOUR REGULAR TOOTHPASTE.   Questions were answered. Patient verbalized understanding of instructions.

## 2023-08-08 NOTE — Anesthesia Preprocedure Evaluation (Signed)
Anesthesia Evaluation  Patient identified by MRN, date of birth, ID band Patient awake    Reviewed: Allergy & Precautions, NPO status , Patient's Chart, lab work & pertinent test results  Airway Mallampati: III  TM Distance: >3 FB Neck ROM: Full    Dental  (+) Teeth Intact, Dental Advisory Given   Pulmonary sleep apnea and Continuous Positive Airway Pressure Ventilation    Pulmonary exam normal breath sounds clear to auscultation       Cardiovascular hypertension, Pt. on medications Normal cardiovascular exam Rhythm:Regular Rate:Normal     Neuro/Psych  PSYCHIATRIC DISORDERS Anxiety Depression Bipolar Disorder   negative neurological ROS     GI/Hepatic negative GI ROS, Neg liver ROS,,,  Endo/Other  diabetes, Type 2, Oral Hypoglycemic AgentsHypothyroidism  Class 4 obesity (BMI 65)  Renal/GU negative Renal ROS     Musculoskeletal negative musculoskeletal ROS (+)    Abdominal   Peds  Hematology negative hematology ROS (+)   Anesthesia Other Findings   Reproductive/Obstetrics                             Anesthesia Physical Anesthesia Plan  ASA: 4  Anesthesia Plan: MAC   Post-op Pain Management: Minimal or no pain anticipated and Tylenol PO (pre-op)*   Induction: Intravenous  PONV Risk Score and Plan: 2 and TIVA, Midazolam, Dexamethasone and Ondansetron  Airway Management Planned: Natural Airway and Simple Face Mask  Additional Equipment:   Intra-op Plan:   Post-operative Plan:   Informed Consent: I have reviewed the patients History and Physical, chart, labs and discussed the procedure including the risks, benefits and alternatives for the proposed anesthesia with the patient or authorized representative who has indicated his/her understanding and acceptance.     Dental advisory given  Plan Discussed with: CRNA  Anesthesia Plan Comments:        Anesthesia Quick  Evaluation

## 2023-08-09 ENCOUNTER — Encounter (HOSPITAL_COMMUNITY): Payer: Self-pay | Admitting: Orthopedic Surgery

## 2023-08-09 ENCOUNTER — Ambulatory Visit (HOSPITAL_COMMUNITY): Payer: 59 | Admitting: Physician Assistant

## 2023-08-09 ENCOUNTER — Other Ambulatory Visit: Payer: Self-pay

## 2023-08-09 ENCOUNTER — Encounter (HOSPITAL_COMMUNITY)
Admission: RE | Disposition: A | Discharge status: Home / Self Care | Payer: Self-pay | Source: Ambulatory Visit | Attending: Orthopedic Surgery

## 2023-08-09 ENCOUNTER — Ambulatory Visit (HOSPITAL_COMMUNITY)
Admission: RE | Admit: 2023-08-09 | Discharge: 2023-08-09 | Disposition: A | Discharge status: Home / Self Care | Payer: 59 | Source: Ambulatory Visit | Attending: Orthopedic Surgery | Admitting: Orthopedic Surgery

## 2023-08-09 ENCOUNTER — Ambulatory Visit (HOSPITAL_BASED_OUTPATIENT_CLINIC_OR_DEPARTMENT_OTHER): Payer: 59 | Admitting: Physician Assistant

## 2023-08-09 DIAGNOSIS — Z7984 Long term (current) use of oral hypoglycemic drugs: Secondary | ICD-10-CM

## 2023-08-09 DIAGNOSIS — E119 Type 2 diabetes mellitus without complications: Secondary | ICD-10-CM

## 2023-08-09 DIAGNOSIS — M65311 Trigger thumb, right thumb: Secondary | ICD-10-CM | POA: Diagnosis not present

## 2023-08-09 DIAGNOSIS — G4730 Sleep apnea, unspecified: Secondary | ICD-10-CM | POA: Diagnosis not present

## 2023-08-09 DIAGNOSIS — E6689 Other obesity not elsewhere classified: Secondary | ICD-10-CM | POA: Diagnosis not present

## 2023-08-09 DIAGNOSIS — F319 Bipolar disorder, unspecified: Secondary | ICD-10-CM | POA: Insufficient documentation

## 2023-08-09 DIAGNOSIS — I10 Essential (primary) hypertension: Secondary | ICD-10-CM | POA: Diagnosis not present

## 2023-08-09 DIAGNOSIS — E039 Hypothyroidism, unspecified: Secondary | ICD-10-CM | POA: Diagnosis not present

## 2023-08-09 DIAGNOSIS — G629 Polyneuropathy, unspecified: Secondary | ICD-10-CM | POA: Diagnosis not present

## 2023-08-09 DIAGNOSIS — F419 Anxiety disorder, unspecified: Secondary | ICD-10-CM | POA: Insufficient documentation

## 2023-08-09 DIAGNOSIS — Z79899 Other long term (current) drug therapy: Secondary | ICD-10-CM | POA: Diagnosis not present

## 2023-08-09 DIAGNOSIS — Z6841 Body Mass Index (BMI) 40.0 and over, adult: Secondary | ICD-10-CM | POA: Insufficient documentation

## 2023-08-09 HISTORY — PX: TRIGGER FINGER RELEASE: SHX641

## 2023-08-09 LAB — CBC
HCT: 40.6 % (ref 36.0–46.0)
Hemoglobin: 13 g/dL (ref 12.0–15.0)
MCH: 26.2 pg (ref 26.0–34.0)
MCHC: 32 g/dL (ref 30.0–36.0)
MCV: 81.7 fL (ref 80.0–100.0)
Platelets: 403 10*3/uL — ABNORMAL HIGH (ref 150–400)
RBC: 4.97 MIL/uL (ref 3.87–5.11)
RDW: 12.3 % (ref 11.5–15.5)
WBC: 6.4 10*3/uL (ref 4.0–10.5)
nRBC: 0 % (ref 0.0–0.2)

## 2023-08-09 LAB — BASIC METABOLIC PANEL
Anion gap: 11 (ref 5–15)
BUN: 6 mg/dL (ref 6–20)
CO2: 23 mmol/L (ref 22–32)
Calcium: 8.7 mg/dL — ABNORMAL LOW (ref 8.9–10.3)
Chloride: 108 mmol/L (ref 98–111)
Creatinine, Ser: 0.81 mg/dL (ref 0.44–1.00)
GFR, Estimated: >60 mL/min (ref >60)
Glucose, Bld: 115 mg/dL — ABNORMAL HIGH (ref 70–99)
Potassium: 3.4 mmol/L — ABNORMAL LOW (ref 3.5–5.1)
Sodium: 142 mmol/L (ref 135–145)

## 2023-08-09 LAB — GLUCOSE, CAPILLARY
Glucose-Capillary: 132 mg/dL — ABNORMAL HIGH (ref 70–99)
Glucose-Capillary: 120 mg/dL — ABNORMAL HIGH (ref 70–99)

## 2023-08-09 LAB — POCT PREGNANCY, URINE: Preg Test, Ur: NEGATIVE

## 2023-08-09 SURGERY — RELEASE, A1 PULLEY, FOR TRIGGER FINGER
Anesthesia: Monitor Anesthesia Care | Site: Hand | Laterality: Right

## 2023-08-09 MED ORDER — PROPOFOL 10 MG/ML IV BOLUS
INTRAVENOUS | Status: AC
  Filled 2023-08-09: qty 20

## 2023-08-09 MED ORDER — PROPOFOL 10 MG/ML IV BOLUS
INTRAVENOUS | Status: DC | PRN
  Administered 2023-08-09: 70 mg via INTRAVENOUS

## 2023-08-09 MED ORDER — PROPOFOL 500 MG/50ML IV EMUL
INTRAVENOUS | Status: DC | PRN
  Administered 2023-08-09: 100 ug/kg/min via INTRAVENOUS

## 2023-08-09 MED ORDER — TRAMADOL HCL 50 MG PO TABS: 50.0000 mg | ORAL_TABLET | Freq: Four times a day (QID) | ORAL | 0 refills | Status: DC | PRN

## 2023-08-09 MED ORDER — DEXAMETHASONE SODIUM PHOSPHATE 10 MG/ML IJ SOLN
INTRAMUSCULAR | Status: DC | PRN
  Administered 2023-08-09: 4 mg via INTRAVENOUS

## 2023-08-09 MED ORDER — FENTANYL CITRATE (PF) 250 MCG/5ML IJ SOLN
INTRAMUSCULAR | Status: AC
  Filled 2023-08-09: qty 5

## 2023-08-09 MED ORDER — INSULIN ASPART 100 UNIT/ML IJ SOLN: 0.0000 [IU] | INTRAMUSCULAR | Status: DC | PRN

## 2023-08-09 MED ORDER — PROPOFOL 1000 MG/100ML IV EMUL
INTRAVENOUS | Status: AC
  Filled 2023-08-09: qty 100

## 2023-08-09 MED ORDER — LIDOCAINE HCL (PF) 1 % IJ SOLN
INTRAMUSCULAR | Status: DC | PRN
  Administered 2023-08-09: 13 mL

## 2023-08-09 MED ORDER — CHLORHEXIDINE GLUCONATE 0.12 % MT SOLN
15.0000 mL | Freq: Once | OROMUCOSAL | Status: AC
  Administered 2023-08-09: 15 mL via OROMUCOSAL
  Filled 2023-08-09: qty 15

## 2023-08-09 MED ORDER — MIDAZOLAM HCL 2 MG/2ML IJ SOLN
INTRAMUSCULAR | Status: AC
  Filled 2023-08-09: qty 2

## 2023-08-09 MED ORDER — BUPIVACAINE HCL (PF) 0.25 % IJ SOLN
INTRAMUSCULAR | Status: AC
  Filled 2023-08-09: qty 30

## 2023-08-09 MED ORDER — DEXMEDETOMIDINE HCL IN NACL 80 MCG/20ML IV SOLN
INTRAVENOUS | Status: DC | PRN
  Administered 2023-08-09: 4 ug via INTRAVENOUS
  Administered 2023-08-09: 20 ug via INTRAVENOUS
  Administered 2023-08-09: 8 ug via INTRAVENOUS

## 2023-08-09 MED ORDER — SODIUM CHLORIDE 0.9 % IV SOLN: INTRAVENOUS | Status: DC | PRN

## 2023-08-09 MED ORDER — LACTATED RINGERS IV SOLN: INTRAVENOUS | Status: DC

## 2023-08-09 MED ORDER — ORAL CARE MOUTH RINSE: 15.0000 mL | Freq: Once | OROMUCOSAL | Status: AC

## 2023-08-09 MED ORDER — LIDOCAINE HCL (PF) 1 % IJ SOLN
INTRAMUSCULAR | Status: AC
  Filled 2023-08-09: qty 30

## 2023-08-09 MED ORDER — ONDANSETRON HCL 4 MG/2ML IJ SOLN
INTRAMUSCULAR | Status: AC
  Filled 2023-08-09: qty 2

## 2023-08-09 MED ORDER — MIDAZOLAM HCL 2 MG/2ML IJ SOLN
INTRAMUSCULAR | Status: DC | PRN
  Administered 2023-08-09: 2 mg via INTRAVENOUS

## 2023-08-09 MED ORDER — ACETAMINOPHEN 500 MG PO TABS
1000.0000 mg | ORAL_TABLET | Freq: Once | ORAL | Status: AC
  Administered 2023-08-09: 1000 mg via ORAL
  Filled 2023-08-09: qty 2

## 2023-08-09 MED ORDER — ACETAMINOPHEN 325 MG PO TABS: 650.0000 mg | ORAL_TABLET | Freq: Four times a day (QID) | ORAL | Status: AC

## 2023-08-09 MED ORDER — ONDANSETRON HCL 4 MG/2ML IJ SOLN
INTRAMUSCULAR | Status: DC | PRN
  Administered 2023-08-09: 4 mg via INTRAVENOUS

## 2023-08-09 MED ORDER — BUPIVACAINE HCL (PF) 0.25 % IJ SOLN
INTRAMUSCULAR | Status: DC | PRN
  Administered 2023-08-09: 3 mL

## 2023-08-09 MED ORDER — CEFAZOLIN SODIUM-DEXTROSE 3-4 GM/150ML-% IV SOLN
3.0000 g | INTRAVENOUS | Status: AC
  Administered 2023-08-09: 3 g via INTRAVENOUS
  Filled 2023-08-09: qty 150

## 2023-08-09 MED ORDER — DEXAMETHASONE SODIUM PHOSPHATE 10 MG/ML IJ SOLN
INTRAMUSCULAR | Status: AC
  Filled 2023-08-09: qty 1

## 2023-08-09 MED ORDER — DEXMEDETOMIDINE HCL IN NACL 80 MCG/20ML IV SOLN
INTRAVENOUS | Status: AC
  Filled 2023-08-09: qty 20

## 2023-08-09 MED ORDER — 0.9 % SODIUM CHLORIDE (POUR BTL) OPTIME
TOPICAL | Status: DC | PRN
  Administered 2023-08-09: 1000 mL

## 2023-08-09 SURGICAL SUPPLY — 29 items
BAG COUNTER SPONGE SURGICOUNT (BAG) ×1 IMPLANT
BLADE SURG 15 STRL LF DISP TIS (BLADE) ×1 IMPLANT
BNDG COHESIVE 1X5 TAN STRL LF (GAUZE/BANDAGES/DRESSINGS) IMPLANT
BNDG COHESIVE 4X5 TAN STRL (GAUZE/BANDAGES/DRESSINGS) ×1 IMPLANT
BNDG ESMARK 4X9 LF (GAUZE/BANDAGES/DRESSINGS) ×1 IMPLANT
BNDG GAUZE DERMACEA FLUFF 4 (GAUZE/BANDAGES/DRESSINGS) ×2 IMPLANT
BNDG STRETCH 4X75 STRL LF (GAUZE/BANDAGES/DRESSINGS) IMPLANT
CANISTER SUCT 3000ML PPV (MISCELLANEOUS) ×1 IMPLANT
CHLORAPREP W/TINT 26 (MISCELLANEOUS) ×1 IMPLANT
CORD BIPOLAR FORCEPS 12FT (ELECTRODE) ×1 IMPLANT
CUFF TRNQT CYL 24X4X16.5-23 (TOURNIQUET CUFF) IMPLANT
DRAPE SURG 17X23 STRL (DRAPES) ×1 IMPLANT
DRSG ADAPTIC 3X8 NADH LF (GAUZE/BANDAGES/DRESSINGS) ×1 IMPLANT
DRSG EMULSION OIL 3X3 NADH (GAUZE/BANDAGES/DRESSINGS) IMPLANT
GAUZE SPONGE 4X4 12PLY STRL (GAUZE/BANDAGES/DRESSINGS) ×1 IMPLANT
GLOVE BIO SURGEON STRL SZ7.5 (GLOVE) ×1 IMPLANT
GLOVE BIOGEL PI IND STRL 8 (GLOVE) ×1 IMPLANT
GOWN STRL REUS W/ TWL XL LVL3 (GOWN DISPOSABLE) ×1 IMPLANT
KIT BASIN OR (CUSTOM PROCEDURE TRAY) ×1 IMPLANT
NDL HYPO 25X1 1.5 SAFETY (NEEDLE) IMPLANT
NEEDLE HYPO 25X1 1.5 SAFETY (NEEDLE) ×1
NS IRRIG 1000ML POUR BTL (IV SOLUTION) ×1 IMPLANT
PACK ORTHO EXTREMITY (CUSTOM PROCEDURE TRAY) ×1 IMPLANT
PAD CAST 4YDX4 CTTN HI CHSV (CAST SUPPLIES) ×1 IMPLANT
SUT VIC AB 2-0 CT3 27 (SUTURE) IMPLANT
SUT VICRYL RAPIDE 4/0 PS 2 (SUTURE) ×1 IMPLANT
SYR 10ML LL (SYRINGE) IMPLANT
TOWEL GREEN STERILE FF (TOWEL DISPOSABLE) ×1 IMPLANT
UNDERPAD 30X36 HEAVY ABSORB (UNDERPADS AND DIAPERS) ×1 IMPLANT

## 2023-08-09 NOTE — Anesthesia Postprocedure Evaluation (Signed)
Anesthesia Post Note  Patient: Renee Steele  Procedure(s) Performed: RELEASE TRIGGER THUMB RELEASE (Right: Hand)     Patient location during evaluation: PACU Anesthesia Type: MAC Level of consciousness: awake and alert Pain management: pain level controlled Vital Signs Assessment: post-procedure vital signs reviewed and stable Respiratory status: spontaneous breathing, nonlabored ventilation and respiratory function stable Cardiovascular status: stable and blood pressure returned to baseline Postop Assessment: no apparent nausea or vomiting Anesthetic complications: no   No notable events documented.  Last Vitals:  Vitals:   08/09/23 0830 08/09/23 0845  BP: 121/72   Pulse: 90   Resp: (!) 24   Temp:  36.7 C  SpO2: 90%     Last Pain:  Vitals:   08/09/23 0845  TempSrc:   PainSc: 0-No pain                 Collene Schlichter

## 2023-08-09 NOTE — Anesthesia Procedure Notes (Signed)
Date/Time: 08/09/2023 7:30 AM  Performed by: Shary Decamp, CRNAPre-anesthesia Checklist: Patient identified, Emergency Drugs available, Suction available, Timeout performed and Patient being monitored Patient Re-evaluated:Patient Re-evaluated prior to induction Oxygen Delivery Method: Simple face mask

## 2023-08-09 NOTE — Op Note (Signed)
08/09/2023  7:18 AM  PATIENT:  Renee Steele  43 y.o. female  PRE-OPERATIVE DIAGNOSIS:  RIGHT THUMB TRIGGER DIGIT  POST-OPERATIVE DIAGNOSIS:  Same  PROCEDURE:  Procedure(s): RELEASE TRIGGER THUMB RELEASE  SURGEON:  Surgeon(s): Mack Hook, MD  PHYSICIAN ASSISTANT: Danielle Rankin, OPA-C  ANESTHESIA:  Local / MAC  SPECIMENS:  None  DRAINS:   None  EBL:   <19mL  PREOPERATIVE INDICATIONS:  EMMRY HINSCH is a  43 y.o. female with a diagnosis of RIGHT THUMB TRIGGER DIGIT who failed conservative measures and elected for surgical management.    The risks benefits and alternatives were discussed with the patient preoperatively including but not limited to the risks of infection, bleeding, nerve injury, cardiopulmonary complications, the need for revision surgery, among others, and the patient verbalized understanding and consented to proceed.  OPERATIVE IMPLANTS: None  OPERATIVE FINDINGS: See Below  OPERATIVE PROCEDURE:  After receiving prophylactic antibiotics, the patient was escorted to the operative theatre and placed in a supine position.   A surgical "time-out" was performed during which the planned procedure, proposed operative site, and the correct patient identity were compared to the operative consent and agreement confirmed by the circulating nurse according to current facility policy.  Digital block was performed with lidocaine bearing epinephrine. Following application of a tourniquet to the operative extremity, the exposed skin was prepped with Chloraprep and draped in the usual sterile fashion.  The limb was exsanguinated with an Esmarch bandage and the tourniquet inflated to approximately higher than systolic BP.   An transverse incision was made over the A1 pulley of the affected digit.  Subcutaneous taste tissues were dissected with blunt and spreading dissection to reveal an underlying flexor tendon sheath and A1 pulley.  With the neurovascular structures  protected, the A1 pulley was split in the midline under direct visualization with loupe assistance.  Some crossing bands proximal to the A1 pulley were also released.  The FPL pulled into view, cleaned of thickened synovium and return to its bed.  The wound was irrigated, tourniquet released, and skin closed with 4-0 Vicryl Rapide.  A light dressing was applied and the patient was taken to the recovery room.   DISPOSITION: The patient will be discharged home today with typical instructions, returning in 10-15 days.

## 2023-08-09 NOTE — Interval H&P Note (Signed)
History and Physical Interval Note:  08/09/2023 7:17 AM  Renee Steele  has presented today for surgery, with the diagnosis of RIGHT THUMB TRIGGER DIGIT.  The various methods of treatment have been discussed with the patient and family. After consideration of risks, benefits and other options for treatment, the patient has consented to  Procedure(s): RELEASE TRIGGER THUMB RELEASE (Right) as a surgical intervention.  The patient's history has been reviewed, patient examined, no change in status, stable for surgery.  I have reviewed the patient's chart and labs.  Questions were answered to the patient's satisfaction.     Jodi Marble

## 2023-08-09 NOTE — Discharge Instructions (Signed)
Discharge Instructions   You have a light dressing on your hand.  You may begin gentle motion of your fingers and hand immediately, but you should not do any heavy lifting or gripping.  Elevate your hand to reduce pain & swelling of the digits.  Ice over the operative site may be helpful to reduce pain & swelling.  DO NOT USE HEAT. Pain medicine has been prescribed for you.  Take Tylenol 650 mg every 6 hours. Tramadol 50 mg may be taken additionally for severe post operative pain as a rescue medicine. Leave the dressing in place until the third day after your surgery and then remove it, leaving it open to air.  After the bandage has been removed you may shower, regularly washing the incision and letting the water run over it, but not submerging it (no swimming, soaking it in dishwater, etc.) You may drive a car when you are off of prescription pain medications and can safely control your vehicle with both hands. We will address whether therapy will be required or not when you return to the office. You may have already made your follow-up appointment when we completed your preop visit.  If not, please call our office today or the next business day to make your return appointment for 10-15 days after surgery.   Please call [phone] during normal business hours or [phone] after hours for any problems. Including the following:  - excessive redness of the incisions - drainage for more than 4 days - fever of more than 101.5 F  *Please note that pain medications will not be refilled after hours or on weekends.

## 2023-08-09 NOTE — Transfer of Care (Signed)
Immediate Anesthesia Transfer of Care Note  Patient: Renee Steele  Procedure(s) Performed: RELEASE TRIGGER THUMB RELEASE (Right: Hand)  Patient Location: PACU  Anesthesia Type:MAC  Level of Consciousness: awake, alert , oriented, patient cooperative, and responds to stimulation  Airway & Oxygen Therapy: Patient Spontanous Breathing and Patient connected to face mask oxygen  Post-op Assessment: Report given to RN, Post -op Vital signs reviewed and stable, and Patient moving all extremities X 4  Post vital signs: Reviewed and stable  Last Vitals:  Vitals Value Taken Time  BP 122/63 08/09/23 0809  Temp    Pulse 95 08/09/23 0814  Resp 26 08/09/23 0814  SpO2 91 % 08/09/23 0814  Vitals shown include unfiled device data.  Last Pain:  Vitals:   08/09/23 0633  TempSrc:   PainSc: 5          Complications: No notable events documented.

## 2023-08-10 ENCOUNTER — Encounter (HOSPITAL_COMMUNITY): Payer: Self-pay | Admitting: Orthopedic Surgery

## 2023-10-28 ENCOUNTER — Ambulatory Visit: Payer: Medicare HMO | Admitting: Dermatology

## 2023-11-17 ENCOUNTER — Ambulatory Visit (INDEPENDENT_AMBULATORY_CARE_PROVIDER_SITE_OTHER): Admitting: Dermatology

## 2023-11-17 ENCOUNTER — Encounter: Payer: Self-pay | Admitting: Dermatology

## 2023-11-17 VITALS — BP 178/113

## 2023-11-17 DIAGNOSIS — Z1283 Encounter for screening for malignant neoplasm of skin: Secondary | ICD-10-CM

## 2023-11-17 DIAGNOSIS — D1801 Hemangioma of skin and subcutaneous tissue: Secondary | ICD-10-CM | POA: Diagnosis not present

## 2023-11-17 DIAGNOSIS — L814 Other melanin hyperpigmentation: Secondary | ICD-10-CM

## 2023-11-17 DIAGNOSIS — D225 Melanocytic nevi of trunk: Secondary | ICD-10-CM

## 2023-11-17 DIAGNOSIS — L308 Other specified dermatitis: Secondary | ICD-10-CM

## 2023-11-17 DIAGNOSIS — D229 Melanocytic nevi, unspecified: Secondary | ICD-10-CM

## 2023-11-17 DIAGNOSIS — D492 Neoplasm of unspecified behavior of bone, soft tissue, and skin: Secondary | ICD-10-CM

## 2023-11-17 DIAGNOSIS — Z139 Encounter for screening, unspecified: Secondary | ICD-10-CM

## 2023-11-17 DIAGNOSIS — L304 Erythema intertrigo: Secondary | ICD-10-CM | POA: Diagnosis not present

## 2023-11-17 DIAGNOSIS — D485 Neoplasm of uncertain behavior of skin: Secondary | ICD-10-CM

## 2023-11-17 DIAGNOSIS — D239 Other benign neoplasm of skin, unspecified: Secondary | ICD-10-CM

## 2023-11-17 DIAGNOSIS — L821 Other seborrheic keratosis: Secondary | ICD-10-CM

## 2023-11-17 DIAGNOSIS — L918 Other hypertrophic disorders of the skin: Secondary | ICD-10-CM

## 2023-11-17 MED ORDER — NYSTATIN-TRIAMCINOLONE 100000-0.1 UNIT/GM-% EX OINT: TOPICAL_OINTMENT | CUTANEOUS | 5 refills | Status: DC

## 2023-11-17 NOTE — Patient Instructions (Addendum)
 Hello Renee Steele,  Thank you for visiting today. 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  Warm regards,  Dr. Louana Roup, Dermatology         Patient Handout: Wound Care for Skin Biopsy Site  Taking Care of Your Skin Biopsy Site  Proper care of the biopsy site is essential for promoting healing and minimizing scarring. This handout provides instructions on how to care for your biopsy site to ensure optimal recovery.  1. Cleaning the Wound:  Clean the biopsy site daily with gentle soap and water. Gently pat the area dry with a clean, soft towel. Avoid harsh scrubbing or rubbing the area, as this can irritate the skin and delay healing.  2. Applying Aquaphor and Bandage:  After cleaning the wound, apply a thin layer of Aquaphor ointment to the biopsy site. Cover the area with a sterile bandage to protect it from dirt, bacteria, and friction. Change the bandage daily or as needed if it becomes soiled or wet.  3. Continued Care for One Week:  Repeat the cleaning, Aquaphor application, and bandaging process daily for one week following the biopsy procedure. Keeping the wound clean and moist during this initial healing period will help prevent infection and promote optimal  healing.  4. Massaging Aquaphor into the Area:  ---After one week, discontinue the use of bandages but continue to apply Aquaphor to the biopsy site. ----Gently massage the Aquaphor into the area using circular motions. ---Massaging the skin helps to promote circulation and prevent the formation of scar tissue.   Additional Tips:  Avoid exposing the biopsy site to direct sunlight during the healing process, as this can cause hyperpigmentation or worsen scarring. If you experience any signs of infection, such as increased redness, swelling, warmth, or drainage from the wound, contact your healthcare provider immediately. Follow any additional instructions provided by your healthcare provider for caring for the biopsy site and managing any discomfort. Conclusion:  Taking proper care of your skin biopsy site is crucial for ensuring optimal healing and minimizing scarring. By following these instructions for cleaning, applying Aquaphor, and massaging the area, you can promote a smooth and successful recovery. If you have any questions or concerns about caring for your biopsy site, don't hesitate to contact your healthcare provider for guidance.     Important Information  Due to recent changes in healthcare laws, you may see results of your pathology and/or laboratory studies on MyChart before the doctors have had a chance to review them. We understand that in some cases there may be results that are confusing or concerning to you. Please understand that not all results are received at the same  time and often the doctors may need to interpret multiple results in order to provide you with the best plan of care or course of treatment. Therefore, we ask that you please give us  2 business days to thoroughly review all your results before contacting the office for clarification. Should we see a critical lab result, you will be contacted sooner.   If You Need Anything After Your Visit  If you have any  questions or concerns for your doctor, please call our main line at [phone] If no one answers, please leave a voicemail as directed and we will return your call as soon as possible. Messages left after 4 pm will be answered the following business day.   You may also send us  a message via MyChart. We typically respond to MyChart messages within 1-2 business days.  For prescription refills, please ask your pharmacy to contact our office. Our fax number is [phone].  If you have an urgent issue when the clinic is closed that cannot wait until the next business day, you can page your doctor at the number below.    Please note that while we do our best to be available for urgent issues outside of office hours, we are not available 24/7.   If you have an urgent issue and are unable to reach us , you may choose to seek medical care at your doctor's office, retail clinic, urgent care center, or emergency room.  If you have a medical emergency, please immediately call 911 or go to the emergency department. In the event of inclement weather, please call our main line at [phone] for an update on the status of any delays or closures.  Dermatology Medication Tips: Please keep the boxes that topical medications come in in order to help keep track of the instructions about where and how to use these. Pharmacies typically print the medication instructions only on the boxes and not directly on the medication tubes.   If your medication is too expensive, please contact our office at [phone] or send us  a message through MyChart.   We are unable to tell what your co-pay for medications will be in advance as this is different depending on your insurance coverage. However, we may be able to find a substitute medication at lower cost or fill out paperwork to get insurance to cover a needed medication.   If a prior authorization is required to get your medication covered by your insurance company,  please allow us  1-2 business days to complete this process.  Drug prices often vary depending on where the prescription is filled and some pharmacies may offer cheaper prices.  The website www.goodrx.com contains coupons for medications through different pharmacies. The prices here do not account for what the cost may be with help from insurance (it may be cheaper with your insurance), but the website can give you the price if you did not use any insurance.  - You can print the associated coupon and take it with your prescription to the pharmacy.  - You may also stop by our office during regular business hours and pick up a GoodRx coupon card.  - If you need your prescription sent electronically to a different pharmacy, notify our office through [facility] or by phone at [phone]

## 2023-11-17 NOTE — Progress Notes (Signed)
 New Patient Visit   Subjective  Renee Steele is a 43 y.o. female who presents for the following: New Pt - TBSE  Patient states she has moles scattered across body that she would like to have examined. Patient reports the areas have been there for 5 years. She reports the areas are bothersome. Patient rates irritation 3 out of 10. She states that the areas have not spread. Patient reports she has previously been treated for these areas and received referral to specialist. Rx metronidazole  for face for rosacea. Patient denied Hx of bx. Patient denied family history of skin cancer(s).  The following portions of the chart were reviewed this encounter and updated as appropriate: medications, allergies, medical history  Review of Systems:  No other skin or systemic complaints except as noted in HPI or Assessment and Plan.  Objective  Well appearing patient in no apparent distress; mood and affect are within normal limits.   A focused examination was performed of the following areas: scattered    Relevant exam findings are noted in the Assessment and Plan.                 Right Flank 1cm brown papule with irritation   Assessment & Plan   LENTIGINES, SEBORRHEIC KERATOSES, HEMANGIOMAS - Benign normal skin lesions - Benign-appearing - Call for any changes  BENIGN MELANOCYTIC NEVI - Tan-brown and/or pink-flesh-colored symmetric macules and papules - Benign appearing on exam today - Observation - Call clinic for new or changing moles - Recommend daily use of broad spectrum spf 30+ sunscreen to sun-exposed areas.    - Recommend daily broad spectrum sunscreen SPF 30+ to sun-exposed areas, reapply every 2 hours as needed.  - Staying in the shade or wearing long sleeves, sun glasses (UVA+UVB protection) and wide brim hats (4-inch brim around the entire circumference of the hat) are also recommended for sun protection.  - Call for new or changing lesions.  Acrochordons (Skin  Tags) - Fleshy, skin-colored pedunculated papules at R axilla - Benign appearing.  - Observe. - If desired, they can be removed with an in office procedure that is not covered by insurance. - Please call the clinic if you notice any new or changing lesions.  DERMATOFIBROMA Exam: Firm pink/brown papulenodule with dimple sign. Treatment Plan: A dermatofibroma is a benign growth possibly related to trauma, such as an insect bite, cut from shaving, or inflamed acne-type bump.  Treatment options to remove include shave or excision with resulting scar and risk of recurrence.  Since benign-appearing and not bothersome, will observe for now.   INTERTRIGO Exam: Erythematous macerated patches in body folds  flared  Intertrigo is a chronic recurrent rash that occurs in skin fold areas that may be associated with friction; heat; moisture; yeast; fungus; and bacteria.  It is exacerbated by increased movement / activity; sweating; and higher atmospheric temperature.  Use of an absorbant powder such as Zeasorb AF powder or other OTC antifungal powder to the area daily can prevent rash recurrence. Other options to help keep the area dry include blow drying the area after bathing or using antiperspirant products such as Duradry sweat minimizing gel.  Treatment Plan: - Recommended ZeasorbAF powder to apply daily to skin folds. Pic included in AVS. - Rx Nystatin-TMC - Apply to affected ares & skin folds for 7 days, then break for 7 days. Repeat PRN.    POSSIBLE SEBORRHEIC DERMATITIS w/ FACIAL ERYTHEMA   Exam: Pink patches with greasy scale at face  flared  Treatment Plan: - Ordered ANA w/ reflex to r/o lupus - Rx Nystatin-TMC - Apply to affected ares & skin folds for 7 days, then break for 7 days. Repeat PRN.    SKIN CANCER SCREENING PERFORMED TODAY  NEOPLASM OF UNCERTAIN BEHAVIOR OF SKIN Right Flank Skin / nail biopsy Type of biopsy: tangential   Informed consent: discussed and consent  obtained   Timeout: patient name, date of birth, surgical site, and procedure verified   Procedure prep:  Patient was prepped and draped in usual sterile fashion Prep type:  Isopropyl alcohol Anesthesia: the lesion was anesthetized in a standard fashion   Anesthetic:  1% lidocaine  w/ epinephrine 1-100,000 buffered w/ 8.4% NaHCO3 Instrument used: DermaBlade   Hemostasis achieved with: aluminum chloride   Outcome: patient tolerated procedure well   Post-procedure details: sterile dressing applied and wound care instructions given   Dressing type: petrolatum gauze and bandage    No follow-ups on file.  Documentation: I have reviewed the above documentation for accuracy and completeness, and I agree with the above.  I, Shirron Louanne Roussel, CMA, am acting as scribe for [employer], DO.   Louana Roup, DO

## 2023-11-19 LAB — SURGICAL PATHOLOGY

## 2023-11-22 ENCOUNTER — Ambulatory Visit: Payer: Self-pay | Admitting: Dermatology

## 2023-11-22 NOTE — Progress Notes (Signed)
 Hi Renee Steele  Dr. Myrtie Atkinson reviewed your biopsy results and they showed the spot removed was benign (not cancerous).  No additional treatment is required.  The detailed report is available to view in MyChart.  Have a great day!  Kind Regards,  Dr. Christiane Cowing Care Team

## 2023-11-23 ENCOUNTER — Telehealth: Payer: Self-pay | Admitting: Dermatology

## 2023-11-23 NOTE — Telephone Encounter (Signed)
 Patient called this morning concerned about a spot she found and wanted advice on it.  I called and lvm letting her know she would need to make an appointment to go over any new spots of concern as we are not able to make a diagnosis over the phone or without seeing it in person.

## 2023-11-25 ENCOUNTER — Ambulatory Visit (INDEPENDENT_AMBULATORY_CARE_PROVIDER_SITE_OTHER)

## 2023-11-25 ENCOUNTER — Ambulatory Visit (INDEPENDENT_AMBULATORY_CARE_PROVIDER_SITE_OTHER): Admitting: Podiatry

## 2023-11-25 DIAGNOSIS — M722 Plantar fascial fibromatosis: Secondary | ICD-10-CM | POA: Diagnosis not present

## 2023-11-25 DIAGNOSIS — L819 Disorder of pigmentation, unspecified: Secondary | ICD-10-CM

## 2023-11-25 LAB — ANA W/REFLEX: ANA Titer 1: NEGATIVE

## 2023-11-25 NOTE — Patient Instructions (Signed)
 For instructions on how to put on your Plantar Fascial Brace, please visit [email or web address]   Plantar Fasciitis (Heel Spur Syndrome) with Rehab The plantar fascia is a fibrous, ligament-like, soft-tissue structure that spans the bottom of the foot. Plantar fasciitis is a condition that causes pain in the foot due to inflammation of the tissue. SYMPTOMS   Pain and tenderness on the underneath side of the foot.  Pain that worsens with standing or walking. CAUSES  Plantar fasciitis is caused by irritation and injury to the plantar fascia on the underneath side of the foot. Common mechanisms of injury include:  Direct trauma to bottom of the foot.  Damage to a small nerve that runs under the foot where the main fascia attaches to the heel bone.  Stress placed on the plantar fascia due to bone spurs. RISK INCREASES WITH:   Activities that place stress on the plantar fascia (running, jumping, pivoting, or cutting).  Poor strength and flexibility.  Improperly fitted shoes.  Tight calf muscles.  Flat feet.  Failure to warm-up properly before activity.  Obesity. PREVENTION  Warm up and stretch properly before activity.  Allow for adequate recovery between workouts.  Maintain physical fitness:  Strength, flexibility, and endurance.  Cardiovascular fitness.  Maintain a health body weight.  Avoid stress on the plantar fascia.  Wear properly fitted shoes, including arch supports for individuals who have flat feet.  PROGNOSIS  If treated properly, then the symptoms of plantar fasciitis usually resolve without surgery. However, occasionally surgery is necessary.  RELATED COMPLICATIONS   Recurrent symptoms that may result in a chronic condition.  Problems of the lower back that are caused by compensating for the injury, such as limping.  Pain or weakness of the foot during push-off following surgery.  Chronic inflammation, scarring, and partial or complete  fascia tear, occurring more often from repeated injections.  TREATMENT  Treatment initially involves the use of ice and medication to help reduce pain and inflammation. The use of strengthening and stretching exercises may help reduce pain with activity, especially stretches of the Achilles tendon. These exercises may be performed at home or with a therapist. Your caregiver may recommend that you use heel cups of arch supports to help reduce stress on the plantar fascia. Occasionally, corticosteroid injections are given to reduce inflammation. If symptoms persist for greater than 6 months despite non-surgical (conservative), then surgery may be recommended.   MEDICATION   If pain medication is necessary, then nonsteroidal anti-inflammatory medications, such as aspirin and ibuprofen, or other minor pain relievers, such as acetaminophen, are often recommended.  Do not take pain medication within 7 days before surgery.  Prescription pain relievers may be given if deemed necessary by your caregiver. Use only as directed and only as much as you need.  Corticosteroid injections may be given by your caregiver. These injections should be reserved for the most serious cases, because they may only be given a certain number of times.  HEAT AND COLD  Cold treatment (icing) relieves pain and reduces inflammation. Cold treatment should be applied for 10 to 15 minutes every 2 to 3 hours for inflammation and pain and immediately after any activity that aggravates your symptoms. Use ice packs or massage the area with a piece of ice (ice massage).  Heat treatment may be used prior to performing the stretching and strengthening activities prescribed by your caregiver, physical therapist, or athletic trainer. Use a heat pack or soak the injury in warm water.  SEEK IMMEDIATE MEDICAL  CARE IF:  Treatment seems to offer no benefit, or the condition worsens.  Any medications produce adverse side effects.   EXERCISES- RANGE OF MOTION (ROM) AND STRETCHING EXERCISES - Plantar Fasciitis (Heel Spur Syndrome) These exercises may help you when beginning to rehabilitate your injury. Your symptoms may resolve with or without further involvement from your physician, physical therapist or athletic trainer. While completing these exercises, remember:   Restoring tissue flexibility helps normal motion to return to the joints. This allows healthier, less painful movement and activity.  An effective stretch should be held for at least 30 seconds.  A stretch should never be painful. You should only feel a gentle lengthening or release in the stretched tissue.  RANGE OF MOTION - Toe Extension, Flexion  Sit with your right / left leg crossed over your opposite knee.  Grasp your toes and gently pull them back toward the top of your foot. You should feel a stretch on the bottom of your toes and/or foot.  Hold this stretch for 10 seconds.  Now, gently pull your toes toward the bottom of your foot. You should feel a stretch on the top of your toes and or foot.  Hold this stretch for 10 seconds. Repeat  times. Complete this stretch 3 times per day.   RANGE OF MOTION - Ankle Dorsiflexion, Active Assisted  Remove shoes and sit on a chair that is preferably not on a carpeted surface.  Place right / left foot under knee. Extend your opposite leg for support.  Keeping your heel down, slide your right / left foot back toward the chair until you feel a stretch at your ankle or calf. If you do not feel a stretch, slide your bottom forward to the edge of the chair, while still keeping your heel down.  Hold this stretch for 10 seconds. Repeat 3 times. Complete this stretch 2 times per day.   STRETCH  Gastroc, Standing  Place hands on wall.  Extend right / left leg, keeping the front knee somewhat bent.  Slightly point your toes inward on your back foot.  Keeping your right / left heel on the floor and your  knee straight, shift your weight toward the wall, not allowing your back to arch.  You should feel a gentle stretch in the right / left calf. Hold this position for 10 seconds. Repeat 3 times. Complete this stretch 2 times per day.  STRETCH  Soleus, Standing  Place hands on wall.  Extend right / left leg, keeping the other knee somewhat bent.  Slightly point your toes inward on your back foot.  Keep your right / left heel on the floor, bend your back knee, and slightly shift your weight over the back leg so that you feel a gentle stretch deep in your back calf.  Hold this position for 10 seconds. Repeat 3 times. Complete this stretch 2 times per day.  STRETCH  Gastrocsoleus, Standing  Note: This exercise can place a lot of stress on your foot and ankle. Please complete this exercise only if specifically instructed by your caregiver.   Place the ball of your right / left foot on a step, keeping your other foot firmly on the same step.  Hold on to the wall or a rail for balance.  Slowly lift your other foot, allowing your body weight to press your heel down over the edge of the step.  You should feel a stretch in your right / left calf.  Hold this  position for 10 seconds.  Repeat this exercise with a slight bend in your right / left knee. Repeat 3 times. Complete this stretch 2 times per day.   STRENGTHENING EXERCISES - Plantar Fasciitis (Heel Spur Syndrome)  These exercises may help you when beginning to rehabilitate your injury. They may resolve your symptoms with or without further involvement from your physician, physical therapist or athletic trainer. While completing these exercises, remember:   Muscles can gain both the endurance and the strength needed for everyday activities through controlled exercises.  Complete these exercises as instructed by your physician, physical therapist or athletic trainer. Progress the resistance and repetitions only as guided.  STRENGTH -  Towel Curls  Sit in a chair positioned on a non-carpeted surface.  Place your foot on a towel, keeping your heel on the floor.  Pull the towel toward your heel by only curling your toes. Keep your heel on the floor. Repeat 3 times. Complete this exercise 2 times per day.  STRENGTH - Ankle Inversion  Secure one end of a rubber exercise band/tubing to a fixed object (table, pole). Loop the other end around your foot just before your toes.  Place your fists between your knees. This will focus your strengthening at your ankle.  Slowly, pull your big toe up and in, making sure the band/tubing is positioned to resist the entire motion.  Hold this position for 10 seconds.  Have your muscles resist the band/tubing as it slowly pulls your foot back to the starting position. Repeat 3 times. Complete this exercises 2 times per day.  Document Released: 06/22/2005 Document Revised: 09/14/2011 Document Reviewed: 10/04/2008 [facility] Patient Information 2014 [place], [place].

## 2023-11-29 NOTE — Progress Notes (Signed)
 Subjective:  Patient ID: Renee Steele, female    DOB: August 24, 1980,  MRN: [id number]  Chief Complaint  Patient presents with   Foot Pain    RM#11 right foot pain and also has dark spots on bottom of feet have been present for years.    Discussed the use of AI scribe software for clinical note transcription with the patient, who gave verbal consent to proceed.  History of Present Illness Renee Steele is a 43 year old female who presents with right heel pain persisting for over a year.  She experiences constant right heel pain, described as heavy and like pressure, worsening in the evening or at night, particularly at rest. The pain is localized to the heel and arch area, with occasional numbness or tingling, though not currently. There are no injuries to the foot.  Home treatments include using a frozen water bottle and performing stretches, which have provided some relief. Years ago, she received injections in her foot when the pain was worse.  She has seen Dr. Lydia Sams who performed a biopsy on a spot on her foot, which returned normal results. Recently, she saw a dermatologist who did not express concern about the spots on her feet states.  She has no diabetes and does not take anti-inflammatory medications like ibuprofen .      Objective:    Physical Exam General: AAO x3, NAD  Dermatological: As pictured below there are multiple hyperpigmented lesions present.  There is 1 along the more medial aspect that looks different compared to the others.    No open lesions.  Vascular: Dorsalis Pedis artery and Posterior Tibial artery pedal pulses are 2/4 bilateral with immedate capillary fill time. There is no pain with calf compression, swelling, warmth, erythema.   Neruologic: Grossly intact via light touch bilateral.   Musculoskeletal: Tenderness to palpation along the plantar medial tubercle of the calcaneus at the insertion of plantar fascia on the right foot. There is no pain  along the course of the plantar fascia within the arch of the foot. Plantar fascia appears to be intact. There is no pain with lateral compression of the calcaneus or pain with vibratory sensation. There is no pain along the course or insertion of the achilles tendon. No other areas of tenderness to bilateral lower extremities.  Gait: Unassisted, Nonantalgic.          Results Radiology: 3 views right foot were obtained.  No evidence of acute fracture.  Calcaneal spurring is present.   Assessment:   1. Plantar fasciitis of right foot      Plan:  Patient was evaluated and treated and all questions answered.  Assessment and Plan Assessment & Plan Plantar fasciitis Chronic right foot plantar fasciitis with heel spur. Persistent pain due to plantar fascia inflammation. Previous treatments provided partial relief. Discussed combined treatments for inflammation and underlying issues. Injections remain an option if needed. - Continue ice massage with frozen water bottle. - Provide stretching and massage exercise worksheet. - Recommend plantar fascia brace which is the bedside help support and stabilize the plantar fascia to help facilitate soft tissue healing. - Advise shoes with good arch support, avoid going barefoot. - Provide [employer] coupon for supportive shoes fitting. - Consider short course of steroids or injection if no improvement.  Moles on feet Multiple foot moles, one with color variation. Previous benign biopsy. Recent dermatology evaluation showed no concerns. Continued monitoring advised. - Discussed biopsy of the one lesion.  She would like to get  scheduled for this. - Monitor for changes in size, shape, or color and report changes.   Return for biopsy skin lesion right foot; plantar fasciitis.   Charity Conch DPM

## 2023-12-17 ENCOUNTER — Ambulatory Visit: Admitting: Podiatry

## 2024-01-26 ENCOUNTER — Ambulatory Visit: Admitting: Dermatology

## 2024-01-26 VITALS — BP 127/84

## 2024-01-26 DIAGNOSIS — L304 Erythema intertrigo: Secondary | ICD-10-CM | POA: Diagnosis not present

## 2024-01-26 DIAGNOSIS — L819 Disorder of pigmentation, unspecified: Secondary | ICD-10-CM | POA: Diagnosis not present

## 2024-01-26 DIAGNOSIS — L309 Dermatitis, unspecified: Secondary | ICD-10-CM

## 2024-01-26 DIAGNOSIS — L299 Pruritus, unspecified: Secondary | ICD-10-CM

## 2024-01-26 DIAGNOSIS — D171 Benign lipomatous neoplasm of skin and subcutaneous tissue of trunk: Secondary | ICD-10-CM

## 2024-01-26 MED ORDER — KETOCONAZOLE 2 % EX CREA: 1.0000 | TOPICAL_CREAM | Freq: Every day | CUTANEOUS | 1 refills | Status: DC

## 2024-01-26 NOTE — Patient Instructions (Addendum)
 Date: Wed Jan 26 2024  Hello Renee Steele,  Thank you for visiting today. Here is a summary of the key instructions:  Medications: - Apply ketoconazole  cream to light patches on arms twice daily for 6 weeks - Apply ketoconazole  cream to red and irritated area above bump twice daily for 6 weeks  Skin Care: - Apply Cicalfate healing balm by Avene on top of ketoconazole  cream on the red and irritated area above bump - Apply Cicalfate healing balm to itchy area on face:   - Thick layer at night   - Thin layer in the morning - Wear mineral sunscreen daily  Follow-up: - Call the office if face itching doesn't improve after 6 weeks  Other Instructions: - Get some sun exposure on light patches on arms if desired, but avoid excessive sun exposure - Cicalfate can be purchased at [employer] or [employer] if you like the samples provided  We look forward to seeing you at your next visit. If you have any questions or concerns before then, please do not hesitate to contact our office.  Warm regards,  Dr. [facility]  RECOMMENDED SUNSCREEN      Important Information  Due to recent changes in healthcare laws, you may see results of your pathology and/or laboratory studies on MyChart before the doctors have had a chance to review them. We understand that in some cases there may be results that are confusing or concerning to you. Please understand that not all results are received at the same time and often the doctors may need to interpret multiple results in order to provide you with the best plan of care or course of treatment. Therefore, we ask that you please give us  2 business days to thoroughly review all your results before contacting the office for clarification. Should we see a critical lab result, you will be contacted sooner.   If You Need Anything After Your Visit  If you have any questions or concerns for your doctor, please call our main line at [phone]  If no one answers, please leave a voicemail as directed and we will return your call as soon as possible. Messages left after 4 pm will be answered the following business day.   You may also send us  a message via MyChart. We typically respond to MyChart messages within 1-2 business days.  For prescription refills, please ask your pharmacy to contact our office. Our fax number is [phone].  If you have an urgent issue when the clinic is closed that cannot wait until the next business day, you can page your doctor at the number below.    Please note that while we do our best to be available for urgent issues outside of office hours, we are not available 24/7.   If you have an urgent issue and are unable to reach us , you may choose to seek medical care at your doctor's office, retail clinic, urgent care center, or emergency room.  If you have a medical emergency, please immediately call 911 or go to the emergency department. In the event of inclement weather, please call our main line at [phone] for an update on the status of any delays or closures.  Dermatology Medication Tips: Please keep the boxes that topical medications come in in order to help keep track of the instructions about where and how to use these. Pharmacies typically print the medication instructions only on the boxes and not directly on the medication tubes.   If  your medication is too expensive, please contact our office at [phone] or send us  a message through MyChart.   We are unable to tell what your co-pay for medications will be in advance as this is different depending on your insurance coverage. However, we may be able to find a substitute medication at lower cost or fill out paperwork to get insurance to cover a needed medication.   If a prior authorization is required to get your medication covered by your insurance company, please allow us  1-2 business days to complete this process.  Drug prices often  vary depending on where the prescription is filled and some pharmacies may offer cheaper prices.  The website www.goodrx.com contains coupons for medications through different pharmacies. The prices here do not account for what the cost may be with help from insurance (it may be cheaper with your insurance), but the website can give you the price if you did not use any insurance.  - You can print the associated coupon and take it with your prescription to the pharmacy.  - You may also stop by our office during regular business hours and pick up a GoodRx coupon card.  - If you need your prescription sent electronically to a different pharmacy, notify our office through [facility] or by phone at [phone]

## 2024-01-26 NOTE — Progress Notes (Signed)
   Follow-Up Visit   Subjective  Renee Steele is a 43 y.o. female who presents for the following: Rash of arms x months. It is not itchy or symptomatic. It is just discolored. It was there at her last visit but she forgot to ask about it. She also has a mole on the right lower abdomen that gets irritated.   The following portions of the chart were reviewed this encounter and updated as appropriate: medications, allergies, medical history  Review of Systems:  No other skin or systemic complaints except as noted in HPI or Assessment and Plan.  Objective  Well appearing patient in no apparent distress; mood and affect are within normal limits.   A focused examination was performed of the following areas: Arms   Relevant exam findings are noted in the Assessment and Plan.            Assessment & Plan   1. Hypopigmented patches on arms - Assessment: Lighter patches on arms present for an extended period. Appear to be tan lines rather than a pathological condition. Some irritation noted, possibly due to intertrigo, an overgrowth of yeast on the skin that can exaggerate hypopigmentation. - Plan:    Apply ketoconazole  cream to affected areas, morning and night for 6 weeks    Advised patient to get mild sun exposure if the discoloration is bothersome, but emphasized that it is not dangerous    Follow up if condition persists or worsens after treatment period  2. Intertrigo under breasts - Assessment: Patient reports having intertrigo under her breasts, consistent with overgrowth of yeast causing redness and irritation in intertriginous areas. - Plan:    Discontinue current treatment ([facility])    Apply ketoconazole  cream to affected area under breasts    Follow up if condition persists or worsens after treatment period  3. Growth with surrounding irritation - Assessment: Growth previously examined, now with redness and irritation above it. Growth appears benign, possibly a  neurofibroma or lipoma. Surrounding irritation likely related to intertrigo rather than the growth itself. - Plan:    Apply ketoconazole  cream to the irritated area surrounding the growth    Apply Avene Cicalfate healing balm on top of the ketoconazole  cream    Provided samples of Avene Cicalfate healing balm    Advised against surgical intervention due to risk of infection    Follow up if condition persists or worsens  4. Facial itching and inflammation - Assessment: Patient reports facial itching, particularly in one area. Examination reveals inflammation, possibly exacerbated by scratching. - Plan:    Apply Avene Cicalfate healing balm to affected area nightly (thick layer) and in the morning (thin layer)    Recommended daily use of mineral sunscreen    Follow up in 6 weeks if condition does not improve     No follow-ups on file.  I, Roseline Hutchinson, CMA, am acting as scribe for [employer], DO .   Documentation: I have reviewed the above documentation for accuracy and completeness, and I agree with the above.  Delon Lenis, DO

## 2024-02-07 ENCOUNTER — Encounter: Payer: Self-pay | Admitting: Dermatology

## 2024-03-20 ENCOUNTER — Encounter: Payer: Self-pay | Admitting: Dermatology

## 2024-04-10 ENCOUNTER — Ambulatory Visit: Admitting: Cardiology

## 2024-04-17 ENCOUNTER — Encounter: Payer: Self-pay | Admitting: Cardiology

## 2024-04-17 ENCOUNTER — Ambulatory Visit

## 2024-04-17 ENCOUNTER — Ambulatory Visit: Attending: Cardiology | Admitting: Cardiology

## 2024-04-17 VITALS — BP 139/94 | HR 91 | Ht 67.0 in | Wt >= 6400 oz

## 2024-04-17 DIAGNOSIS — R002 Palpitations: Secondary | ICD-10-CM | POA: Diagnosis not present

## 2024-04-17 DIAGNOSIS — E781 Pure hyperglyceridemia: Secondary | ICD-10-CM | POA: Diagnosis not present

## 2024-04-17 DIAGNOSIS — E782 Mixed hyperlipidemia: Secondary | ICD-10-CM

## 2024-04-17 DIAGNOSIS — I10 Essential (primary) hypertension: Secondary | ICD-10-CM | POA: Diagnosis not present

## 2024-04-17 DIAGNOSIS — G4730 Sleep apnea, unspecified: Secondary | ICD-10-CM

## 2024-04-17 DIAGNOSIS — Z6841 Body Mass Index (BMI) 40.0 and over, adult: Secondary | ICD-10-CM

## 2024-04-17 DIAGNOSIS — E66813 Obesity, class 3: Secondary | ICD-10-CM

## 2024-04-17 NOTE — Patient Instructions (Signed)
 Medication Instructions:  Your physician recommends that you continue on your current medications as directed. Please refer to the Current Medication list given to you today.  *If you need a refill on your cardiac medications before your next appointment, please call your pharmacy*  Lab Work: Have labs checked by your primary care provider--Lipids, TSH, Hemoglobin If you have labs (blood work) drawn today and your tests are completely normal, you will receive your results only by: MyChart Message (if you have MyChart) OR A paper copy in the mail If you have any lab test that is abnormal or we need to change your treatment, we will call you to review the results.  Testing/Procedures: Dr Michele has requested you wear a 7 day monitor  Follow-Up: At [facility], you and your health needs are our priority.  As part of our continuing mission to provide you with exceptional heart care, our providers are all part of one team.  This team includes your primary Cardiologist (physician) and Advanced Practice Providers or APPs (Physician Assistants and Nurse Practitioners) who all work together to provide you with the care you need, when you need it.  Your next appointment:   As needed  Provider:   Madonna Michele, DO    We recommend signing up for the patient portal called MyChart.  Sign up information is provided on this After Visit Summary.  MyChart is used to connect with patients for Virtual Visits (Telemedicine).  Patients are able to view lab/test results, encounter notes, upcoming appointments, etc.  Non-urgent messages can be sent to your provider as well.   To learn more about what you can do with MyChart, go to [email or web address].   Other Instructions ZIO XT- Long Term Monitor Instructions  Your physician has requested you wear a ZIO patch monitor for 14 days.  This is a single patch monitor. [employer] supplies one patch monitor per enrollment. Additional stickers are not  available. Please do not apply patch if you will be having a Nuclear Stress Test,  Echocardiogram, Cardiac CT, MRI, or Chest Xray during the period you would be wearing the  monitor. The patch cannot be worn during these tests. You cannot remove and re-apply the  ZIO XT patch monitor.  Your ZIO patch monitor will be mailed 3 day USPS to your address on file. It may take 3-5 days  to receive your monitor after you have been enrolled.  Once you have received your monitor, please review the enclosed instructions. Your monitor  has already been registered assigning a specific monitor serial # to you.  Billing and Patient Assistance Program Information  We have supplied [employer] with any of your insurance information on file for billing purposes. [employer] offers a sliding scale Patient Assistance Program for patients that do not have  insurance, or whose insurance does not completely cover the cost of the ZIO monitor.  You must apply for the Patient Assistance Program to qualify for this discounted rate.  To apply, please call [employer] at [phone], select option 4, select option 2, ask to apply for  Patient Assistance Program. Meredeth will ask your household income, and how many people  are in your household. They will quote your out-of-pocket cost based on that information.  [employer] will also be able to set up a 80-month, interest-free payment plan if needed.  Applying the monitor   Shave hair from upper left chest.  Hold abrader disc by orange tab. Rub abrader in 40 strokes over the upper left chest  as  indicated in your monitor instructions.  Clean area with 4 enclosed alcohol pads. Let dry.  Apply patch as indicated in monitor instructions. Patch will be placed under collarbone on left  side of chest with arrow pointing upward.  Rub patch adhesive wings for 2 minutes. Remove white label marked 1. Remove the white  label marked 2. Rub patch adhesive wings for 2 additional minutes.   While looking in a mirror, press and release button in center of patch. A small green light will  flash 3-4 times. This will be your only indicator that the monitor has been turned on.  Do not shower for the first 24 hours. You may shower after the first 24 hours.  Press the button if you feel a symptom. You will hear a small click. Record Date, Time and  Symptom in the Patient Logbook.  When you are ready to remove the patch, follow instructions on the last 2 pages of Patient  Logbook. Stick patch monitor onto the last page of Patient Logbook.  Place Patient Logbook in the blue and white box. Use locking tab on box and tape box closed  securely. The blue and white box has prepaid postage on it. Please place it in the mailbox as  soon as possible. Your physician should have your test results approximately 7 days after the  monitor has been mailed back to [facility].  Call [facility] Customer Care at [phone] if you have questions regarding  your ZIO XT patch monitor. Call them immediately if you see an orange light blinking on your  monitor.  If your monitor falls off in less than 4 days, contact our Monitor department at [phone].  If your monitor becomes loose or falls off after 4 days call [employer] at [phone] for  suggestions on securing your monitor

## 2024-04-17 NOTE — Progress Notes (Signed)
 Cardiology Office Note:  .   Date:  04/17/2024  ID:  Renee Steele, DOB Aug 31, 1980, MRN [id number] PCP:  Campbell Reynolds, NP  Former Cardiology Providers: Dr. Candyce Reek [facility] Providers Cardiologist:  Madonna Large, DO , [facility] (established care 04/17/24) Electrophysiologist:  None  Click to update primary MD,subspecialty MD or APP then REFRESH:1}    Chief Complaint  Patient presents with   Follow-up    Preventative visit & palpitations    History of Present Illness: .   Renee Steele is a 43 y.o. African-American female whose past medical history and cardiovascular risk factors includes: Hypertension, hyperlipidemia, LVH, obesity due to excess calories, status post gastric sleeve, sleep apnea not on CPAP.  Formally under the care of Dr. Candyce Reek who last saw Renee Steele back in March 04, 2022. I am seeing her for the first time to re-establishing care.   Most recent seen in November 2024 by [facility] PA-C, progress note reviewed.  Patient stated she follows with cardiology for preventative care since last office visit has been experiencing palpitations/fluttering in the chest  Palpitations/fluttering: Started a few weeks ago. Intermittent. No precipitating factors. Appreciates skipped beats No prodromal symptoms. Short-lived, 2 to 3 seconds. No near-syncope or syncopal events  Consumes no more than 1 cup of coffee per week. No more than 2 alcoholic beverages per month. No illicit drugs. No recent weight loss supplementation or new over-the-counter medications History of hyperthyroidism and anemia.  Overall functional capacity stable but limited: Walks 10 minutes 3 times per week, close to the [facility] for cardiovascular workouts 15 minutes twice a week   Review of Systems: .   Review of Systems  Cardiovascular:  Positive for palpitations. Negative for chest pain, claudication, irregular heartbeat, leg swelling, near-syncope, orthopnea,  paroxysmal nocturnal dyspnea and syncope.  Respiratory:  Negative for shortness of breath.   Hematologic/Lymphatic: Negative for bleeding problem.    Studies Reviewed:   EKG: EKG Interpretation Date/Time:  Monday April 17 2024 08:50:09 EDT Ventricular Rate:  89 PR Interval:  154 QRS Duration:  84 QT Interval:  372 QTC Calculation: 452 R Axis:   10  Text Interpretation: Normal sinus rhythm Nonspecific T wave abnormality When compared with ECG of 17-May-2023 15:49, No significant change was found Confirmed by Large Madonna [phone]) on 04/17/2024 8:54:51 AM  Echocardiogram: 09/2019 LVEF: 55-60% Diastolic Function: Grade 1 Right ventricle: Size normal, function not well-visualized No significant valvular heart disease Estimated RAP 3 mmHg See report for additional details  RADIOLOGY: NA  Risk Assessment/Calculations:   NA  Labs:       Latest Ref Rng & Units 08/09/2023    6:30 AM 03/02/2022    3:40 PM 02/11/2022    7:54 PM  CBC  WBC 4.0 - 10.5 K/uL 6.4  8.4  8.7   Hemoglobin 12.0 - 15.0 g/dL 86.9  87.0  86.2   Hematocrit 36.0 - 46.0 % 40.6  40.6  42.8   Platelets 150 - 400 K/uL 403  375  401        Latest Ref Rng & Units 08/09/2023    6:30 AM 03/02/2022    3:40 PM 02/11/2022    7:54 PM  BMP  Glucose 70 - 99 mg/dL 884  897  885   BUN 6 - 20 mg/dL 6  9  11    Creatinine 0.44 - 1.00 mg/dL 9.18  9.24  9.30   Sodium 135 - 145 mmol/L 142  141  141  Potassium 3.5 - 5.1 mmol/L 3.4  4.0  4.0   Chloride 98 - 111 mmol/L 108  111  110   CO2 22 - 32 mmol/L 23  23  19    Calcium  8.9 - 10.3 mg/dL 8.7  9.3  9.2       Latest Ref Rng & Units 08/09/2023    6:30 AM 03/02/2022    3:40 PM 02/11/2022    7:54 PM  CMP  Glucose 70 - 99 mg/dL 884  897  885   BUN 6 - 20 mg/dL 6  9  11    Creatinine 0.44 - 1.00 mg/dL 9.18  9.24  9.30   Sodium 135 - 145 mmol/L 142  141  141   Potassium 3.5 - 5.1 mmol/L 3.4  4.0  4.0   Chloride 98 - 111 mmol/L 108  111  110   CO2 22 - 32 mmol/L 23  23  19     Calcium  8.9 - 10.3 mg/dL 8.7  9.3  9.2   Total Protein 6.5 - 8.1 g/dL   7.1   Total Bilirubin 0.3 - 1.2 mg/dL   0.7   Alkaline Phos 38 - 126 U/L   68   AST 15 - 41 U/L   18   ALT 0 - 44 U/L   13     Lab Results  Component Value Date   CHOL 194 02/11/2022   HDL 52 02/11/2022   LDLCALC 105 (H) 02/11/2022   TRIG 183 (H) 02/11/2022   CHOLHDL 3.7 02/11/2022   No results for input(s): LIPOA in the last 8760 hours. No components found for: NTPROBNP No results for input(s): PROBNP in the last 8760 hours. No results for input(s): TSH in the last 8760 hours.  Physical Exam:    Today's Vitals   04/17/24 0834  BP: (!) 139/94  Pulse: 91  SpO2: 98%  Weight: (!) 419 lb 12.8 oz (190.4 kg)  Height: 5' 7 (1.702 m)   Body mass index is 65.75 kg/m. Wt Readings from Last 3 Encounters:  04/17/24 (!) 419 lb 12.8 oz (190.4 kg)  08/09/23 (!) 428 lb (194.1 kg)  05/17/23 (!) 427 lb 3.2 oz (193.8 kg)    Physical Exam  Constitutional: No distress.  hemodynamically stable  Neck: No JVD present.  Cardiovascular: Normal rate, regular rhythm, S1 normal and S2 normal. Exam reveals no gallop, no S3 and no S4.  No murmur heard. Pulmonary/Chest: Effort normal and breath sounds normal. No stridor. She has no wheezes. She has no rales.  Musculoskeletal:        General: No edema.     Cervical back: Neck supple.  Skin: Skin is warm.     Impression & Recommendation(s):  Impression:   ICD-10-CM   1. Essential hypertension  I10 EKG 12-Lead    2. Mixed hyperlipidemia  E78.2     3. Pure hypertriglyceridemia  E78.1     4. Sleep apnea in adult  G47.30     5. Class 3 severe obesity due to excess calories with serious comorbidity and body mass index (BMI) of 60.0 to 69.9 in adult [facility])  Z33.186    Z68.44        Recommendation(s):  Palpitations Likely secondary to underlying PACs/PVCs. Not suggestive of sustained arrhythmias Cardiac monitor to evaluate for dysrhythmias. Recommend  follow-up with PCP to make sure TSH and hemoglobin are within acceptable limits given her history of thyroid  disease and anemia  Essential hypertension Office blood pressures are acceptable but  not at goal. Currently on losartan  25 mg p.o. daily. Currently on amlodipine  10 mg p.o. daily Recommend checking blood pressures at home and to review ambulatory readings with PCP to see if further medication titration is warranted Reemphasized importance of low-salt diet. Re encouraged the importance of treating her sleep apnea  Mixed hyperlipidemia Pure hypertriglyceridemia Currently on Lipitor 20 mg p.o. nightly. Cardiology following peripherally, managed by primary care provider.  Sleep apnea in adult Noncompliant with device therapy due to inconvenience Recommended that she follows up with PCP/sleep medicine to have it readdressed  Class 3 severe obesity due to excess calories with serious comorbidity and body mass index (BMI) of 60.0 to 69.9 in adult [facility]) Body mass index is 65.75 kg/m. I reviewed with her importance of diet, regular physical activity/exercise, weight loss.   Patient is educated on the importance of increasing physical activity gradually as tolerated with a goal of moderate intensity exercise for 30 minutes a day 5 days a week.  His other cardiac monitor results are unremarkable will defer her back to PCP for longitudinal follow-up and she can follow-up with cardiology on as-needed basis.  Patient agreeable with the plan of care.   Orders Placed:  Orders Placed This Encounter  Procedures   EKG 12-Lead     Final Medication List:   No orders of the defined types were placed in this encounter.   There are no discontinued medications.   Current Outpatient Medications:    acetaminophen  (TYLENOL ) 325 MG tablet, Take 2 tablets (650 mg total) by mouth every 6 (six) hours. (Patient taking differently: Take 650 mg by mouth every 6 (six) hours as needed for mild pain (pain  score 1-3) or moderate pain (pain score 4-6).), Disp: , Rfl:    amLODipine  (NORVASC ) 10 MG tablet, Take 1 tablet (10 mg total) by mouth daily. MUST MAKE APPT FOR FURTHER REFILLS, Disp: 30 tablet, Rfl: 0   atorvastatin  (LIPITOR) 20 MG tablet, Take 1 tablet (20 mg total) by mouth daily., Disp: 30 tablet, Rfl: 3   diphenhydramine-acetaminophen  (TYLENOL  PM) 25-500 MG TABS tablet, Take 2 tablets by mouth at bedtime as needed (Headache)., Disp: , Rfl:    DULoxetine (CYMBALTA) 20 MG capsule, Take 20 mg by mouth daily., Disp: , Rfl:    fluticasone (FLONASE) 50 MCG/ACT nasal spray, Place 1 spray into both nostrils daily as needed for allergies., Disp: , Rfl:    levothyroxine  (SYNTHROID , LEVOTHROID) 50 MCG tablet, Take 1 tablet (50 mcg total) by mouth daily., Disp: 30 tablet, Rfl: 5   losartan  (COZAAR ) 25 MG tablet, Take 25 mg by mouth daily., Disp: , Rfl:    medroxyPROGESTERone (PROVERA) 10 MG tablet, Take 20 mg by mouth See admin instructions. Use 12 days a month a month every month, Disp: , Rfl:    MELATONIN GUMMIES PO, Take 2 tablets by mouth daily as needed (Sleep)., Disp: , Rfl:    Multiple Vitamin (MULTIVITAMIN WITH MINERALS) TABS tablet, Take 1 tablet by mouth daily., Disp: , Rfl:    VRAYLAR  3 MG capsule, Take 3 mg by mouth daily., Disp: , Rfl:    ketoconazole  (NIZORAL ) 2 % cream, Apply 1 Application topically daily. Apply to affected areas of body folds daily (Patient not taking: Reported on 04/17/2024), Disp: 60 g, Rfl: 1   metFORMIN  (GLUCOPHAGE ) 500 MG tablet, Take 500 mg by mouth 2 (two) times daily. (Patient not taking: Reported on 04/17/2024), Disp: , Rfl:    metroNIDAZOLE  (METROGEL ) 0.75 % gel, Apply 1 Application topically  daily. (Patient not taking: Reported on 04/17/2024), Disp: , Rfl:    nitrofurantoin , macrocrystal-monohydrate, (MACROBID ) 100 MG capsule, Take 100 mg by mouth 2 (two) times daily. (Patient not taking: Reported on 04/17/2024), Disp: , Rfl:    nystatin -triamcinolone  ointment  (MYCOLOG), Apply to affected areas and skin folds for 7 days, then break for 7 days. Repeat PRN. (Patient not taking: Reported on 04/17/2024), Disp: 60 g, Rfl: 5   OZEMPIC, 0.25 OR 0.5 MG/DOSE, 2 MG/3ML SOPN, Inject into the skin. (Patient not taking: Reported on 04/17/2024), Disp: , Rfl:    traMADol  (ULTRAM ) 50 MG tablet, Take 1 tablet (50 mg total) by mouth every 6 (six) hours as needed (severe postop pain). (Patient not taking: Reported on 04/17/2024), Disp: 10 tablet, Rfl: 0  Consent:   NA  Disposition:   As needed  Her questions and concerns were addressed to her satisfaction. She voices understanding of the recommendations provided during this encounter.    Signed, Madonna Michele HAS, [facility]  A Division of [facility] [address] [place], [place] [address]  04/17/2024 9:07 AM

## 2024-04-17 NOTE — Progress Notes (Unsigned)
 Enrolled for [employer] to mail a ZIO XT long term holter monitor to the patients address on file.

## 2024-04-27 ENCOUNTER — Other Ambulatory Visit: Payer: Self-pay | Admitting: Medical Genetics

## 2024-04-27 DIAGNOSIS — Z006 Encounter for examination for normal comparison and control in clinical research program: Secondary | ICD-10-CM

## 2024-05-16 ENCOUNTER — Telehealth: Payer: Self-pay | Admitting: Cardiology

## 2024-05-16 NOTE — Telephone Encounter (Signed)
Pt calling to f/u on heart monitor results. Please advise

## 2024-05-16 NOTE — Telephone Encounter (Signed)
 Returned patient's call and advised that Dr. Michele has not reviewed results yet, our staff will call with results when available. Patient verbalizes understanding.

## 2024-05-18 ENCOUNTER — Ambulatory Visit: Payer: Self-pay | Admitting: Cardiology

## 2024-05-18 DIAGNOSIS — R002 Palpitations: Secondary | ICD-10-CM

## 2024-05-18 DIAGNOSIS — I471 Supraventricular tachycardia, unspecified: Secondary | ICD-10-CM

## 2024-05-18 NOTE — Telephone Encounter (Signed)
 Pt returned call to FU on Results please advise

## 2024-05-18 NOTE — Telephone Encounter (Signed)
 Will send to Dr. Michele.

## 2024-05-19 NOTE — Telephone Encounter (Signed)
 See the result note for the monitor.   Dr. Nishi Neiswonger

## 2024-06-21 ENCOUNTER — Other Ambulatory Visit (INDEPENDENT_AMBULATORY_CARE_PROVIDER_SITE_OTHER)

## 2024-06-21 DIAGNOSIS — I471 Supraventricular tachycardia, unspecified: Secondary | ICD-10-CM

## 2024-06-21 LAB — ECHOCARDIOGRAM COMPLETE
Area-P 1/2: 5.38 cm2
S' Lateral: 2.09 cm

## 2024-06-21 MED ORDER — PERFLUTREN LIPID MICROSPHERE
1.0000 mL | INTRAVENOUS | Status: AC | PRN
  Administered 2024-06-21: 14:00:00 2 mL via INTRAVENOUS

## 2024-07-03 ENCOUNTER — Ambulatory Visit: Payer: Self-pay | Admitting: Cardiology

## 2024-07-14 ENCOUNTER — Ambulatory Visit: Admitting: Cardiology

## 2024-08-01 ENCOUNTER — Other Ambulatory Visit (HOSPITAL_COMMUNITY): Payer: Self-pay

## 2024-08-01 ENCOUNTER — Encounter: Payer: Self-pay | Admitting: Cardiology

## 2024-08-01 ENCOUNTER — Ambulatory Visit: Attending: Cardiology | Admitting: Cardiology

## 2024-08-01 VITALS — BP 142/91 | HR 98 | Resp 16 | Ht 67.0 in | Wt >= 6400 oz

## 2024-08-01 DIAGNOSIS — R002 Palpitations: Secondary | ICD-10-CM

## 2024-08-01 DIAGNOSIS — Z6841 Body Mass Index (BMI) 40.0 and over, adult: Secondary | ICD-10-CM

## 2024-08-01 DIAGNOSIS — E781 Pure hyperglyceridemia: Secondary | ICD-10-CM | POA: Diagnosis not present

## 2024-08-01 DIAGNOSIS — E782 Mixed hyperlipidemia: Secondary | ICD-10-CM | POA: Diagnosis not present

## 2024-08-01 DIAGNOSIS — I10 Essential (primary) hypertension: Secondary | ICD-10-CM

## 2024-08-01 DIAGNOSIS — E66813 Obesity, class 3: Secondary | ICD-10-CM | POA: Diagnosis not present

## 2024-08-01 DIAGNOSIS — R072 Precordial pain: Secondary | ICD-10-CM

## 2024-08-01 DIAGNOSIS — G4730 Sleep apnea, unspecified: Secondary | ICD-10-CM

## 2024-08-01 MED ORDER — OMRON 3 SERIES BP MONITOR DEVI
1.0000 | Freq: Every day | 0 refills | Status: AC
  Filled 2024-08-01: qty 1, 30d supply, fill #0

## 2024-08-01 NOTE — Patient Instructions (Signed)
 Medication Instructions:  Your physician recommends that you continue on your current medications as directed. Please refer to the Current Medication list given to you today.  *If you need a refill on your cardiac medications before your next appointment, please call your pharmacy*  Lab Work: None ordered If you have labs (blood work) drawn today and your tests are completely normal, you will receive your results only by: MyChart Message (if you have MyChart) OR A paper copy in the mail If you have any lab test that is abnormal or we need to change your treatment, we will call you to review the results.  Testing/Procedures: None ordered  Follow-Up: At [facility], you and your health needs are our priority.  As part of our continuing mission to provide you with exceptional heart care, our providers are all part of one team.  This team includes your primary Cardiologist (physician) and Advanced Practice Providers or APPs (Physician Assistants and Nurse Practitioners) who all work together to provide you with the care you need, when you need it.  Your next appointment:   1 year(s)  Provider:   Madonna Large, DO    We recommend signing up for the patient portal called MyChart.  Sign up information is provided on this After Visit Summary.  MyChart is used to connect with patients for Virtual Visits (Telemedicine).  Patients are able to view lab/test results, encounter notes, upcoming appointments, etc.  Non-urgent messages can be sent to your provider as well.   To learn more about what you can do with MyChart, go to [email or web address].   Other Instructions Please follow up with your PCP for management of your blood pressure.  Your physician has requested that you regularly monitor and record your blood pressure readings at home. Please use the same machine at the same time of day to check your readings and record them to bring to your follow-up visit.   Please monitor blood  pressures and keep a log of your readings.    Make sure to check 2 hours after your medications.    AVOID these things for 30 minutes before checking your blood pressure: No Drinking caffeine . No Drinking alcohol. No Eating. No Smoking. No Exercising.   Five minutes before checking your blood pressure: Pee. Sit in a dining chair. Avoid sitting in a soft couch or armchair. Be quiet. Do not talk

## 2024-08-01 NOTE — Progress Notes (Signed)
 " Cardiology Office Note:  .   Date:  08/01/2024  ID:  Renee Steele, DOB [birth year], MRN [id number] PCP:  Renee Reynolds, NP  Former Cardiology Providers: Dr. Candyce Reek [facility] Providers Cardiologist:  Madonna Large, DO , [facility] (established care 04/17/24) Electrophysiologist:  None  Click to update primary MD,subspecialty MD or APP then REFRESH:1}    Chief Complaint  Patient presents with   Palpitations   Results   Follow-up    Patient profile  Renee Steele is a 44 y.o. African-American female whose past medical history and cardiovascular risk factors includes: Hypertension, hyperlipidemia, obesity due to excess calories, status post gastric sleeve, sleep apnea not on CPAP.  Visits  Formally under the care of Dr. Candyce Reek.  04/2024 re-established care and endorsing palpitations. Recommended echo and cardiac monitor.   History of Present Illness   Palpitations still present notes the fluttering in the chest. No syncope. Thyroid  function was checked by primary team and within normal limits per patient (I dont have those labs from [facility]).   Chest Pain Onset: years Last occurrence: last week.  Location: substernal  Intensity: 6/10 Frequency: once every other week Duration: < 1 second  Improving factors: breathing exercises help  Worsening factors: stress and panic and anxiety  Do symptoms worsen with effort related activities: No Are symptoms self limited: Yes Are symptoms positional / pleuritic: no   Review of Systems: .   Review of Systems  Cardiovascular:  Positive for chest pain and palpitations. Negative for claudication, irregular heartbeat, leg swelling, near-syncope, orthopnea, paroxysmal nocturnal dyspnea and syncope.  Respiratory:  Negative for shortness of breath.   Hematologic/Lymphatic: Negative for bleeding problem.    Studies Reviewed:   Echocardiogram: 09/2019 LVEF: 55-60% Diastolic Function: Grade 1 Right  ventricle: Size normal, function not well-visualized No significant valvular heart disease Estimated RAP 3 mmHg See report for additional details  06/21/24 1. Left ventricular ejection fraction, by estimation, is 60 to 65%. The left ventricle has normal function. The left ventricle has no regional wall motion abnormalities. There is moderate concentric left ventricular hypertrophy (mild LVH at best- independently reviewed). Left ventricular diastolic parameters are indeterminate. 2. Right ventricular systolic function is normal. The right ventricular size is normal.  3. The mitral valve is normal in structure. No evidence of mitral valve regurgitation. No evidence of mitral stenosis. 4. The aortic valve is normal in structure. Aortic valve regurgitation is not visualized. No aortic stenosis is present.   Cardiac monitor (Zio Patch): 04/26/2024 - 05/04/2024 Dominant rhythm sinus.  Tachycardia burden 22%.  Heart rate 59-162 bpm. Avg HR 92 bpm. No atrial fibrillation detected during the monitoring period. No supraventricular tachycardia, high grade AV block, pauses (3 seconds or longer). Total supraventricular ectopic burden <1%. Total ventricular ectopic burden <1%. 1 auto triggered episode of NSVT. Patient triggered events: 11.  Either sinus or sinus tachycardia with baseline artifact (affecting accuracy).   RADIOLOGY: NA  Risk Assessment/Calculations:   NA  Labs:       Latest Ref Rng & Units 08/09/2023    6:30 AM 03/02/2022    3:40 PM 02/11/2022    7:54 PM  CBC  WBC 4.0 - 10.5 K/uL 6.4  8.4  8.7   Hemoglobin 12.0 - 15.0 g/dL 86.9  87.0  86.2   Hematocrit 36.0 - 46.0 % 40.6  40.6  42.8   Platelets 150 - 400 K/uL 403  375  401  Latest Ref Rng & Units 08/09/2023    6:30 AM 03/02/2022    3:40 PM 02/11/2022    7:54 PM  BMP  Glucose 70 - 99 mg/dL 884  897  885   BUN 6 - 20 mg/dL 6  9  11    Creatinine 0.44 - 1.00 mg/dL 9.18  9.24  9.30   Sodium 135 - 145 mmol/L 142  141  141    Potassium 3.5 - 5.1 mmol/L 3.4  4.0  4.0   Chloride 98 - 111 mmol/L 108  111  110   CO2 22 - 32 mmol/L 23  23  19    Calcium  8.9 - 10.3 mg/dL 8.7  9.3  9.2       Latest Ref Rng & Units 08/09/2023    6:30 AM 03/02/2022    3:40 PM 02/11/2022    7:54 PM  CMP  Glucose 70 - 99 mg/dL 884  897  885   BUN 6 - 20 mg/dL 6  9  11    Creatinine 0.44 - 1.00 mg/dL 9.18  9.24  9.30   Sodium 135 - 145 mmol/L 142  141  141   Potassium 3.5 - 5.1 mmol/L 3.4  4.0  4.0   Chloride 98 - 111 mmol/L 108  111  110   CO2 22 - 32 mmol/L 23  23  19    Calcium  8.9 - 10.3 mg/dL 8.7  9.3  9.2   Total Protein 6.5 - 8.1 g/dL   7.1   Total Bilirubin 0.3 - 1.2 mg/dL   0.7   Alkaline Phos 38 - 126 U/L   68   AST 15 - 41 U/L   18   ALT 0 - 44 U/L   13     Lab Results  Component Value Date   CHOL 194 02/11/2022   HDL 52 02/11/2022   LDLCALC 105 (H) 02/11/2022   TRIG 183 (H) 02/11/2022   CHOLHDL 3.7 02/11/2022   No results for input(s): LIPOA in the last 8760 hours. No components found for: NTPROBNP No results for input(s): PROBNP in the last 8760 hours. No results for input(s): TSH in the last 8760 hours.  Physical Exam:    Today's Vitals   08/01/24 1500  BP: (!) 142/91  Pulse: 98  Resp: 16  SpO2: 95%  Weight: (!) 427 lb 9.6 oz (194 kg)  Height: 5' 7 (1.702 m)    Body mass index is 66.97 kg/m. Wt Readings from Last 3 Encounters:  08/01/24 (!) 427 lb 9.6 oz (194 kg)  04/17/24 (!) 419 lb 12.8 oz (190.4 kg)  08/09/23 (!) 428 lb (194.1 kg)    Physical Exam  Constitutional: No distress.  hemodynamically stable  Neck: No JVD present.  Cardiovascular: Normal rate, regular rhythm, S1 normal and S2 normal. Exam reveals no gallop, no S3 and no S4.  No murmur heard. Pulmonary/Chest: Effort normal and breath sounds normal. No stridor. She has no wheezes. She has no rales.  Musculoskeletal:        General: No edema.     Cervical back: Neck supple.  Skin: Skin is warm.   Impression &  Recommendation(s):  Impression:   ICD-10-CM   1. Palpitations  R00.2     2. Precordial pain  R07.2     3. Essential hypertension  I10     4. Sleep apnea in adult  G47.30     5. Pure hypertriglyceridemia  E78.1     6. Mixed hyperlipidemia  E78.2     7. Class 3 severe obesity due to excess calories with serious comorbidity and body mass index (BMI) of 60.0 to 69.9 in adult [facility])  Z33.186    Z68.44       Recommendation(s):     Essential hypertension Blood pressure consistently elevated.  Current medications: losartan  25 mg po qday, amlodipine  10 mg po qday.  Discussed potential switch to carvedilol for hypertension and palpitations.  Explained risks of uncontrolled hypertension: stroke, heart failure, dialysis. - Provided prescription for home blood pressure cuff. - Instructed to monitor blood pressure at home three times a week, morning and night, after resting for 10-20 minutes. - Advised follow-up with primary care provider if home readings remain high for medication adjustment. Goal SBP <130 mmHg.  Palpitations and precordial pain Intermittent palpitations and non-cardiac chest pain.  Normal cardiac function on echocardiogram Symptoms may be exacerbated by anxiety and stress.  No sustained arrhythmias detected. - Continue to monitor symptoms and seek medical attention if chest pain worsens or becomes more frequent. - Consider stress management techniques, such as breathing exercises.  Sleep apnea Non-compliance with CPAP therapy. Discussed importance of regular CPAP use to prevent complications. - Encouraged regular use of CPAP machine.     Class 3 severe obesity due to excess calories with serious comorbidity and body mass index (BMI) of 60.0 to 69.9 Body mass index is 66.97 kg/m. Patient stated she was prescribed Mounjaro by PCP but has not started it. Patient is status post gastric sleeve I reviewed with her importance of diet, regular physical activity/exercise,  weight loss.   Patient is educated on the importance of increasing physical activity gradually as tolerated with a goal of moderate intensity exercise for 30 minutes a day 5 days a week.   Orders Placed:  No orders of the defined types were placed in this encounter.    Final Medication List:    Meds ordered this encounter  Medications   Blood Pressure Monitoring (OMRON 3 SERIES BP MONITOR) DEVI    Sig: Use as directed to monitor blood pressure daily.    Dispense:  1 each    Refill:  0     Current Outpatient Medications:    Blood Pressure Monitoring (OMRON 3 SERIES BP MONITOR) DEVI, Use as directed to monitor blood pressure daily., Disp: 1 each, Rfl: 0   acetaminophen  (TYLENOL ) 325 MG tablet, Take 2 tablets (650 mg total) by mouth every 6 (six) hours. (Patient taking differently: Take 650 mg by mouth every 6 (six) hours as needed for mild pain (pain score 1-3) or moderate pain (pain score 4-6).), Disp: , Rfl:    amLODipine  (NORVASC ) 10 MG tablet, Take 1 tablet (10 mg total) by mouth daily. MUST MAKE APPT FOR FURTHER REFILLS, Disp: 30 tablet, Rfl: 0   atorvastatin  (LIPITOR) 20 MG tablet, Take 1 tablet (20 mg total) by mouth daily., Disp: 30 tablet, Rfl: 3   diphenhydramine-acetaminophen  (TYLENOL  PM) 25-500 MG TABS tablet, Take 2 tablets by mouth at bedtime as needed (Headache)., Disp: , Rfl:    DULoxetine (CYMBALTA) 20 MG capsule, Take 20 mg by mouth daily., Disp: , Rfl:    fluticasone (FLONASE) 50 MCG/ACT nasal spray, Place 1 spray into both nostrils daily as needed for allergies., Disp: , Rfl:    levothyroxine  (SYNTHROID , LEVOTHROID) 50 MCG tablet, Take 1 tablet (50 mcg total) by mouth daily., Disp: 30 tablet, Rfl: 5   losartan  (COZAAR ) 25 MG tablet, Take 25 mg by mouth daily., Disp: ,  Rfl:    medroxyPROGESTERone (PROVERA) 10 MG tablet, Take 20 mg by mouth See admin instructions. Use 12 days a month a month every month, Disp: , Rfl:    MELATONIN GUMMIES PO, Take 2 tablets by mouth daily  as needed (Sleep)., Disp: , Rfl:    Multiple Vitamin (MULTIVITAMIN WITH MINERALS) TABS tablet, Take 1 tablet by mouth daily., Disp: , Rfl:    VRAYLAR  3 MG capsule, Take 3 mg by mouth daily., Disp: , Rfl:   Consent:   NA  Disposition:   As needed  Her questions and concerns were addressed to her satisfaction. She voices understanding of the recommendations provided during this encounter.    Signed, Madonna Michele HAS, [facility]  A Division of [facility] [address] [place], [place] [address]  08/01/2024 5:18 PM "

## 2024-08-07 ENCOUNTER — Telehealth: Payer: Self-pay | Admitting: Cardiology

## 2024-08-07 ENCOUNTER — Encounter: Payer: Self-pay | Admitting: Cardiology

## 2024-08-07 NOTE — Telephone Encounter (Signed)
 Can you follow up on this?

## 2024-08-07 NOTE — Telephone Encounter (Signed)
 Ariel from [facility] is calling to verify patient chronic status

## 2024-08-07 NOTE — Telephone Encounter (Signed)
 Unable to get through to the correct person or enter ref number as it was all automated and kept circling. Mychart message was sent to covering per last encounter.

## 2024-08-10 ENCOUNTER — Telehealth: Payer: Self-pay | Admitting: Cardiology

## 2024-08-10 NOTE — Telephone Encounter (Signed)
 Left call back number but requested pt ID to do so. Will forward to HIM for advisement.

## 2024-08-10 NOTE — Telephone Encounter (Signed)
 [facility] calling to verify if pt has chronic condition. Please advise.

## 2024-08-11 ENCOUNTER — Other Ambulatory Visit (HOSPITAL_COMMUNITY): Payer: Self-pay

## 2024-11-16 ENCOUNTER — Ambulatory Visit: Admitting: Dermatology
